# Patient Record
Sex: Male | Born: 1935 | Race: White | Hispanic: No | Marital: Married | Smoking: Former smoker
Health system: Southern US, Community
[De-identification: ages and names within clinical notes are randomized; demographics above are authoritative.]

## PROBLEM LIST (undated history)

## (undated) DIAGNOSIS — K635 Polyp of colon: Secondary | ICD-10-CM

## (undated) DIAGNOSIS — R053 Chronic cough: Secondary | ICD-10-CM

## (undated) DIAGNOSIS — K279 Peptic ulcer, site unspecified, unspecified as acute or chronic, without hemorrhage or perforation: Secondary | ICD-10-CM

## (undated) DIAGNOSIS — K219 Gastro-esophageal reflux disease without esophagitis: Secondary | ICD-10-CM

## (undated) DIAGNOSIS — K2270 Barrett's esophagus without dysplasia: Secondary | ICD-10-CM

## (undated) DIAGNOSIS — K5790 Diverticulosis of intestine, part unspecified, without perforation or abscess without bleeding: Secondary | ICD-10-CM

## (undated) DIAGNOSIS — N183 Chronic kidney disease, stage 3 unspecified: Secondary | ICD-10-CM

## (undated) DIAGNOSIS — J449 Chronic obstructive pulmonary disease, unspecified: Secondary | ICD-10-CM

## (undated) DIAGNOSIS — E875 Hyperkalemia: Secondary | ICD-10-CM

## (undated) DIAGNOSIS — I2510 Atherosclerotic heart disease of native coronary artery without angina pectoris: Secondary | ICD-10-CM

## (undated) DIAGNOSIS — Z8679 Personal history of other diseases of the circulatory system: Secondary | ICD-10-CM

## (undated) DIAGNOSIS — J189 Pneumonia, unspecified organism: Secondary | ICD-10-CM

## (undated) DIAGNOSIS — E785 Hyperlipidemia, unspecified: Secondary | ICD-10-CM

## (undated) DIAGNOSIS — I10 Essential (primary) hypertension: Secondary | ICD-10-CM

## (undated) DIAGNOSIS — R05 Cough: Secondary | ICD-10-CM

## (undated) HISTORY — DX: Hyperkalemia: E87.5

## (undated) HISTORY — PX: CARDIAC SURGERY: SHX584

## (undated) HISTORY — DX: Peptic ulcer, site unspecified, unspecified as acute or chronic, without hemorrhage or perforation: K27.9

## (undated) HISTORY — DX: Chronic cough: R05.3

## (undated) HISTORY — DX: Polyp of colon: K63.5

## (undated) HISTORY — DX: Gastro-esophageal reflux disease without esophagitis: K21.9

## (undated) HISTORY — DX: Hyperlipidemia, unspecified: E78.5

## (undated) HISTORY — DX: Chronic obstructive pulmonary disease, unspecified: J44.9

## (undated) HISTORY — DX: Diverticulosis of intestine, part unspecified, without perforation or abscess without bleeding: K57.90

## (undated) HISTORY — DX: Atherosclerotic heart disease of native coronary artery without angina pectoris: I25.10

## (undated) HISTORY — DX: Personal history of other diseases of the circulatory system: Z86.79

## (undated) HISTORY — DX: Chronic kidney disease, stage 3 (moderate): N18.3

## (undated) HISTORY — DX: Chronic kidney disease, stage 3 unspecified: N18.30

## (undated) HISTORY — DX: Barrett's esophagus without dysplasia: K22.70

## (undated) HISTORY — DX: Pneumonia, unspecified organism: J18.9

## (undated) HISTORY — DX: Cough: R05

## (undated) HISTORY — PX: LAPAROSCOPIC GASTROTOMY W/ REPAIR OF ULCER: SUR772

## (undated) HISTORY — PX: COLON SURGERY: SHX602

---

## 2001-11-03 ENCOUNTER — Ambulatory Visit (HOSPITAL_COMMUNITY): Admission: RE | Admit: 2001-11-03 | Discharge: 2001-11-03 | Payer: Self-pay | Admitting: Internal Medicine

## 2004-03-30 ENCOUNTER — Ambulatory Visit (HOSPITAL_COMMUNITY): Admission: RE | Admit: 2004-03-30 | Discharge: 2004-03-30 | Payer: Self-pay | Admitting: Internal Medicine

## 2004-08-04 ENCOUNTER — Ambulatory Visit (HOSPITAL_COMMUNITY): Admission: RE | Admit: 2004-08-04 | Discharge: 2004-08-04 | Payer: Self-pay | Admitting: Family Medicine

## 2004-08-11 ENCOUNTER — Ambulatory Visit (HOSPITAL_COMMUNITY): Admission: RE | Admit: 2004-08-11 | Discharge: 2004-08-11 | Payer: Self-pay | Admitting: Family Medicine

## 2004-08-31 ENCOUNTER — Ambulatory Visit (HOSPITAL_COMMUNITY): Admission: RE | Admit: 2004-08-31 | Discharge: 2004-08-31 | Payer: Self-pay | Admitting: Pulmonary Disease

## 2005-09-30 ENCOUNTER — Ambulatory Visit: Payer: Self-pay | Admitting: Internal Medicine

## 2005-10-22 ENCOUNTER — Ambulatory Visit: Payer: Self-pay | Admitting: Internal Medicine

## 2005-10-22 ENCOUNTER — Encounter: Payer: Self-pay | Admitting: Internal Medicine

## 2005-10-22 ENCOUNTER — Ambulatory Visit (HOSPITAL_COMMUNITY): Admission: RE | Admit: 2005-10-22 | Discharge: 2005-10-22 | Payer: Self-pay | Admitting: Internal Medicine

## 2007-09-04 ENCOUNTER — Ambulatory Visit (HOSPITAL_COMMUNITY): Admission: RE | Admit: 2007-09-04 | Discharge: 2007-09-04 | Payer: Self-pay | Admitting: Internal Medicine

## 2007-09-04 ENCOUNTER — Encounter: Payer: Self-pay | Admitting: Internal Medicine

## 2007-09-04 ENCOUNTER — Ambulatory Visit: Payer: Self-pay | Admitting: Internal Medicine

## 2007-12-22 ENCOUNTER — Encounter: Payer: Self-pay | Admitting: Internal Medicine

## 2007-12-22 ENCOUNTER — Ambulatory Visit (HOSPITAL_COMMUNITY): Admission: RE | Admit: 2007-12-22 | Discharge: 2007-12-22 | Payer: Self-pay | Admitting: Internal Medicine

## 2007-12-22 ENCOUNTER — Ambulatory Visit: Payer: Self-pay | Admitting: Internal Medicine

## 2008-02-07 ENCOUNTER — Ambulatory Visit (HOSPITAL_COMMUNITY): Admission: RE | Admit: 2008-02-07 | Discharge: 2008-02-07 | Payer: Self-pay | Admitting: Family Medicine

## 2008-02-13 ENCOUNTER — Encounter: Payer: Self-pay | Admitting: Cardiology

## 2008-02-13 ENCOUNTER — Ambulatory Visit (HOSPITAL_COMMUNITY): Admission: RE | Admit: 2008-02-13 | Discharge: 2008-02-13 | Payer: Self-pay | Admitting: Family Medicine

## 2008-02-13 ENCOUNTER — Encounter: Payer: Self-pay | Admitting: Pulmonary Disease

## 2008-03-18 HISTORY — PX: CORONARY ARTERY BYPASS GRAFT: SHX141

## 2008-04-10 ENCOUNTER — Encounter: Payer: Self-pay | Admitting: Cardiology

## 2008-04-10 ENCOUNTER — Inpatient Hospital Stay (HOSPITAL_COMMUNITY): Admission: AD | Admit: 2008-04-10 | Discharge: 2008-04-21 | Payer: Self-pay | Admitting: Cardiology

## 2008-04-10 ENCOUNTER — Ambulatory Visit: Payer: Self-pay | Admitting: Thoracic Surgery (Cardiothoracic Vascular Surgery)

## 2008-04-10 ENCOUNTER — Ambulatory Visit: Payer: Self-pay | Admitting: Emergency Medicine

## 2008-04-11 ENCOUNTER — Encounter: Payer: Self-pay | Admitting: Thoracic Surgery (Cardiothoracic Vascular Surgery)

## 2008-04-11 ENCOUNTER — Encounter: Payer: Self-pay | Admitting: Cardiology

## 2008-04-12 ENCOUNTER — Encounter: Payer: Self-pay | Admitting: Thoracic Surgery (Cardiothoracic Vascular Surgery)

## 2008-05-06 ENCOUNTER — Ambulatory Visit: Payer: Self-pay | Admitting: Thoracic Surgery (Cardiothoracic Vascular Surgery)

## 2008-05-06 ENCOUNTER — Encounter
Admission: RE | Admit: 2008-05-06 | Discharge: 2008-05-06 | Payer: Self-pay | Admitting: Thoracic Surgery (Cardiothoracic Vascular Surgery)

## 2008-05-21 ENCOUNTER — Emergency Department (HOSPITAL_COMMUNITY): Admission: EM | Admit: 2008-05-21 | Discharge: 2008-05-21 | Payer: Self-pay | Admitting: Emergency Medicine

## 2008-05-27 ENCOUNTER — Encounter
Admission: RE | Admit: 2008-05-27 | Discharge: 2008-05-27 | Payer: Self-pay | Admitting: Thoracic Surgery (Cardiothoracic Vascular Surgery)

## 2008-05-27 ENCOUNTER — Ambulatory Visit: Payer: Self-pay | Admitting: Thoracic Surgery (Cardiothoracic Vascular Surgery)

## 2008-06-17 ENCOUNTER — Encounter (HOSPITAL_COMMUNITY): Admission: RE | Admit: 2008-06-17 | Discharge: 2008-07-17 | Payer: Self-pay | Admitting: *Deleted

## 2008-07-18 ENCOUNTER — Encounter (HOSPITAL_COMMUNITY): Admission: RE | Admit: 2008-07-18 | Discharge: 2008-08-17 | Payer: Self-pay | Admitting: *Deleted

## 2008-08-19 ENCOUNTER — Encounter (HOSPITAL_COMMUNITY): Admission: RE | Admit: 2008-08-19 | Discharge: 2008-09-18 | Payer: Self-pay | Admitting: *Deleted

## 2008-10-28 ENCOUNTER — Ambulatory Visit (HOSPITAL_COMMUNITY): Admission: RE | Admit: 2008-10-28 | Discharge: 2008-10-28 | Payer: Self-pay | Admitting: Nephrology

## 2008-10-28 ENCOUNTER — Ambulatory Visit (HOSPITAL_COMMUNITY): Admission: RE | Admit: 2008-10-28 | Discharge: 2008-10-28 | Payer: Self-pay | Admitting: Family Medicine

## 2008-10-28 ENCOUNTER — Encounter: Payer: Self-pay | Admitting: Pulmonary Disease

## 2008-11-25 ENCOUNTER — Ambulatory Visit (HOSPITAL_COMMUNITY): Admission: RE | Admit: 2008-11-25 | Discharge: 2008-11-25 | Payer: Self-pay | Admitting: Nephrology

## 2008-12-23 ENCOUNTER — Ambulatory Visit: Payer: Self-pay | Admitting: Internal Medicine

## 2009-01-06 ENCOUNTER — Ambulatory Visit: Payer: Self-pay | Admitting: Internal Medicine

## 2009-01-06 ENCOUNTER — Ambulatory Visit (HOSPITAL_COMMUNITY): Admission: RE | Admit: 2009-01-06 | Discharge: 2009-01-06 | Payer: Self-pay | Admitting: Internal Medicine

## 2009-01-06 ENCOUNTER — Encounter: Payer: Self-pay | Admitting: Internal Medicine

## 2009-01-07 ENCOUNTER — Encounter: Payer: Self-pay | Admitting: Internal Medicine

## 2009-01-24 ENCOUNTER — Encounter: Payer: Self-pay | Admitting: Internal Medicine

## 2009-02-14 ENCOUNTER — Ambulatory Visit: Payer: Self-pay | Admitting: Pulmonary Disease

## 2009-02-14 DIAGNOSIS — R053 Chronic cough: Secondary | ICD-10-CM | POA: Insufficient documentation

## 2009-02-14 DIAGNOSIS — E785 Hyperlipidemia, unspecified: Secondary | ICD-10-CM

## 2009-02-14 DIAGNOSIS — R05 Cough: Secondary | ICD-10-CM | POA: Insufficient documentation

## 2009-02-14 DIAGNOSIS — J449 Chronic obstructive pulmonary disease, unspecified: Secondary | ICD-10-CM

## 2009-02-14 DIAGNOSIS — I10 Essential (primary) hypertension: Secondary | ICD-10-CM | POA: Insufficient documentation

## 2009-02-18 ENCOUNTER — Telehealth (INDEPENDENT_AMBULATORY_CARE_PROVIDER_SITE_OTHER): Payer: Self-pay | Admitting: *Deleted

## 2009-03-10 ENCOUNTER — Ambulatory Visit: Payer: Self-pay | Admitting: Pulmonary Disease

## 2009-03-11 ENCOUNTER — Ambulatory Visit: Payer: Self-pay | Admitting: Pulmonary Disease

## 2009-06-02 IMAGING — CR DG CHEST 1V PORT
1 series · 1 of 1 positions shown · non-contrast
Comparison: Yesterday.

CLINICAL DATA: Status post CABG.

PORTABLE CHEST - 1 VIEW

[AP]
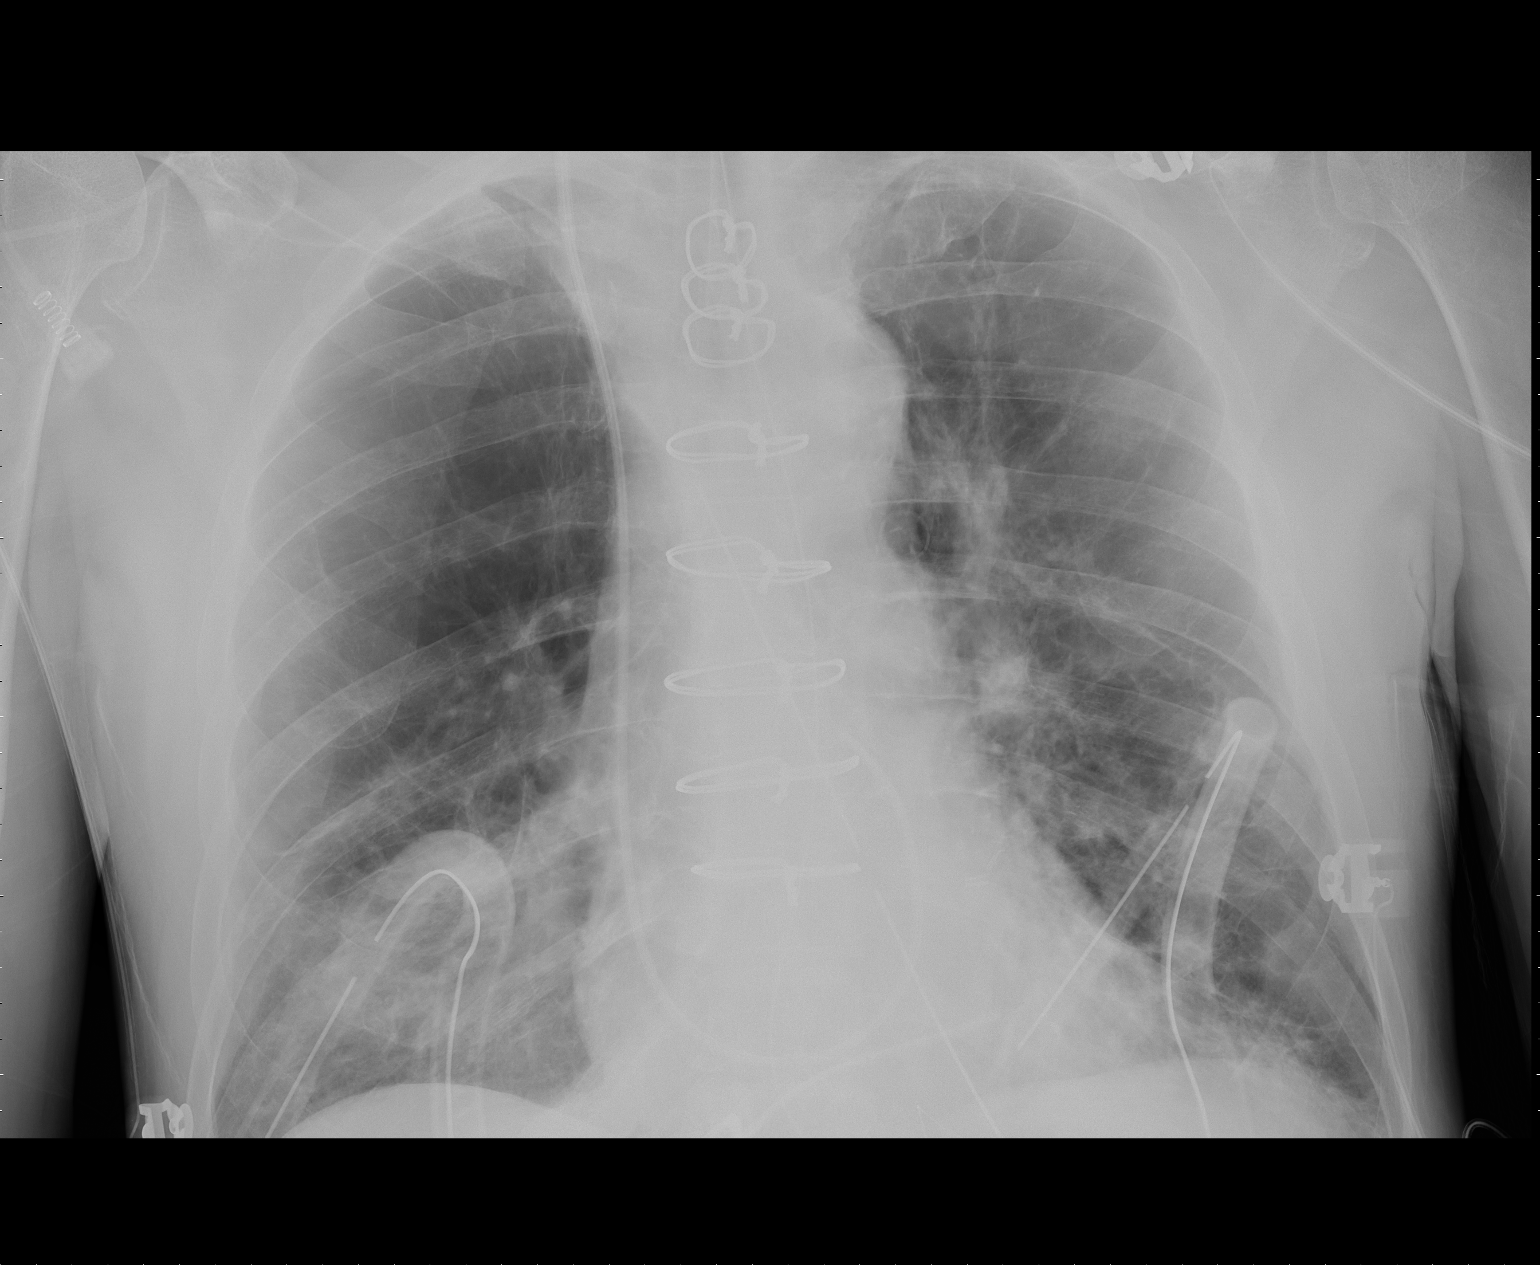

[1 of 1 positions shown; findings below may reference images not displayed]

FINDINGS: Interval post CABG changes with mediastinal and bilateral
chest tubes.  No pneumothorax.  Endotracheal tube in satisfactory
position.  Right jugular Swan-Ganz catheter tip in the region of
the pulmonary outflow tract.  Mild bibasilar atelectasis.  Stable
changes of COPD.  Diffuse osteopenia.  Nasogastric tube extending
into the stomach.
IMPRESSION: 1.  Status post CABG with no complicating features.
2.  Mild bibasilar atelectasis.
3.  Stable changes of COPD.

## 2009-06-05 IMAGING — CR DG CHEST 2V
2 series · 2 of 2 positions shown · non-contrast
Comparison: Portable chest of 04/14/2008

CLINICAL DATA: Short of breath, coronary artery disease

CHEST - 2 VIEW

[w chest pa]
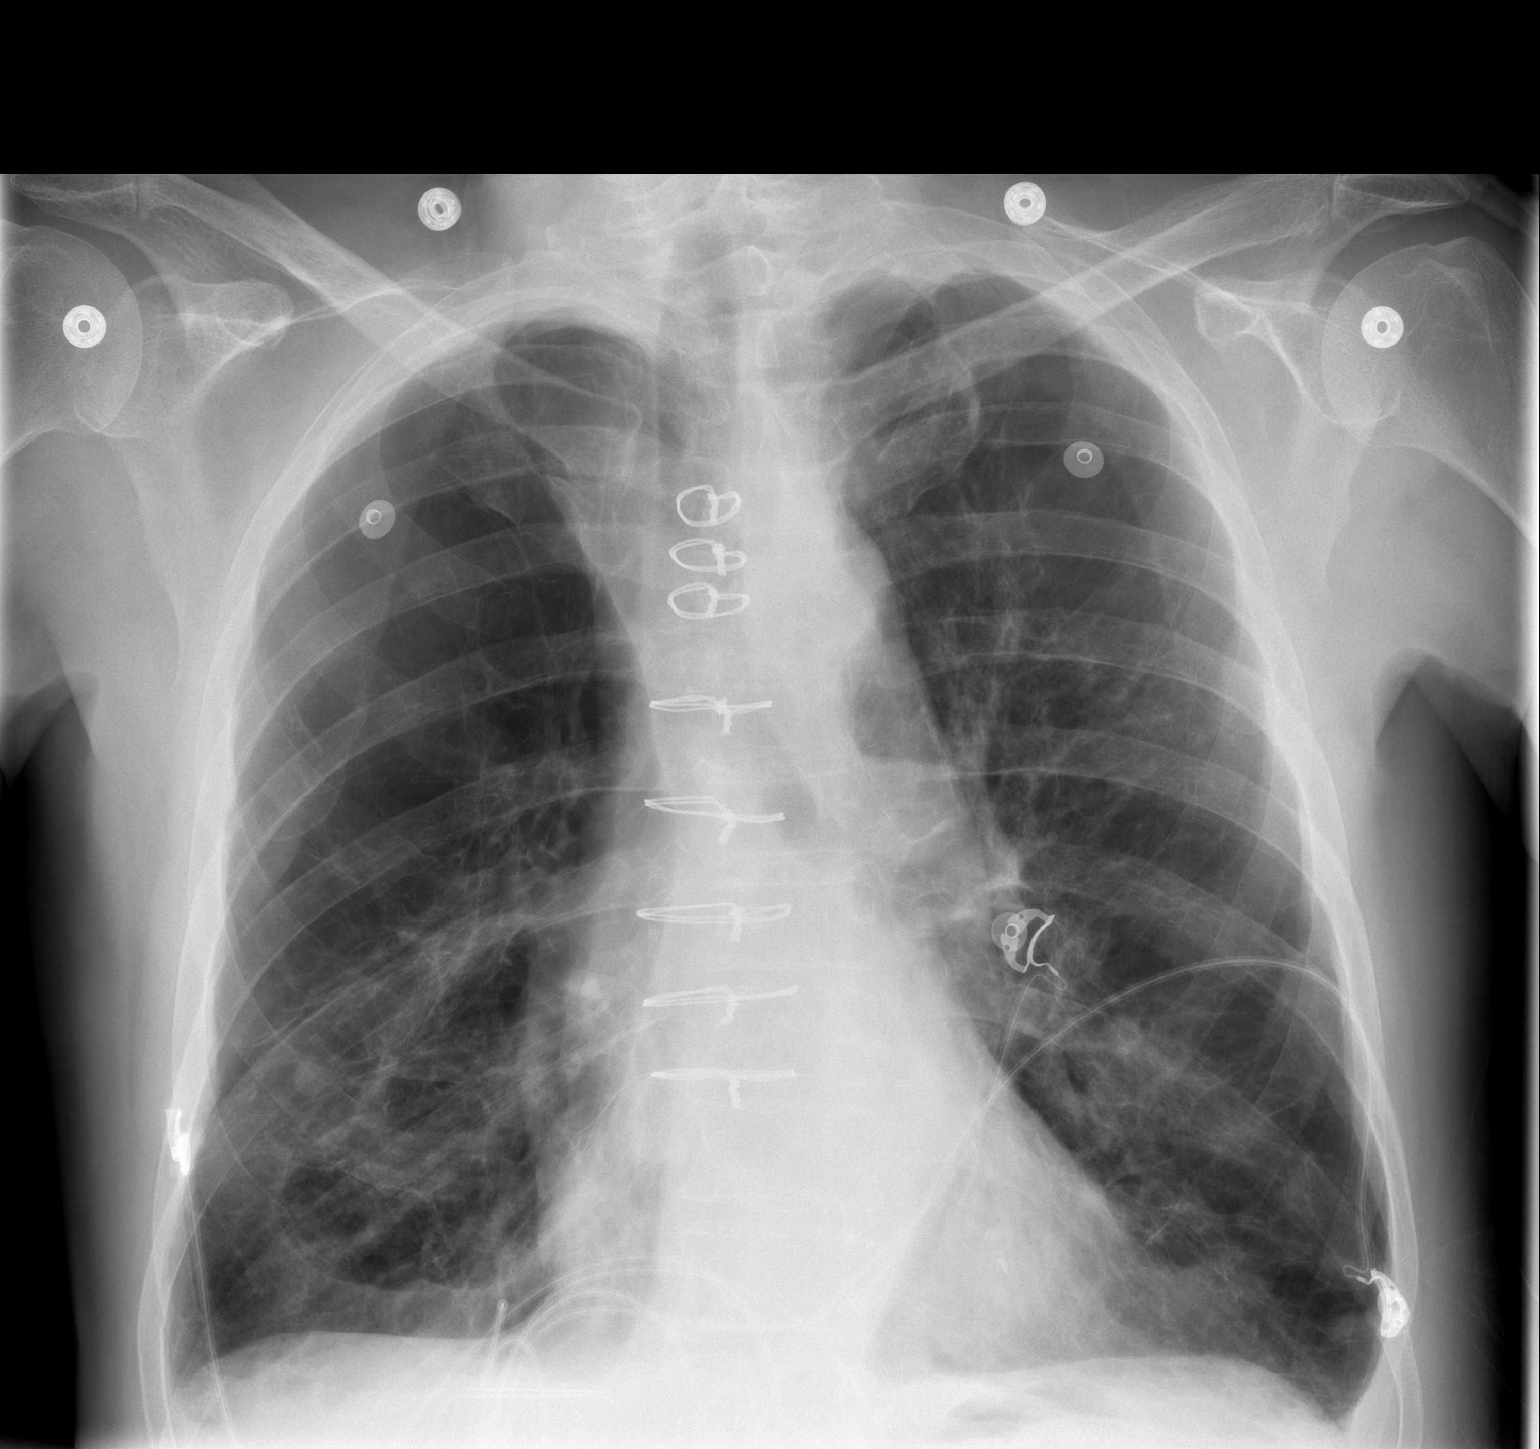

[w chest lat]
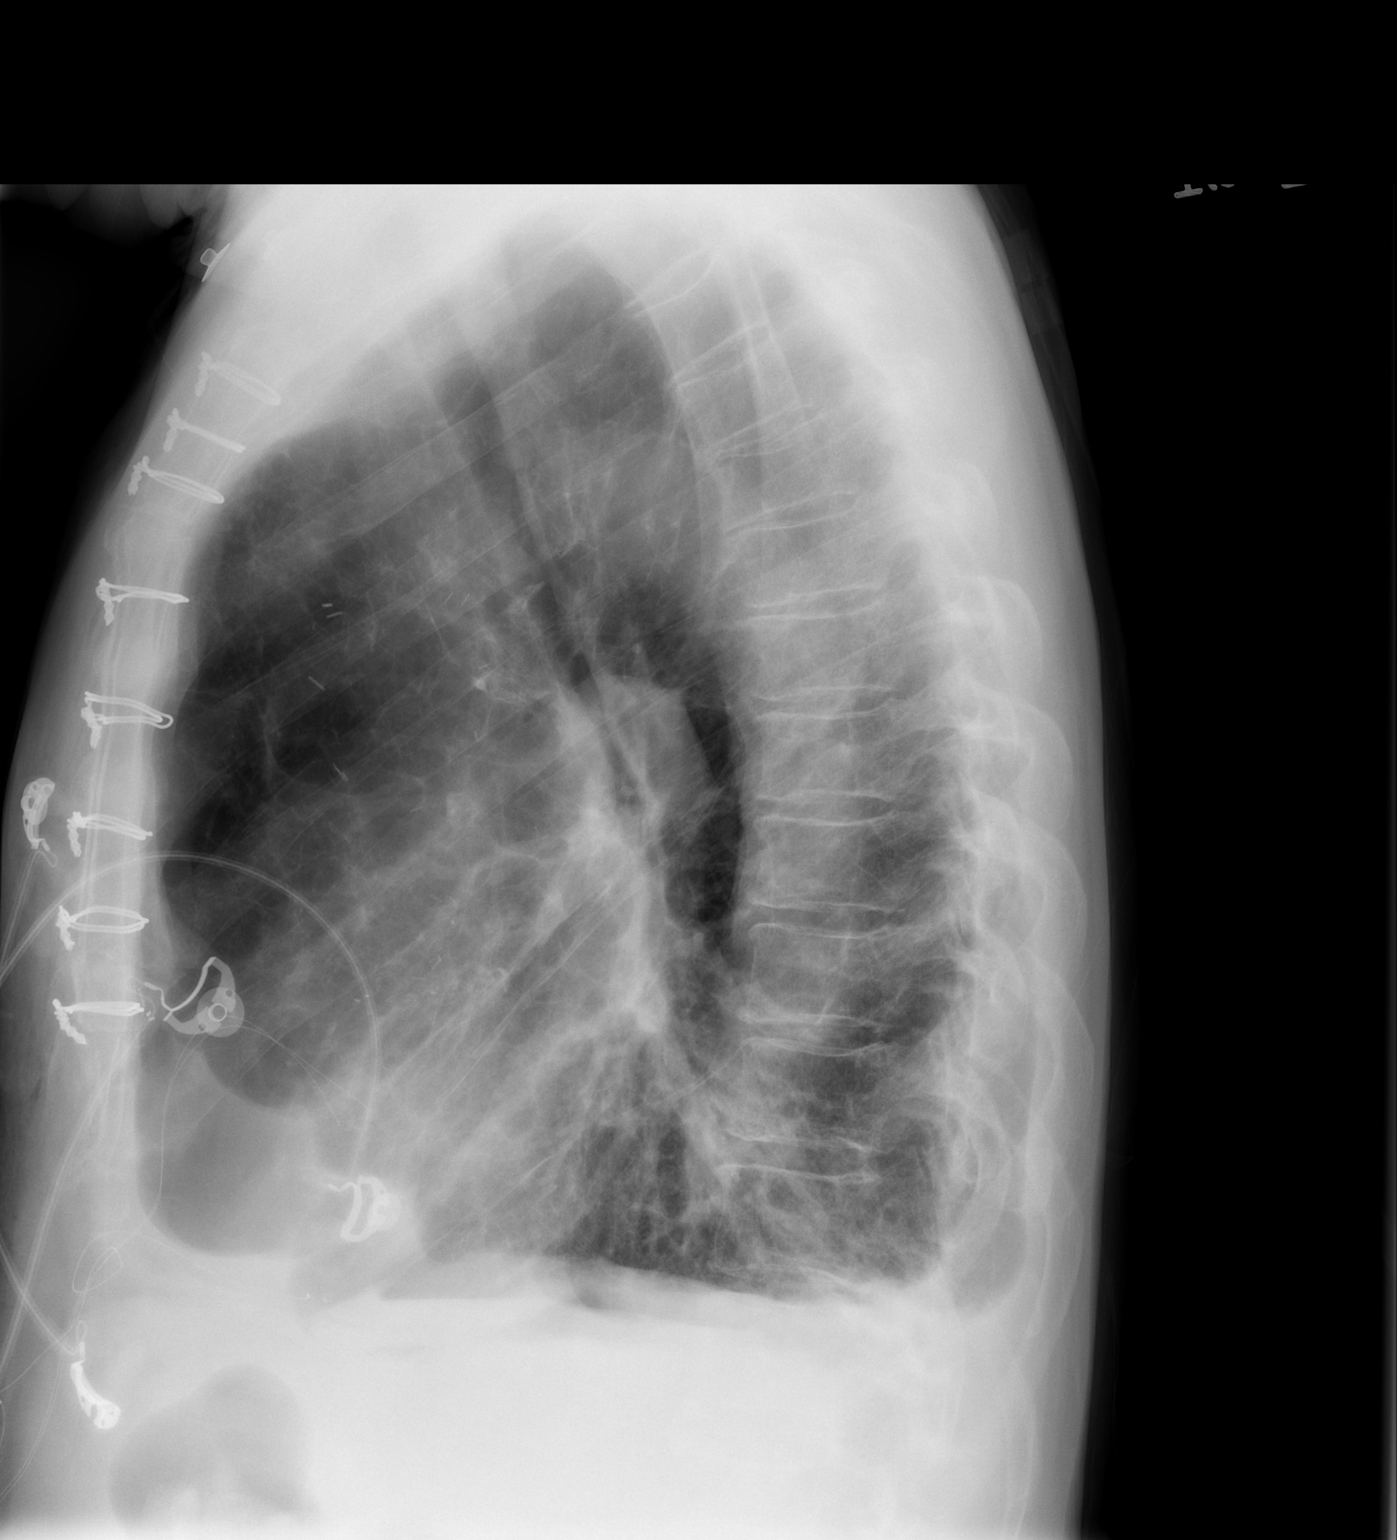

[2 of 2 positions shown; findings below may reference images not displayed]

FINDINGS: The lungs remain hyperaerated consistent with COPD.
However there are small pleural effusions present and an element of
mild congestive heart failure is a consideration.  The heart is
within upper limits of normal.  Median sternotomy sutures are noted
from prior CABG.
IMPRESSION: 1.  COPD.
2.  Small pleural effusions.  Question mild CHF.

## 2009-06-26 IMAGING — CR DG CHEST 2V
2 series · 2 of 2 positions shown · non-contrast
Comparison: 04/15/2008

CLINICAL DATA: Post CABG/short of breath

CHEST - 2 VIEW

[view not recorded (1 of 2)]
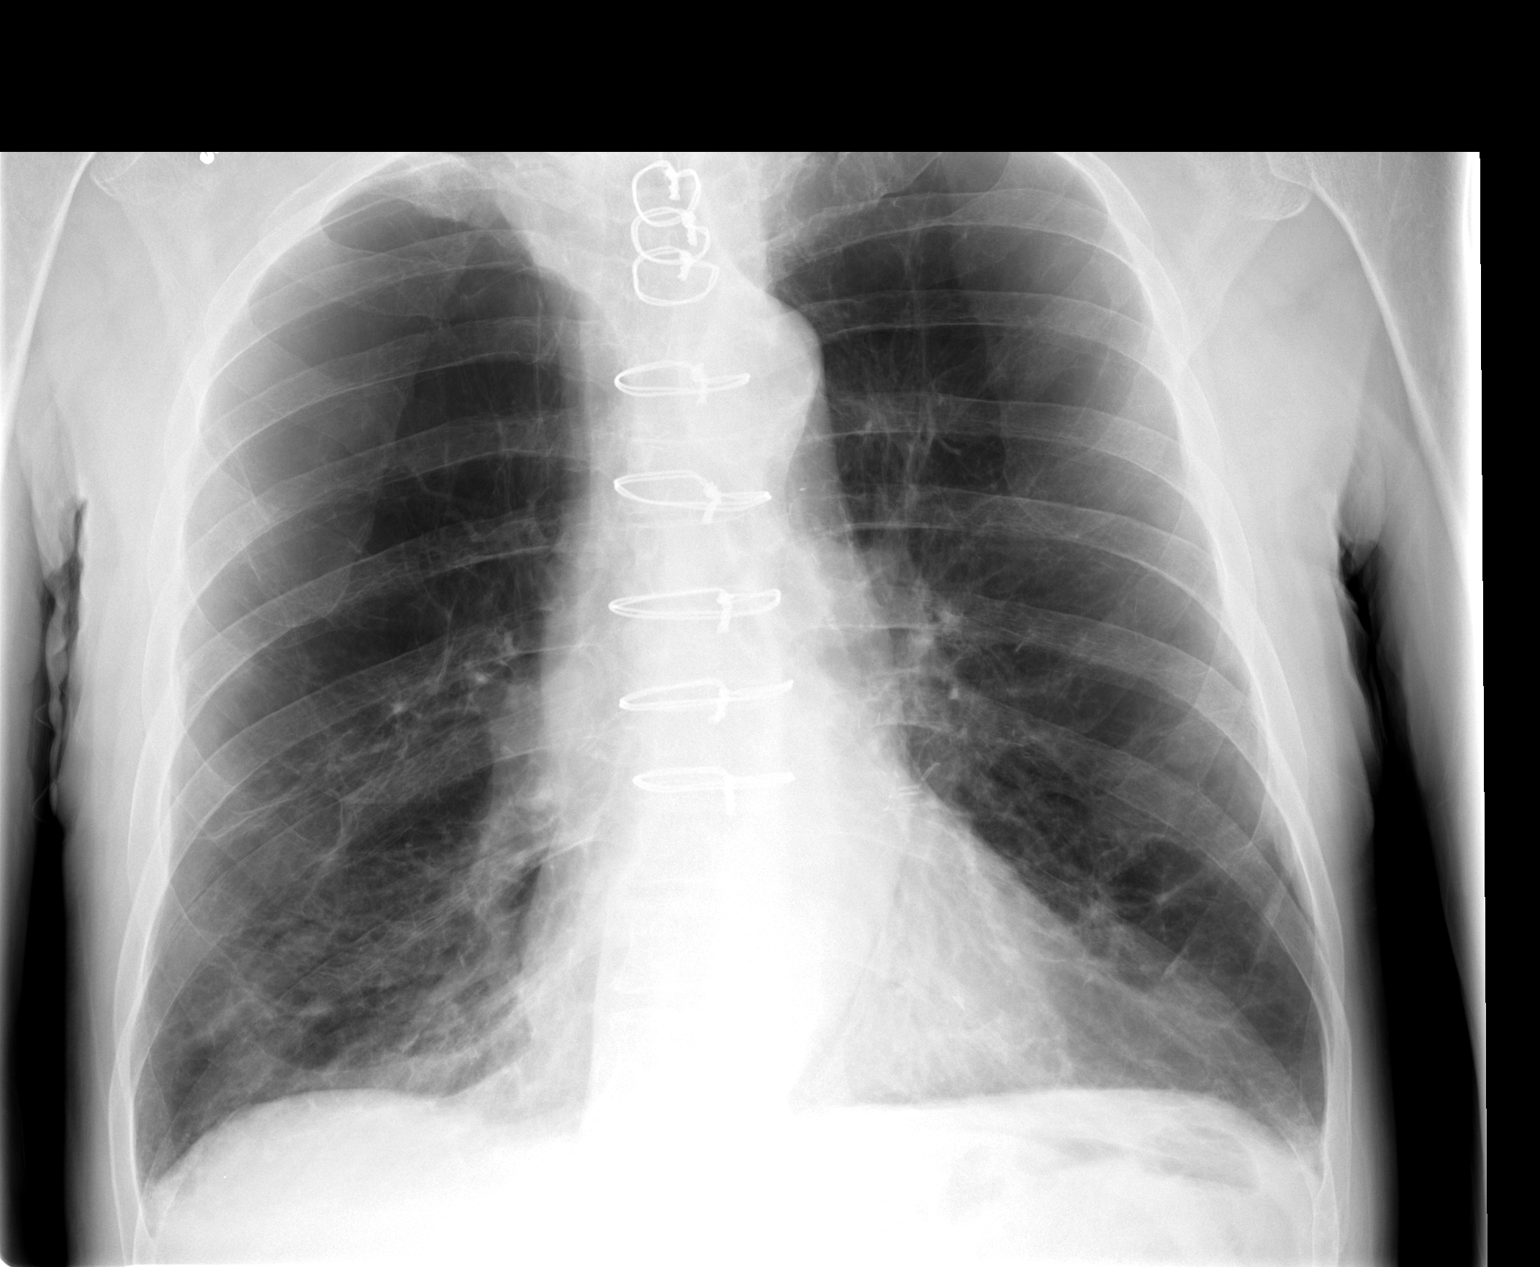

[view not recorded (2 of 2)]
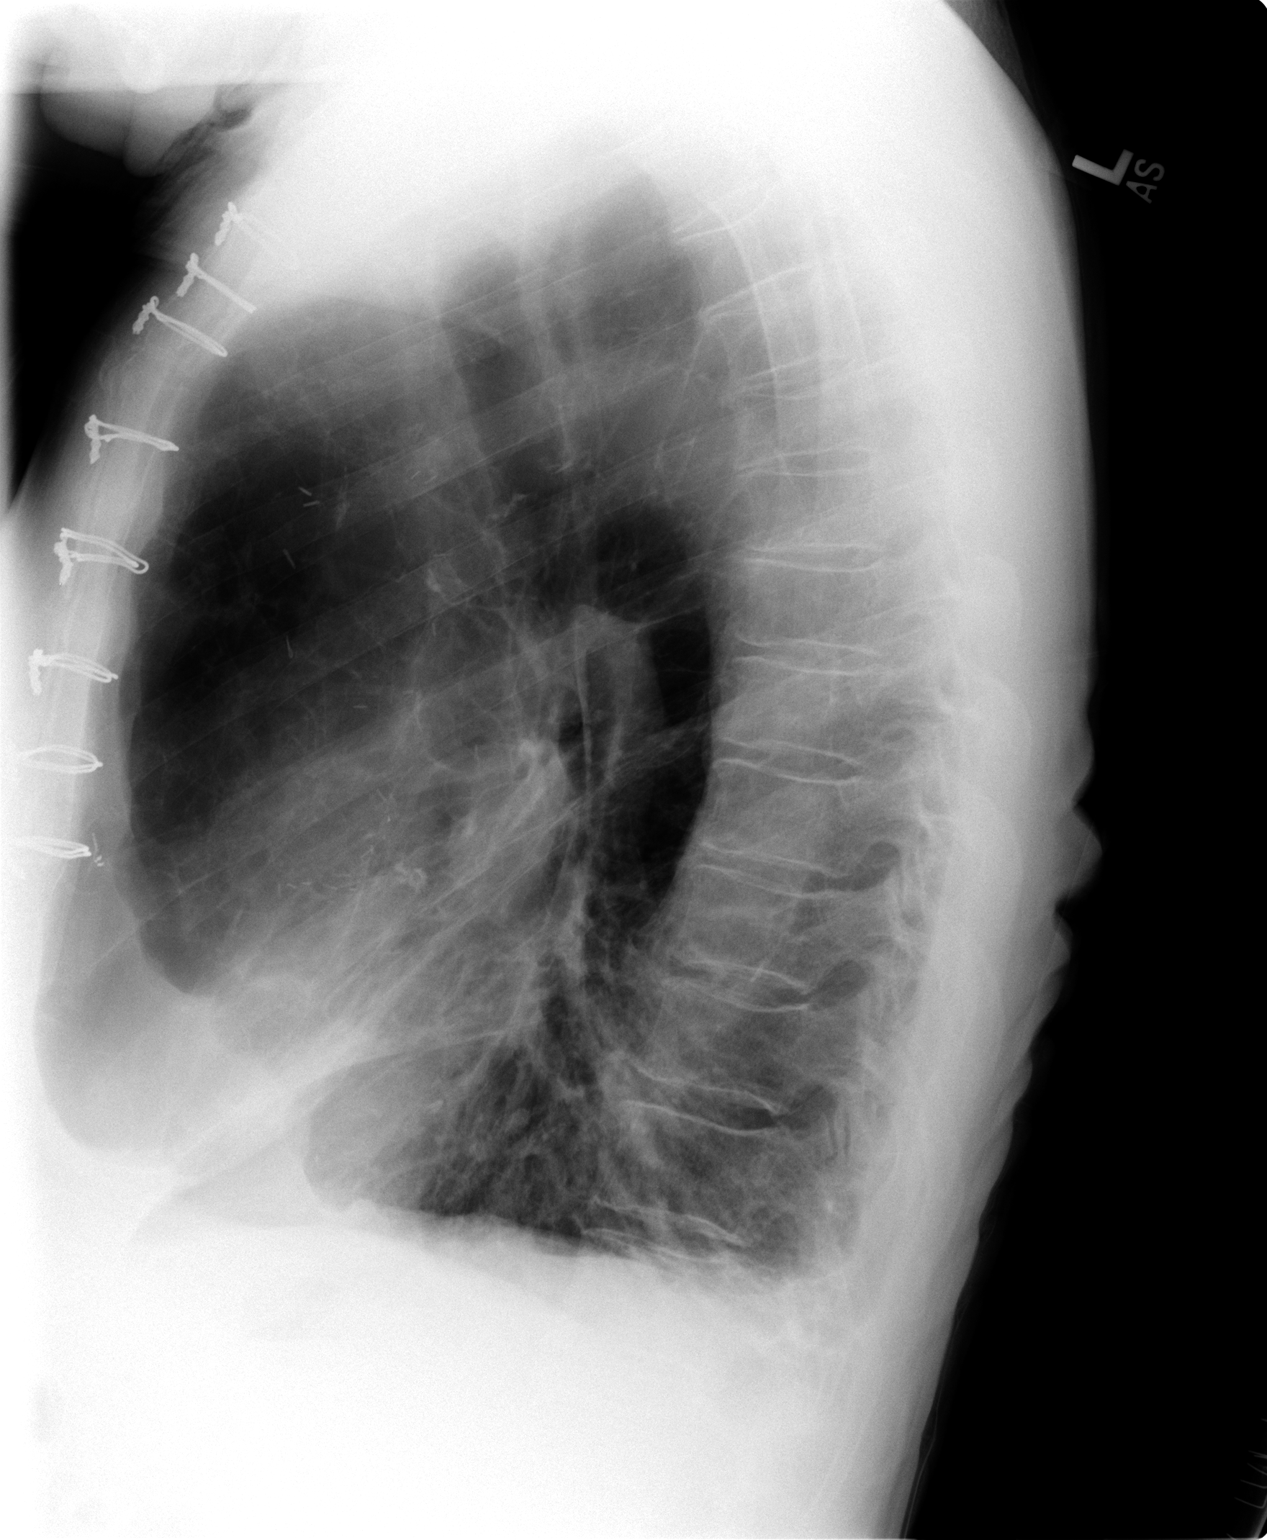

[2 of 2 positions shown; findings below may reference images not displayed]

FINDINGS: COPD as before.  No definite pneumonia but there is a
question of a subtle density in the right cardiophrenic angle on
the PA view.  On the lateral view there appears to be some slight
increased density overlying what might be the right middle lobe.
Cannot rule out subtle pneumonia.

No heart failure.  Prior median sternotomy.
IMPRESSION: 1.  COPD.
2.  Cannot exclude a subtle right middle or right lower lobe
pneumonia.
3.  No congestive heart failure - post CABG.

## 2009-07-11 ENCOUNTER — Ambulatory Visit: Payer: Self-pay | Admitting: Pulmonary Disease

## 2009-09-22 ENCOUNTER — Ambulatory Visit (HOSPITAL_COMMUNITY): Admission: RE | Admit: 2009-09-22 | Discharge: 2009-09-22 | Payer: Self-pay | Admitting: Family Medicine

## 2009-10-01 ENCOUNTER — Emergency Department (HOSPITAL_COMMUNITY): Admission: EM | Admit: 2009-10-01 | Discharge: 2009-10-01 | Payer: Self-pay | Admitting: Emergency Medicine

## 2009-10-04 ENCOUNTER — Emergency Department (HOSPITAL_COMMUNITY): Admission: EM | Admit: 2009-10-04 | Discharge: 2009-10-05 | Payer: Self-pay | Admitting: Emergency Medicine

## 2009-10-20 ENCOUNTER — Encounter: Payer: Self-pay | Admitting: Gastroenterology

## 2009-10-22 ENCOUNTER — Ambulatory Visit: Payer: Self-pay | Admitting: Pulmonary Disease

## 2009-10-22 DIAGNOSIS — J209 Acute bronchitis, unspecified: Secondary | ICD-10-CM

## 2009-11-21 ENCOUNTER — Ambulatory Visit: Payer: Self-pay | Admitting: Internal Medicine

## 2009-12-01 ENCOUNTER — Telehealth (INDEPENDENT_AMBULATORY_CARE_PROVIDER_SITE_OTHER): Payer: Self-pay | Admitting: *Deleted

## 2010-01-05 ENCOUNTER — Telehealth (INDEPENDENT_AMBULATORY_CARE_PROVIDER_SITE_OTHER): Payer: Self-pay | Admitting: *Deleted

## 2010-01-12 ENCOUNTER — Encounter: Payer: Self-pay | Admitting: Internal Medicine

## 2010-01-20 ENCOUNTER — Ambulatory Visit: Payer: Self-pay | Admitting: Pulmonary Disease

## 2010-01-21 ENCOUNTER — Ambulatory Visit (HOSPITAL_COMMUNITY): Admission: RE | Admit: 2010-01-21 | Discharge: 2010-01-21 | Payer: Self-pay | Admitting: Internal Medicine

## 2010-01-21 ENCOUNTER — Ambulatory Visit: Payer: Self-pay | Admitting: Internal Medicine

## 2010-01-23 ENCOUNTER — Encounter: Payer: Self-pay | Admitting: Internal Medicine

## 2010-02-18 ENCOUNTER — Telehealth: Payer: Self-pay | Admitting: Pulmonary Disease

## 2010-02-19 ENCOUNTER — Telehealth: Payer: Self-pay | Admitting: Pulmonary Disease

## 2010-02-20 ENCOUNTER — Encounter: Payer: Self-pay | Admitting: Pulmonary Disease

## 2010-02-20 ENCOUNTER — Telehealth (INDEPENDENT_AMBULATORY_CARE_PROVIDER_SITE_OTHER): Payer: Self-pay | Admitting: *Deleted

## 2010-02-21 ENCOUNTER — Encounter: Payer: Self-pay | Admitting: Pulmonary Disease

## 2010-02-24 ENCOUNTER — Telehealth (INDEPENDENT_AMBULATORY_CARE_PROVIDER_SITE_OTHER): Payer: Self-pay | Admitting: *Deleted

## 2010-03-11 ENCOUNTER — Ambulatory Visit: Payer: Self-pay | Admitting: Pulmonary Disease

## 2010-05-25 ENCOUNTER — Ambulatory Visit: Payer: Self-pay | Admitting: Pulmonary Disease

## 2010-06-29 ENCOUNTER — Emergency Department (HOSPITAL_COMMUNITY)
Admission: EM | Admit: 2010-06-29 | Discharge: 2010-06-30 | Payer: Self-pay | Source: Home / Self Care | Admitting: Emergency Medicine

## 2010-07-01 ENCOUNTER — Encounter: Payer: Self-pay | Admitting: Cardiology

## 2010-07-27 ENCOUNTER — Ambulatory Visit: Payer: Self-pay | Admitting: Pulmonary Disease

## 2010-08-11 ENCOUNTER — Telehealth (INDEPENDENT_AMBULATORY_CARE_PROVIDER_SITE_OTHER): Payer: Self-pay | Admitting: *Deleted

## 2010-08-11 ENCOUNTER — Encounter: Payer: Self-pay | Admitting: Pulmonary Disease

## 2010-08-18 ENCOUNTER — Encounter: Payer: Self-pay | Admitting: Pulmonary Disease

## 2010-09-07 ENCOUNTER — Encounter: Payer: Self-pay | Admitting: Pulmonary Disease

## 2010-10-24 ENCOUNTER — Encounter: Payer: Self-pay | Admitting: Pulmonary Disease

## 2010-10-27 ENCOUNTER — Telehealth: Payer: Self-pay | Admitting: Pulmonary Disease

## 2010-11-04 ENCOUNTER — Telehealth: Payer: Self-pay | Admitting: Pulmonary Disease

## 2010-11-09 ENCOUNTER — Encounter: Payer: Self-pay | Admitting: Surgery

## 2010-11-16 ENCOUNTER — Ambulatory Visit
Admission: RE | Admit: 2010-11-16 | Discharge: 2010-11-16 | Payer: Self-pay | Source: Home / Self Care | Attending: Pulmonary Disease | Admitting: Pulmonary Disease

## 2010-11-17 ENCOUNTER — Telehealth: Payer: Self-pay | Admitting: Adult Health

## 2010-11-17 NOTE — Progress Notes (Signed)
Summary: requesting O2 at home  Phone Note Call from Patient Call back at Home Phone [phone] Call back at [phone]   Caller: Spouse Call For: [name] Summary of Call: pt's spouse states that pt has been having SOB. says while he's better today, she wants to know if dr Vassie Loll would approve an order for an O2 tank to have "on hand" when pt needs this. says pt had this before (after surgery) at home and it was a great help. i offered an appt w/ RA for this am but pt couldn't come in today per spouse. call judy [place] at home # Initial call taken by: Tivis Ringer, CNA,  Feb 18, 2010 9:09 AM  Follow-up for Phone Call        Pt wife states pt has been having increased SOB with rest and exertion "for a while now."  She denies any incresed congestion or cough just SOB. She is requesting that an order be placed for oxygen for pt to use as needed. I advised pt may need to come in to be qualified for oxygen. She states understanding but that pt cannot come in today. Pelase advise. Carron Curie CMA  Feb 18, 2010 10:05 AM   Additional Follow-up for Phone Call Additional follow up Details #1::        will ask DME to perform O2 evaluation & order O2 if qualifies. Additional Follow-up by: Comer Locket. Vassie Loll MD,  Feb 18, 2010 11:05 AM    Additional Follow-up for Phone Call Additional follow up Details #2::    Called and spoke with pt's wife. She stated that when pt was on oxygen before he was with [facility] and that is where she would like order sent. Order sent to [facility] for ono. Wife is aware that [facility] will contact her to arrange for this study to be done. Rhonda Cobb  Feb 18, 2010 11:14 AM

## 2010-11-17 NOTE — Assessment & Plan Note (Signed)
Summary: rov ///kp   Visit Type:  Follow-up Copy to:  self Primary Provider/Referring Provider:  cresenzo  CC:  Pt here for follow up.  Pt request new Rx for ProAir.  History of Present Illness: 72/M, ex- smokerfor FU of  severe  COPD. He developed a  chronic cough.since his heart surgery by [name] in 6/09. Cough drops & cough syrups only give temporary relief. Albuterol nebs & MDI are not very helpful. Barrett's esophagus 3 cm noted to be stable on recent EGD by [name]. He is seeing a nephrologist for high potassium, no ACE (-) on med list.  H/o sinus surgery, tonsilectomy CXR 8/09 bibasal atelectasis with pleural calcification rt diaphragm, severe COPD. CT chest 4/09 severe emphysema, scattered sub-cm nodules stable since 2005  5/10 >> cough almost gone! dyspnea persists PFTs >> severe obstruction, FEV1 39%, no BD response  July 11, 2009--Returns for 4 month follow up - states breathing has "has  good days and bad days"-states is using the symbicort every morning.   He is able to mow grass-rides lawnmower and still  weedeats- does rest for few minutes .   October 22, 2009 2:30 PM  flu like symptoms around [place] time. CXR was wnl, treated with avelox. Still c/o yellow phlegm, no wheezing. Denies chest pain,  orthopnea, hemoptysis, fever, n/v/d, edema, headache.    Current Medications (verified): 1)  Mucinex Maximum Strength 1200 Mg Xr12h-Tab (Guaifenesin) .... As Needed 2)  Furosemide 20 Mg Tabs (Furosemide) .... Once Daily 3)  Metoprolol Tartrate 25 Mg Tabs (Metoprolol Tartrate) .... Two Times A Day 4)  Omeprazole 20 Mg Cpdr (Omeprazole) .... One By Mouth 30 Mins Before Breakfast Daily 5)  Simvastatin 80 Mg Tabs (Simvastatin) .... At Bedtime 6)  Bayer Aspirin 325 Mg Tabs (Aspirin) .Marland Kitchen.. 1 Once Daily 7)  Proair Hfa 108 (90 Base) Mcg/act Aers (Albuterol Sulfate) .... Inhale 2 Puffs Every Four Hours As Needed 8)  Symbicort 80-4.5 Mcg/act Aero (Budesonide-Formoterol  Fumarate) .... Inhale 2 Puffs Two Times A Day 9)  Zetia .... Take 1 Tablet By Mouth Once A Day 10)  Crestor 40 Mg Tabs (Rosuvastatin Calcium) .... Take 1 Tab By Mouth At Bedtime 11)  Alprazolam 0.5 Mg Tabs (Alprazolam) .... 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  The patient denies anorexia, fever, weight loss, weight gain, vision loss, decreased hearing, hoarseness, chest pain, syncope, peripheral edema, headaches, hemoptysis, abdominal pain, melena, hematochezia, severe indigestion/heartburn, hematuria, muscle weakness, suspicious skin lesions, difficulty walking, depression, unusual weight change, and abnormal bleeding.    Vital Signs:  Patient profile:   74 year old male Height:      71 inches Weight:      153 pounds O2 Sat:      93 % on Room air Temp:     97.7 degrees F oral Pulse rate:   74 / minute BP sitting:   140 / 80  (left arm) Cuff size:   regular  Vitals Entered By: Zackery Barefoot CMA (October 22, 2009 2:21 PM)  O2 Flow:  Room air CC: Pt here for follow up.  Pt request new Rx for ProAir  Comments Medications reviewed with patient Zackery Barefoot CMA  October 22, 2009 2:22 PM    Physical Exam  Additional Exam:  Gen. Pleasant, well-nourished, in no distress, normal affect ENT - no lesions, no post nasal drip Neck: No JVD, no thyromegaly, no carotid bruits Lungs: no use of accessory muscles, no dullness to percussion, decreased without rales or rhonchi  Cardiovascular: Rhythm regular, heart sounds  normal, no murmurs or gallops, no peripheral edema Musculoskeletal: No  deformities, no cyanosis or clubbing      Impression & Recommendations:  Problem # 1:  BRONCHITIS, ACUTE (ICD-466.0)  doxy for colored phlegm, NO bspasm at present His updated medication list for this problem includes:    Mucinex Maximum Strength 1200 Mg Xr12h-tab (Guaifenesin) .Marland Kitchen... As needed    Proair Hfa 108 (90 Base) Mcg/act Aers (Albuterol sulfate) ..... Inhale 2 puffs every four hours as needed    Symbicort 80-4.5 Mcg/act Aero (Budesonide-formoterol fumarate) ..... Inhale 2 puffs two times a day    Doxycycline Hyclate 100 Mg Caps (Doxycycline hyclate) ..... Once daily    Albuterol Sulfate (2.5 Mg/3ml) 0.083% Nebu (Albuterol sulfate) .Marland Kitchen..Marland Kitchen Two times a day as needed  Orders: Est. Patient Level III ([address])  Problem # 2:  C O P D (ICD-496) symbicort two times a day Add spiriva next visit  Medications Added to Medication List This Visit: 1)  Zetia  .... Take 1 tablet by mouth once a day 2)  Crestor 40 Mg Tabs (Rosuvastatin calcium) .... Take 1 tab by mouth at bedtime 3)  Alprazolam 0.5 Mg Tabs (Alprazolam) .... Take 1 tab by mouth at bedtime 4)  Doxycycline Hyclate 100 Mg Caps (Doxycycline hyclate) .... Once daily 5)  Albuterol Sulfate (2.5 Mg/23ml) 0.083% Nebu (Albuterol sulfate) .... Two times a day as needed  Patient Instructions: 1)  Copy sent to:Dr Cresenzo 2)  Please schedule a follow-up appointment in 1 month with TP 3)  Take antibiotic x 10 days Prescriptions: PROAIR HFA 108 (90 BASE) MCG/ACT AERS (ALBUTEROL SULFATE) Inhale 2 puffs every four hours as needed  #1 x 5   Entered and Authorized by:   Comer Locket Vassie Loll MD   Signed by:   Comer Locket Vassie Loll MD on 10/22/2009   Method used:   [facility]  [place] [address] [facility]       [address]       [place], [place]  [address]       Ph: [phone] or [phone]       Fax: [phone]   RxID:   [id number] ALBUTEROL SULFATE (2.5 MG/3ML) 0.083% NEBU (ALBUTEROL SULFATE) two times a day as needed  #60 x  1   Entered and Authorized by:   Comer Locket. Vassie Loll MD   Signed by:   Comer Locket Vassie Loll MD on 10/22/2009   Method used:   [facility]        [facility]  [place] [address] [facility]       [address]       [place], [place]  [address]       Ph: [phone] or [phone]       Fax: [phone]   RxID:   [id number] DOXYCYCLINE HYCLATE 100 MG CAPS (DOXYCYCLINE HYCLATE) once daily  #10 x 0   Entered and Authorized by:   Comer Locket Vassie Loll MD   Signed by:   Comer Locket Vassie Loll MD on 10/22/2009   Method used:  [facility]        [facility]  [place] [address]       [address]       [place], [place]  [address]       Ph: [phone] or [phone]       Fax: [phone]   RxID:   [id number]    Immunization History:  Influenza Immunization History:    Influenza:  historical (06/23/2009)  Pneumovax Immunization History:    Pneumovax:  historical (03/18/2008)

## 2010-11-17 NOTE — Progress Notes (Signed)
Summary: refill-cough med  Phone Note Call from Patient   Caller: Patient Call For: alva Reason for Call: Refill Medication Summary of Call: Patient needing a refill--cough medicine? did not know name of med.  [facility] [phone] Initial call taken by: Lehman Prom,  August 11, 2010 4:03 PM  Follow-up for Phone Call        Spoke with pt.  He states that he was here seeing Dr Vassie Loll on 07/27/10 and apparently was under the impression that his cheratussin syrup was going to be refilled but this was not done.  Pls advise if okay to refill, thanks Follow-up by: Vernie Murders,  August 11, 2010 4:12 PM  Additional Follow-up for Phone Call Additional follow up Details #1::        OK x 1 Additional Follow-up by: Comer Locket. Vassie Loll MD,  August 11, 2010 5:17 PM    Additional Follow-up for Phone Call Additional follow up Details #2::    Rx refill was called to pharm.  Spoke with pt's spouse Darel Hong and notified. Follow-up by: Vernie Murders,  August 11, 2010 5:20 PM  Prescriptions: CHERATUSSIN AC 100-10 MG/5ML SYRP (GUAIFENESIN-CODEINE) Take 1/2 to 1 teaspoon at bedtime  #181mL x 1   Entered by:   Vernie Murders   Authorized by:   Comer Locket. Vassie Loll MD   Signed by:   Vernie Murders on 08/11/2010   Method used:   Telephoned to ...       [facility]  [place] [address] (retail)       [address]       [place], [place]  [address]       Ph: [phone] or [phone]       Fax: [phone]   RxID:   [id number]

## 2010-11-17 NOTE — Progress Notes (Signed)
Summary: Does pt need to come in first?  Phone Note Call from Patient   Reason for Call: Talk to Nurse Summary of Call: Pt's wife called. She said that they havent heard anything from [place] about setting up her husband's yearly EGD and wanted to know does she need to make [name] an appt to see RMR or can they just set up the EGD since they do it every year.  I told her I would ask to see how RMR wanted this done and we would call her back. [phone] Initial call taken by: Diana Eves,  January 05, 2010 12:34 PM     Appended Document: Does pt need to come in first? needs EGD next month; updated triage only should suffice.  Appended Document: Does pt need to come in first? Spoke with wife. She gave info. Pt is scheduled for 01/21/10 @ 8:15 AM, LMOM for Kim.

## 2010-11-17 NOTE — Assessment & Plan Note (Signed)
Summary: NP follow up - COPD   Copy to:  self Primary Provider/Referring Provider:  cresenzo  CC:  4 month follow up - states breathing is doing well.  no new complaints.  History of Present Illness: 73/M, ex- smokerfor FU of  severe  COPD & cough. 5/10  PFTs >> severe obstruction, FEV1 39%, no BD response  He developed a  chronic cough.since his heart surgery by [name] in 6/09. Cough drops & cough syrups only give temporary relief. Albuterol nebs & MDI are not very helpful. Barrett's esophagus 3 cm noted to be stable on recent EGD by [name]. He is seeing a nephrologist for high potassium, no ACE (-) on med list.  H/o sinus surgery, tonsilectomy CXR 8/09 bibasal atelectasis with pleural calcification rt diaphragm, severe COPD. CT chest 4/09 severe emphysema, scattered sub-cm nodules stable since 2005  January 20, 2010 1:45 PM  On symbicort & spiriva now. Coughing bout in th eoffice, green phlegm >> avelox  Mar 11, 2010 4:13 PM  EGD uneventful. Desaturates on exertion & during sleep - started on oxygen. Using wife's albuterol. Spiriva caused sores in his mouth - now off. BP low on diuretics & metoprolol   May 25, 2010--Presents for 4 month follow up - states breathing is doing well. He  continues to wear O2 at bedtime and uses Symbicort two times a day.  No change in activity level. Appetite is good, Denies chest pain,  orthopnea, hemoptysis, fever, n/v/d, edema.    Medications Prior to Update: 1)  Mucinex Maximum Strength 1200 Mg Xr12h-Tab (Guaifenesin) .... As Needed 2)  Furosemide 20 Mg Tabs (Furosemide) .... Once Daily 3)  Metoprolol Tartrate 25 Mg Tabs (Metoprolol Tartrate) .... 1/2 Two Times A Day 4)  Omeprazole 20 Mg Cpdr (Omeprazole) .... One By Mouth 30 Mins Before Breakfast Daily 5)  Bayer Aspirin 325 Mg Tabs (Aspirin) .Marland Kitchen.. 1 Once Daily 6)  Proair Hfa 108 (90 Base) Mcg/act Aers (Albuterol Sulfate) .... Inhale 2 Puffs Every Four Hours As Needed 7)  Symbicort 80-4.5  Mcg/act Aero (Budesonide-Formoterol Fumarate) .... Inhale 2 Puffs Two Times A Day 8)  Crestor 40 Mg Tabs (Rosuvastatin Calcium) .... Take 1 Tab By Mouth At Bedtime 9)  Amitriptyline Hcl 100 Mg Tabs (Amitriptyline Hcl) .... Take 1 Tablet By Mouth Once A Day 10)  Cheratussin Ac 100-10 Mg/69ml Syrp (Guaifenesin-Codeine) .... Take 1/2 To 1 Teaspoon At Bedtime  Current Medications (verified): 1)  Mucinex Maximum Strength 1200 Mg Xr12h-Tab (Guaifenesin) .... As Needed 2)  Omeprazole 20 Mg Cpdr (Omeprazole) .... One By Mouth 30 Mins Before Breakfast Daily 3)  Bayer Aspirin 325 Mg Tabs (Aspirin) .Marland Kitchen.. 1 Once Daily 4)  Proair Hfa 108 (90 Base) Mcg/act Aers (Albuterol Sulfate) .... Inhale 2 Puffs Every Four Hours As Needed 5)  Symbicort 80-4.5 Mcg/act Aero (Budesonide-Formoterol Fumarate) .... Inhale 2 Puffs Two Times A Day 6)  Crestor 40 Mg Tabs (Rosuvastatin Calcium) .... Take 1 Tab By Mouth At Bedtime 7)  Amitriptyline Hcl 100 Mg Tabs (Amitriptyline Hcl) .... Take 1 Tablet By Mouth Once A Day 8)  Cheratussin Ac 100-10 Mg/62ml Syrp (Guaifenesin-Codeine) .... Take 1/2 To 1 Teaspoon At Bedtime 9)  Oxygen .... Wear 2l/min At Bedtime 10)  Hydrochlorothiazide 25 Mg Tabs (Hydrochlorothiazide) .... 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 (quit in 1994 pipe/cigars)  Risk Factors: Alcohol Use: 0 (02/14/2009)  Risk Factors: Smoking Status: quit (03/11/2010) Packs/Day: 1 pk (02/14/2009)  Review of  Systems      See HPI  Vital Signs:  Patient profile:   75 year old male Height:      71 inches Weight:      145.13 pounds BMI:     20.31 O2 Sat:      96 % on Room air Temp:     97.1 degrees F oral Pulse rate:   82 / minute BP sitting:   140 / 90  (left arm) Cuff size:   regular  Vitals Entered By: Boone Master CNA/MA (May 25, 2010 2:35 PM)  O2 Flow:  Room air CC: 4 month follow up - states breathing is doing well.  no new complaints Is Patient Diabetic? No Comments Medications reviewed with patient Daytime contact number verified with patient. Boone Master CNA/MA  May 25, 2010 2:35 PM    Physical Exam  Additional Exam:  Gen. Pleasant, well-nourished, in no distress, normal affect ENT - no lesions, no post nasal drip Neck: No JVD, no thyromegaly, no carotid bruits Lungs: no use of accessory muscles, no dullness to percussion, decreased without rales or rhonchi  Cardiovascular: Rhythm regular, heart sounds  normal, no murmurs or gallops, no peripheral edema Musculoskeletal: No deformities, no cyanosis or clubbing      Impression & Recommendations:  Problem # 1:  C O P D (ICD-496) compensated on present regimen.  follow up 2-3 months   Medications Added to Medication List This Visit: 1)  Oxygen  .... Wear 2l/min at bedtime 2)  Hydrochlorothiazide 25 Mg Tabs (Hydrochlorothiazide) .... Take 1 tablet by mouth once a day  Complete Medication List: 1)  Mucinex Maximum Strength 1200 Mg Xr12h-tab (Guaifenesin) .... As needed 2)  Omeprazole 20 Mg Cpdr (Omeprazole) .... One by mouth 30 mins before breakfast daily 3)  Bayer Aspirin 325 Mg Tabs (Aspirin) .Marland Kitchen.. 1 once daily 4)  Proair Hfa 108 (90 Base) Mcg/act Aers (Albuterol sulfate) .... Inhale 2 puffs every four hours as needed 5)  Symbicort 80-4.5 Mcg/act Aero (Budesonide-formoterol fumarate) .... Inhale 2 puffs two times a day 6)  Crestor 40 Mg Tabs (Rosuvastatin calcium) .... Take 1 tab by mouth at bedtime 7)   Amitriptyline Hcl 100 Mg Tabs (Amitriptyline hcl) .... Take 1 tablet by mouth once a day 8)  Cheratussin Ac 100-10 Mg/85ml Syrp (Guaifenesin-codeine) .... Take 1/2 to 1 teaspoon at bedtime 9)  Oxygen  .... Wear 2l/min at bedtime 10)  Hydrochlorothiazide 25 Mg Tabs (Hydrochlorothiazide) .... Take 1 tablet by mouth once a day  Other Orders: Est. Patient Level II ([address])  Patient Instructions: 1)  Continue on same meds. 2)  follow up Dr. Vassie Loll in 2-3 months  3)  Please contact office for sooner follow up as needed

## 2010-11-17 NOTE — Progress Notes (Signed)
Summary: 02  Phone Note Call from Patient   Caller: [email or web address]  Call For: alva Summary of Call: when at pts home as soon as pt got up out of his chair his ra sats dropped to 85%/ 92% when sitting and haven't even done ono yet [phone] Initial call taken by: Oneita Jolly,  Feb 20, 2010 12:31 PM  Follow-up for Phone Call        this is dr Reginia Naas pt.  please route to him. Follow-up by: Barbaraann Share MD,  Feb 20, 2010 4:38 PM  Additional Follow-up for Phone Call Additional follow up Details #1::        pl arrange OV with TP or me for O2 evaluation Additional Follow-up by: Comer Locket. Vassie Loll MD,  Feb 23, 2010 1:18 PM    Additional Follow-up for Phone Call Additional follow up Details #2::    spoke to wife [facility] didi do ono and they are waiting to hear from the results Follow-up by: Oneita Jolly,  Feb 24, 2010 2:52 PM

## 2010-11-17 NOTE — Progress Notes (Signed)
Summary: spiriva rx  Phone Note Call from Patient Call back at Home Phone [phone]   Caller: Patient Call For: [name] Summary of Call: pt's spouse came in office. wants [facility] [place] [phone]. says this was to be faxed in per conversation pt had w/ tp at last ov on 11/21/09  Initial call taken by: Tivis Ringer, CNA,  December 01, 2009 2:04 PM  Follow-up for Phone Call        Rx for spiriva sent to pharm.  LMOM for pt to be made aware. Follow-up by: Vernie Murders,  December 01, 2009 2:51 PM    New/Updated Medications: SPIRIVA HANDIHALER 18 MCG CAPS (TIOTROPIUM BROMIDE MONOHYDRATE) inhale contents of 1 capsule daily Prescriptions: SPIRIVA HANDIHALER 18 MCG CAPS (TIOTROPIUM BROMIDE MONOHYDRATE) inhale contents of 1 capsule daily  #30 x 3   Entered by:   Vernie Murders   Authorized by:   Rubye Oaks NP   Signed by:   Vernie Murders on 12/01/2009   Method used:   [facility]        [facility]  [place] [address]       [address]       [place], [place]  [address]       Ph: [phone] or [phone]       Fax: [phone]   RxID:   [id number]

## 2010-11-17 NOTE — Progress Notes (Signed)
Summary: Cheratussin refill request  Phone Note Refill Request Message from:  Fax from Pharmacy  Cheratussin [facility] Syrup Take 1/2 to 1 teaspoonful at bedtime  [facility] [place] [address] phone [phone] fax [phone] last filled 01-20-2010 Please advise  Initial call taken by: Zackery Barefoot CMA,  Feb 19, 2010 10:29 AM  Follow-up for Phone Call        ok x 1 refill Follow-up by: Comer Locket. Vassie Loll MD,  Feb 19, 2010 12:25 PM  Additional Follow-up for Phone Call Additional follow up Details #1::        Rx called to pharmacy Additional Follow-up by: Zackery Barefoot CMA,  Feb 19, 2010 2:47 PM    New/Updated Medications: CHERATUSSIN AC 100-10 MG/5ML SYRP (GUAIFENESIN-CODEINE) Take 1/2 to 1 teaspoon at bedtime Prescriptions: CHERATUSSIN AC 100-10 MG/5ML SYRP (GUAIFENESIN-CODEINE) Take 1/2 to 1 teaspoon at bedtime  #160mL x 1   Entered by:   Zackery Barefoot CMA   Authorized by:   Comer Locket. Vassie Loll MD   Signed by:   Zackery Barefoot CMA on 02/19/2010   Method used:   Telephoned to ...       [facility]  [place] [address] ([facility]ail)       [address]       [place], [place]  [address]       Ph: [phone] or [phone]       Fax: [phone]   RxID:   [id number]

## 2010-11-17 NOTE — Assessment & Plan Note (Signed)
Summary: 1 month/ mbw   Copy to:  self Primary Provider/Referring Provider:  cresenzo  CC:  1 month follow up - states feels better.  History of Present Illness: 75/M, ex- smokerfor FU of  severe  COPD. He developed a  chronic cough.since his heart surgery by [name] in 6/09. Cough drops & cough syrups only give temporary relief. Albuterol nebs & MDI are not very helpful. Barrett's esophagus 3 cm noted to be stable on recent EGD by [name]. He is seeing a nephrologist for high potassium, no ACE (-) on med list.  H/o sinus surgery, tonsilectomy CXR 8/09 bibasal atelectasis with pleural calcification rt diaphragm, severe COPD. CT chest 4/09 severe emphysema, scattered sub-cm nodules stable since 2005  5/10 >> cough almost gone! dyspnea persists PFTs >> severe obstruction, FEV1 39%, no BD response  July 11, 2009--Returns for 4 month follow up - states breathing has "has  good days and bad days"-states is using the symbicort every morning.   He is able to mow grass-rides lawnmower and still  weedeats- does rest for few minutes .   October 22, 2009 2:30 PM  flu like symptoms around [place] time. CXR was wnl, treated with avelox. Still c/o yellow phlegm, no wheezing.   November 21, 2009--Presents for a 1 month follow up - states feels better  . Last visit  w/ flare tx w/ abx. Currently on Symbicort two times a day. His FEV1 is 39%. Denies chest pain,  orthopnea, hemoptysis, fever, n/v/d, edema, headache. still wears out easily.   Medications Prior to Update: 1)  Mucinex Maximum Strength 1200 Mg Xr12h-Tab (Guaifenesin) .... As Needed 2)  Furosemide 20 Mg Tabs (Furosemide) .... Once Daily 3)  Metoprolol Tartrate 25 Mg Tabs (Metoprolol Tartrate) .... Two Times A Day 4)  Omeprazole 20 Mg Cpdr (Omeprazole) .... One By Mouth 30 Mins Before Breakfast Daily 5)  Simvastatin 80 Mg Tabs (Simvastatin) .... At Bedtime 6)  Bayer Aspirin 325 Mg Tabs (Aspirin) .Marland Kitchen.. 1 Once Daily 7)  Proair Hfa  108 (90 Base) Mcg/act Aers (Albuterol Sulfate) .... Inhale 2 Puffs Every Four Hours As Needed 8)  Symbicort 80-4.5 Mcg/act Aero (Budesonide-Formoterol Fumarate) .... Inhale 2 Puffs Two Times A Day 9)  Zetia .... Take 1 Tablet By Mouth Once A Day 10)  Crestor 40 Mg Tabs (Rosuvastatin Calcium) .... Take 1 Tab By Mouth At Bedtime 11)  Alprazolam 0.5 Mg Tabs (Alprazolam) .... Take 1 Tab By Mouth At Bedtime  Current Medications (verified): 1)  Mucinex Maximum Strength 1200 Mg Xr12h-Tab (Guaifenesin) .... As Needed 2)  Furosemide 20 Mg Tabs (Furosemide) .... Once Daily 3)  Metoprolol Tartrate 25 Mg Tabs (Metoprolol Tartrate) .... Two Times A Day 4)  Omeprazole 20 Mg Cpdr (Omeprazole) .... One By Mouth 30 Mins Before Breakfast Daily 5)  Bayer Aspirin 325 Mg Tabs (Aspirin) .Marland Kitchen.. 1 Once Daily 6)  Proair Hfa 108 (90 Base) Mcg/act Aers (Albuterol Sulfate) .... Inhale 2 Puffs Every Four Hours As Needed 7)  Symbicort 80-4.5 Mcg/act Aero (Budesonide-Formoterol Fumarate) .... Inhale 2 Puffs Two Times A Day 8)  Zetia 10 Mg Tabs (Ezetimibe) .... Take 1 Tablet By Mouth Once A Day 9)  Crestor 40 Mg Tabs (Rosuvastatin Calcium) .... Take 1 Tab By Mouth At Bedtime 10)  Amitriptyline Hcl 100 Mg Tabs (Amitriptyline Hcl) .... 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 No Comments Medications reviewed with patient Daytime contact number verified with patient. Boone Master CNA  November 21, 2009 3:02 PM    Physical Exam  Additional Exam:  Gen. Pleasant, well-nourished, in no distress, normal affect ENT - no lesions, no post nasal drip Neck: No JVD, no thyromegaly, no carotid bruits Lungs: no use of accessory muscles, no dullness to percussion, decreased without rales or rhonchi  Cardiovascular: Rhythm regular, heart sounds  normal, no murmurs or gallops, no peripheral edema Musculoskeletal: No deformities, no cyanosis or clubbing      Impression & Recommendations:  Problem # 1:  C O P D (ICD-496) Continue on Symbicort 2 puffs two times a day  Add Spiriva  1 puff once daily  follow up 2 months Dr. Vassie Loll  Please contact office for sooner follow up if symptoms do not improve or worsen   Medications Added to Medication List This Visit: 1)  Zetia 10 Mg Tabs (Ezetimibe) .... Take 1 tablet by mouth once a day 2)  Amitriptyline Hcl 100 Mg Tabs (Amitriptyline hcl) .... Take 1 tablet by mouth once a day  Complete Medication List: 1)  Mucinex Maximum Strength 1200 Mg Xr12h-tab (Guaifenesin) .... As needed 2)  Furosemide 20 Mg Tabs (Furosemide) .... Once daily 3)  Metoprolol Tartrate 25 Mg Tabs (Metoprolol tartrate) .... Two times a day 4)  Omeprazole 20 Mg Cpdr (Omeprazole) .... One by mouth 30 mins before breakfast daily 5)  Bayer Aspirin 325 Mg Tabs (Aspirin) .Marland Kitchen.. 1 once daily 6)  Proair Hfa 108 (90 Base) Mcg/act Aers (Albuterol sulfate) ....  Inhale 2 puffs every four hours as needed 7)  Symbicort 80-4.5 Mcg/act Aero (Budesonide-formoterol fumarate) .... Inhale 2 puffs two times a day 8)  Zetia 10 Mg Tabs (Ezetimibe) .... Take 1 tablet by mouth once a day 9)  Crestor 40 Mg Tabs (Rosuvastatin calcium) .... Take 1 tab by mouth at bedtime 10)  Amitriptyline Hcl 100 Mg Tabs (Amitriptyline hcl) .... Take 1 tablet by mouth once a day  Other Orders: Est. Patient Level IV (21308)  Patient Instructions: 1)  Continue on Symbicort 2 puffs two times a day  2)  Add Spiriva  1 puff once daily  3)  follow up 2 months Dr. Vassie Loll  4)  Please contact office for sooner follow up if symptoms do not improve or worsen

## 2010-11-17 NOTE — Letter (Signed)
Summary: Statement of Medical Necessity/ [facility]  Statement of Medical Necessity/ [facility]   Imported By: Lennie Odor 08/17/2010 09:03:33  _____________________________________________________________________  External Attachment:    Type:   Image     Comment:   External Document

## 2010-11-17 NOTE — Assessment & Plan Note (Signed)
Summary: evaluate o2 levels/mg   Visit Type:  Follow-up Copy to:  self Primary Provider/Referring Provider:  cresenzo  CC:  Pt c/o increased SOB with activity and sometimes at rest. Pt c/o low BP readings.  History of Present Illness: 75/M, ex- smokerfor FU of  severe  COPD & cough. 5/10  PFTs >> severe obstruction, FEV1 39%, no BD response  He developed a  chronic cough.since his heart surgery by [name] in 6/09. Cough drops & cough syrups only give temporary relief. Albuterol nebs & MDI are not very helpful. Barrett's esophagus 3 cm noted to be stable on recent EGD by [name]. He is seeing a nephrologist for high potassium, no ACE (-) on med list.  H/o sinus surgery, tonsilectomy CXR 8/09 bibasal atelectasis with pleural calcification rt diaphragm, severe COPD. CT chest 4/09 severe emphysema, scattered sub-cm nodules stable since 2005  75  5, 2011 1:45 PM  On symbicort & spiriva now. Coughing bout in th eoffice, green phlegm >> avelox  Mar 11, 2010 4:13 PM  EGD uneventful. Desaturates on exertion & during sleep - started on oxygen. Using wife's albuterol. Spiriva caused sores in his mouth - now off. BP low on diuretics & metoprolol  Denies chest pain,  orthopnea, hemoptysis, fever, n/v/d, edema, headache. still wears out easily.   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 (quit in 1994 pipe/cigars)  Review of Systems     The patient complains of dyspnea on exertion.  The patient denies anorexia, fever, weight loss, weight gain, vision loss, decreased hearing, hoarseness, chest pain, syncope, peripheral edema, prolonged cough, headaches, hemoptysis, abdominal pain, melena, hematochezia, severe indigestion/heartburn, hematuria, muscle weakness, suspicious skin lesions, difficulty walking, depression, unusual weight change, and abnormal bleeding.    Vital Signs:  Patient profile:   75 year old male Height:      71 inches Weight:      148 pounds O2 Sat:      91 % on Room air Temp:     98.5 degrees F oral Pulse rate:   83 / minute BP sitting:   98 / 60  (left arm) Cuff size:   regular  Vitals Entered By: Zackery Barefoot CMA (Mar 11, 2010 3:53 PM)  O2 Flow:  Room air CC: Pt c/o increased SOB with activity and sometimes at rest. Pt c/o low BP readings Comments Medications reviewed with patient Verified contact number and pharmacy with patient Zackery Barefoot CMA  Mar 11, 2010 3:58 PM    Physical Exam  Additional Exam:  Gen. Pleasant, well-nourished, in no distress, normal affect ENT - no lesions, no post nasal drip Neck: No JVD, no thyromegaly, no carotid bruits Lungs: no use of accessory muscles, no dullness to percussion, decreased without rales or rhonchi  Cardiovascular: Rhythm regular, heart sounds  normal, no murmurs or gallops, no peripheral edema Musculoskeletal: No deformities, no cyanosis or clubbing      Impression & Recommendations:  Problem # 1:  C O P D (ICD-496) ct symbicort OK to use albuterol nebs as  needed  - add atrovent in future ct O2 during sleep & on exertion  Problem # 2:  HYPERTENSION (ICD-401.9)  hold parameters on metoprolol - defer to his cardiologist & nephrologist His updated medication list for this problem includes:    Furosemide 20 Mg Tabs (Furosemide) ..... Once daily    Metoprolol Tartrate 25 Mg Tabs (Metoprolol tartrate) .Marland Kitchen... 1/2 two times a  day  Orders: Est. Patient Level IV (35361)  Medications Added to Medication List This Visit: 1)  Metoprolol Tartrate 25 Mg Tabs (Metoprolol tartrate) .... 1/2 two times a day  Patient Instructions: 1)  Copy sent to: Dr Nobie Putnam, [facility] 2)  Please schedule a follow-up appointment in 4 months. 3)  Use nebuliser medications as needed  4)  OK to stay off spiriva 5)  stay on the oxygen during sleep 6)  DO not take metoprolol if your bP lower than 90     Appended Document: evaluate o2 levels/mg OV in 2 mnths with TP  Appended Document: evaluate o2 levels/mg Pt is scheduled August 8, with TP.  Appended Document: evaluate o2 levels/mg 85% on RA - see phone note on 02/20/10 - should qualify

## 2010-11-17 NOTE — Procedures (Signed)
Summary: Oximetry & Spirometry/[facility]xygen Qualifying Services  Oximetry & Spirometry/Oxygen Qualifying Services   Imported By: Sherian Rein 08/31/2010 08:40:48  _____________________________________________________________________  External Attachment:    Type:   Image     Comment:   External Document

## 2010-11-17 NOTE — Assessment & Plan Note (Signed)
Summary: 2 months/apc   Copy to:  self Primary Provider/Referring Provider:  cresenzo  CC:  2 month followup.  Pt states that his breathing is the same- no better or worse.  He denies any new complaints today.Marland Kitchen  History of Present Illness: 73/M, ex- smokerfor FU of  severe  COPD & cough. 5/10  PFTs >> severe obstruction, FEV1 39%, no BD response  He developed a  chronic cough.since his heart surgery by [name] in 6/09. Cough drops & cough syrups only give temporary relief. Albuterol nebs & MDI are not very helpful. Barrett's esophagus 3 cm noted to be stable on recent EGD by [name]. He is seeing a nephrologist for high potassium, no ACE (-) on med list.  H/o sinus surgery, tonsilectomy CXR 8/09 bibasal atelectasis with pleural calcification rt diaphragm, severe COPD. CT chest 4/09 severe emphysema, scattered sub-cm nodules stable since 2005  January 20, 2010 1:45 PM  On symbicort & spiriva now. Coughing bout in th eoffice, green phlegm. EGD planned tomorrow  Denies chest pain,  orthopnea, hemoptysis, fever, n/v/d, edema, headache. still wears out easily.   Current Medications (verified): 1)  Mucinex Maximum Strength 1200 Mg Xr12h-Tab (Guaifenesin) .... As Needed 2)  Furosemide 20 Mg Tabs (Furosemide) .... Once Daily 3)  Metoprolol Tartrate 25 Mg Tabs (Metoprolol Tartrate) .... Two Times A Day 4)  Omeprazole 20 Mg Cpdr (Omeprazole) .... One By Mouth 30 Mins Before Breakfast Daily 5)  Bayer Aspirin 325 Mg Tabs (Aspirin) .Marland Kitchen.. 1 Once Daily 6)  Proair Hfa 108 (90 Base) Mcg/act Aers (Albuterol Sulfate) .... Inhale 2 Puffs Every Four Hours As Needed 7)  Symbicort 80-4.5 Mcg/act Aero (Budesonide-Formoterol Fumarate) .... Inhale 2 Puffs Two Times A Day 8)  Crestor 40 Mg Tabs (Rosuvastatin Calcium) .... Take 1 Tab By Mouth At Bedtime 9)  Amitriptyline Hcl 100 Mg Tabs (Amitriptyline Hcl) .... Take 1 Tablet By Mouth Once A Day 10)  Spiriva Handihaler 18 Mcg Caps (Tiotropium Bromide Monohydrate)  .... 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  The patient denies anorexia, fever, weight loss, weight gain, vision loss, decreased hearing, hoarseness, chest pain, syncope, peripheral edema, headaches, hemoptysis, abdominal pain, melena, hematochezia, severe indigestion/heartburn, hematuria, muscle weakness, suspicious skin lesions, transient blindness, difficulty walking, depression, unusual weight change, and abnormal bleeding.    Vital Signs:  Patient profile:   75 year old male Weight:      151.25 pounds O2 Sat:      95 % on Room air Temp:     97.3 degrees F oral Pulse rate:   80 / minute BP sitting:   118 / 60  (left arm)  Vitals Entered By: Vernie Murders (January 20, 2010 1:40 PM)  O2 Flow:  Room air  Physical Exam  Additional Exam:  Gen. Pleasant, well-nourished, in no distress, normal affect ENT - no lesions, no post nasal drip Neck: No JVD, no thyromegaly, no carotid bruits Lungs: no use of accessory muscles, no dullness to percussion, decreased without rales or rhonchi  Cardiovascular: Rhythm regular, heart sounds  normal, no murmurs or  gallops, no peripheral edema Musculoskeletal: No deformities, no cyanosis or clubbing      Impression & Recommendations:  Problem # 1:  BRONCHITIS, ACUTE (ICD-466.0) Will treat aggressively since EGD planned tomorrow. His updated medication list for this problem includes:    Mucinex Maximum Strength 1200 Mg Xr12h-tab (Guaifenesin) .Marland Kitchen... As needed    Proair Hfa 108  (90 Base) Mcg/act Aers (Albuterol sulfate) ..... Inhale 2 puffs every four hours as needed    Symbicort 80-4.5 Mcg/act Aero (Budesonide-formoterol fumarate) ..... Inhale 2 puffs two times a day    Spiriva Handihaler 18 Mcg Caps (Tiotropium bromide monohydrate) ..... Inhale contents of 1 capsule daily    Zyrtec-d Allergy & Congestion 5-120 Mg Xr12h-tab (Cetirizine-pseudoephedrine) ..... Once daily    Avelox 400 Mg Tabs (Moxifloxacin hcl) ..... Once daily    Cheratussin Ac 100-10 Mg/56ml Syrp (Guaifenesin-codeine) .Marland Kitchen... 2.5 - 5ml by mouth at bedtime  Orders: Est. Patient Level III (01093) Prescription Created Electronically [phone])  Problem # 2:  C O P D (ICD-496) ct symbicort & spiriva  Medications Added to Medication List This Visit: 1)  Zyrtec-d Allergy & Congestion 5-120 Mg Xr12h-tab (Cetirizine-pseudoephedrine) .... Once daily 2)  Avelox 400 Mg Tabs (Moxifloxacin hcl) .... Once daily 3)  Cheratussin Ac 100-10 Mg/16ml Syrp (Guaifenesin-codeine) .... 2.5 - 5ml by mouth at bedtime  Patient Instructions: 1)  Copy sent to: Dr Nobie Putnam 2)  Please schedule a follow-up appointment in 4 months with TP 3)  Antibiotic x 7 days 4)  Zyrtec -D daily  for next 2 months 5)  Stay on symbicort & spiriva 6)  Cheratussin cough syrup once daily  Prescriptions: CHERATUSSIN AC 100-10 MG/5ML SYRP (GUAIFENESIN-CODEINE) 2.5 - 5ml by mouth at bedtime  #100 x 0   Entered and Authorized by:   Comer Locket Vassie Loll MD   Signed by:   Comer Locket Vassie Loll MD on 01/20/2010   Method used:   Print then Give to Patient   RxID:   [id number] AVELOX 400 MG TABS (MOXIFLOXACIN HCL) once daily  #7 x 0   Entered and Authorized by:   Comer Locket. Vassie Loll MD   Signed by:   Comer Locket Vassie Loll MD on 01/20/2010   Method used:   Electronically to        [facility]  [place] [address] (retail)       [address]       [place], [place]  [address]       Ph: [phone] or [phone]       Fax: [phone]   RxID:   [id number] ZYRTEC-D ALLERGY  & CONGESTION 5-120 MG XR12H-TAB (CETIRIZINE-PSEUDOEPHEDRINE) once daily  #30 x 1   Entered and Authorized by:   Comer Locket. Vassie Loll MD   Signed by:   Comer Locket Vassie Loll MD on 01/20/2010   Method used:   Electronically to        [facility]  [place] [address] (retail)       [address]       [place], [place]  [address]       Ph: [phone] or [phone]       Fax: [phone]   RxID:   [id number]

## 2010-11-17 NOTE — Procedures (Signed)
Summary: Oximetry/[facility]  Oximetry/[facility]   Imported By: Sherian Rein 03/02/2010 08:00:06  _____________________________________________________________________  External Attachment:    Type:   Image     Comment:   External Document

## 2010-11-17 NOTE — Letter (Signed)
Summary: Internal Other [name]  Internal Other [name]   Imported By: Cloria Spring LPN [id number] 28:41:32  _____________________________________________________________________  External Attachment:    Type:   Image     Comment:   External Document

## 2010-11-17 NOTE — Assessment & Plan Note (Signed)
Summary: needs all 3 sats for 02 qualifiing   Visit Type:  Follow-up Copy to:  self Primary Provider/Referring Provider:  cresenzo  CC:  Pt here for follow up and recert for night time O2 use. Pt request refill on Albuterol neb.  History of Present Illness: 73/M, ex- smokerfor FU of  severe  COPD & cough. 5/10  PFTs >> severe obstruction, FEV1 39%, no BD response  He developed a  chronic cough.since his heart surgery by [name] in 6/09. Cough drops & cough syrups only give temporary relief. Albuterol nebs & MDI are not very helpful. Barrett's esophagus 3 cm noted to be stable on recent EGD by [name]. He is seeing a nephrologist for high potassium, no ACE (-) on med list.  H/o sinus surgery, tonsilectomy CXR 8/09 bibasal atelectasis with pleural calcification rt diaphragm, severe COPD. CT chest 4/09 severe emphysema, scattered sub-cm nodules stable since 2005  January 20, 2010 1:45 PM  On symbicort & spiriva now. Coughing bout in th eoffice, green phlegm >> avelox  Mar 11, 2010 4:13 PM  EGD uneventful. Desaturates on exertion & during sleep - started on oxygen. Using wife's albuterol. Spiriva caused sores in his mouth - now off. BP low on diuretics & metoprolol   May 25, 2010--Presents for 4 month follow up - states breathing is doing well. He  continues to wear O2 at bedtime and uses Symbicort two times a day.  No change in activity level. Appetite is good,   July 27, 2010 2:46 PM  Using O2 at night only, DMe requesting O2 recertification Denies chest pain,  orthopnea, hemoptysis, fever, n/v/d, edema.   .Ambulatory Pulse Oximetry  Resting; HR__83___    02 Sat93_____  Lap1 (185 feet)   HR__89___   02 Sat__93___ Lap2 (185 feet)   HR__95___   02 Sat___87__    Lap3 (185 feet)   HR_____   02 Sat_____  ___Test Completed without Difficulty _x__Test Stopped due ZO:[id number], pt recovered after rest to HR 70 Sats 93 .Kandice Hams [facility]  July 27, 2010 3:17  PM    Preventive Screening-Counseling & Management  Alcohol-Tobacco     Alcohol drinks/day: 0     Smoking Status: quit     Packs/Day: 1.0     Year Started: 42 yrs     Year Quit: 1994  Current Medications (verified): 1)  Mucinex Maximum Strength 1200 Mg Xr12h-Tab (Guaifenesin) .... As Needed 2)  Omeprazole 20 Mg Cpdr (Omeprazole) .... One By Mouth 30 Mins Before Breakfast Daily 3)  Bayer Aspirin 325 Mg Tabs (Aspirin) .Marland Kitchen.. 1 Once Daily 4)  Proair Hfa 108 (90 Base) Mcg/act Aers (Albuterol Sulfate) .... Inhale 2 Puffs Every Four Hours As Needed 5)  Symbicort 80-4.5 Mcg/act Aero (Budesonide-Formoterol Fumarate) .... Inhale 2 Puffs Two Times A Day 6)  Crestor 40 Mg Tabs (Rosuvastatin Calcium) .... Take 1 Tab By Mouth At Bedtime 7)  Amitriptyline Hcl 100 Mg Tabs (Amitriptyline Hcl) .... Take 1 Tablet By Mouth Once A Day 8)  Cheratussin Ac 100-10 Mg/29ml Syrp (Guaifenesin-Codeine) .... Take 1/2 To 1 Teaspoon At Bedtime 9)  Oxygen .... Wear 2l/min At Bedtime 10)  Hydrochlorothiazide 25 Mg Tabs (Hydrochlorothiazide) .... Take 1 Tablet By Mouth Once A Day 11)  Sps 15 Gm/97ml Susp (Sodium Polystyrene Sulfonate) .Marland Kitchen.. Twice Weekly 12)  Albuterol Sulfate (2.5 Mg/52ml) 0.083% Nebu (Albuterol Sulfate) .... 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 (quit in 1994 pipe/cigars)  Social History: Packs/Day:  1.0  Review of Systems       The patient complains of dyspnea on exertion.  The patient denies anorexia, fever, weight loss, weight gain, vision loss, decreased hearing, hoarseness, chest pain, syncope, peripheral edema, prolonged cough, headaches, hemoptysis, abdominal pain, melena,  hematochezia, severe indigestion/heartburn, hematuria, muscle weakness, suspicious skin lesions, difficulty walking, depression, abnormal bleeding, enlarged lymph nodes, and angioedema.    Vital Signs:  Patient profile:   75 year old male Height:      71 inches Weight:      146.6 pounds BMI:     20.52 O2 Sat:      94 % on Room air Temp:     97.6 degrees F oral Pulse rate:   85 / minute BP sitting:   130 / 70  (left arm) Cuff size:   regular  Vitals Entered By: Zackery Barefoot CMA (July 27, 2010 2:33 PM)  O2 Flow:  Room air CC: Pt here for follow up and recert for night time O2 use. Pt request refill on Albuterol neb Comments Medications reviewed with patient Verified contact number and pharmacy with patient Zackery Barefoot CMA  July 27, 2010 2:34 PM    Physical Exam  Additional Exam:  wt 146 Gen. Pleasant, well-nourished, in no distress, normal affect ENT - no lesions, no post nasal drip Neck: No JVD, no thyromegaly, no carotid bruits Lungs: no use of accessory muscles, no dullness to percussion, decreased without rales or rhonchi  Cardiovascular: Rhythm regular, heart sounds  normal, no murmurs or gallops, no peripheral edema Musculoskeletal: No deformities, no cyanosis or clubbing      Impression & Recommendations:  Problem # 1:  C O P D (ICD-496)  compensated on present regimen.  Desaturation to 87% on exertion qualifies him for portable O2 Flu shot  Medications Added to Medication List This Visit: 1)  Sps 15 Gm/46ml Susp (Sodium polystyrene sulfonate) .Marland Kitchen.. twice weekly 2)  Albuterol Sulfate (2.5 Mg/75ml) 0.083% Nebu (Albuterol sulfate) .... As needed  Other Orders: Est. Patient Level III (09811)  Patient Instructions: 1)  Copy sent to: 2)  Please schedule a follow-up appointment in 6 months with TP. 3)  Ambulatory satn   Immunization History:  Influenza Immunization History:    Influenza:  historical (07/06/2010)

## 2010-11-17 NOTE — Progress Notes (Signed)
Summary: ONO results- ATC Line busy  Phone Note Call from Patient Call back at Home Phone [phone]   Caller: Patient Call For: [name] Summary of Call: pt's spouse judy requests results of O2 test.  Initial call taken by: Tivis Ringer, CNA,  Feb 24, 2010 11:49 AM  Follow-up for Phone Call        pt's spouse is calling for noct oximetry results which were ordered on 02-18-2010.  will forward message to RA to address.  Aundra Millet Reynolds LPN  Feb 24, 2010 12:00 PM   let her know - low O2 level - will start O2 to use during sleep Follow-up by: Comer Locket. Vassie Loll MD,  Feb 24, 2010 4:07 PM  Additional Follow-up for Phone Call Additional follow up Details #1::        ATC x 2- Line busy, will call back Vernie Murders  Feb 24, 2010 4:12 PM     Additional Follow-up for Phone Call Additional follow up Details #2::    called and spoke with pt's wife. wife aware of ONO results and that RA ordered pt to start on oxygen at bedtime.  Wife also wanted to schedule pt an appt with RA to evaluate his o2 levels during the day.  wife states pt has occ sob during the day and wonders if he has low o2 sats.  scheduled pt to be seen 03/11/2010 at 3:45pm.  Arman Filter LPN  Feb 24, 2010 4:27 PM

## 2010-11-17 NOTE — Medication Information (Signed)
Summary: Tax adviser   Imported By: Diana Eves 10/20/2009 08:59:38  _____________________________________________________________________  External Attachment:    Type:   Image     Comment:   External Document  Appended Document: RX Folder - omeprazole    Prescriptions: OMEPRAZOLE 20 MG CPDR (OMEPRAZOLE) one by mouth 30 mins before breakfast daily  #30 x 11   Entered and Authorized by:   Leanna Battles. Dixon Boos   Signed by:   Leanna Battles Ronen Bromwell PA-C on 10/21/2009   Method used:   [facility] [address]       [address]       [place], [place]  [address]       Ph: [phone] or [phone]       Fax: [phone]   RxID:   [id number]

## 2010-11-17 NOTE — Letter (Signed)
Summary: CMN/[facility]  CMN/[facility]   Imported By: Lester [place] 09/14/2010 09:24:15  _____________________________________________________________________  External Attachment:    Type:   Image     Comment:   External Document

## 2010-11-17 NOTE — Letter (Signed)
Summary: Patient Notice, Endo Biopsy Results  [facility]  [address]   [place], [place] [address]   Phone: [phone]  Fax: [phone]       January 23, 2010   [name] [address] [place], [place]  [address] 1936-05-25    Dear [name],  I am pleased to inform you that the biopsies taken during your recent endoscopic examination did not show any evidence of cancer upon pathologic examination.  Additional information/recommendations:  Continue with the treatment plan as outlined on the day of your exam.  You should have a repeat endoscopic examination in 2 years.   Please call us if you are having persistent problems or have questions about your condition that have not been fully answered at this time.  Sincerely,    R. Roetta Sessions MD, FACP [facility] Ph: [phone]   Fax: [phone]   Appended Document: Patient Notice, Endo Biopsy Results Letter mailed to pt.

## 2010-11-17 NOTE — Letter (Addendum)
Summary: CMN request for Oximetry Test/[facility]  SMN/[facility]   Imported By: Lester [place] 08/13/2010 08:30:56  _____________________________________________________________________  External Attachment:    Type:   Image     Comment:   External Document

## 2010-11-17 NOTE — Procedures (Signed)
Summary: Respiratory eval/[facility]  Respiratory eval/[facility]   Imported By: Lester [address] 03/04/2010 10:19:33  _____________________________________________________________________  External Attachment:    Type:   Image     Comment:   External Document

## 2010-11-18 ENCOUNTER — Inpatient Hospital Stay (HOSPITAL_COMMUNITY)
Admission: EM | Admit: 2010-11-18 | Discharge: 2010-11-22 | DRG: 190 | Disposition: A | Discharge status: Home / Self Care | Payer: Medicare Other | Attending: Internal Medicine | Admitting: Internal Medicine

## 2010-11-18 ENCOUNTER — Emergency Department (HOSPITAL_COMMUNITY): Payer: Medicare Other

## 2010-11-18 ENCOUNTER — Telehealth (INDEPENDENT_AMBULATORY_CARE_PROVIDER_SITE_OTHER): Payer: Self-pay | Admitting: *Deleted

## 2010-11-18 DIAGNOSIS — F411 Generalized anxiety disorder: Secondary | ICD-10-CM | POA: Diagnosis not present

## 2010-11-18 DIAGNOSIS — E785 Hyperlipidemia, unspecified: Secondary | ICD-10-CM | POA: Diagnosis present

## 2010-11-18 DIAGNOSIS — J9620 Acute and chronic respiratory failure, unspecified whether with hypoxia or hypercapnia: Secondary | ICD-10-CM | POA: Diagnosis present

## 2010-11-18 DIAGNOSIS — J441 Chronic obstructive pulmonary disease with (acute) exacerbation: Principal | ICD-10-CM | POA: Diagnosis present

## 2010-11-18 DIAGNOSIS — I2510 Atherosclerotic heart disease of native coronary artery without angina pectoris: Secondary | ICD-10-CM | POA: Diagnosis present

## 2010-11-18 DIAGNOSIS — I10 Essential (primary) hypertension: Secondary | ICD-10-CM | POA: Diagnosis present

## 2010-11-18 DIAGNOSIS — R55 Syncope and collapse: Secondary | ICD-10-CM | POA: Diagnosis present

## 2010-11-18 DIAGNOSIS — J189 Pneumonia, unspecified organism: Secondary | ICD-10-CM | POA: Diagnosis present

## 2010-11-18 DIAGNOSIS — K219 Gastro-esophageal reflux disease without esophagitis: Secondary | ICD-10-CM | POA: Diagnosis present

## 2010-11-18 DIAGNOSIS — Z7982 Long term (current) use of aspirin: Secondary | ICD-10-CM

## 2010-11-18 DIAGNOSIS — Z951 Presence of aortocoronary bypass graft: Secondary | ICD-10-CM

## 2010-11-18 HISTORY — DX: Essential (primary) hypertension: I10

## 2010-11-18 LAB — CK TOTAL AND CKMB (NOT AT ARMC): Relative Index: 5 — ABNORMAL HIGH (ref 0.0–2.5)

## 2010-11-18 LAB — DIFFERENTIAL
Basophils Absolute: 0 10*3/uL (ref 0.0–0.1)
Basophils Relative: 0 % (ref 0–1)
Eosinophils Absolute: 0 10*3/uL (ref 0.0–0.7)
Eosinophils Relative: 0 % (ref 0–5)
Lymphocytes Relative: 8 % — ABNORMAL LOW (ref 12–46)
Lymphs Abs: 0.7 10*3/uL (ref 0.7–4.0)
Monocytes Absolute: 0.3 10*3/uL (ref 0.1–1.0)
Monocytes Relative: 4 % (ref 3–12)
Neutro Abs: 7.4 10*3/uL (ref 1.7–7.7)
Neutrophils Relative %: 88 % — ABNORMAL HIGH (ref 43–77)

## 2010-11-18 LAB — CBC
HCT: 42.4 % (ref 39.0–52.0)
Hemoglobin: 15.2 g/dL (ref 13.0–17.0)
MCH: 32.5 pg (ref 26.0–34.0)
MCHC: 35.8 g/dL (ref 30.0–36.0)
MCV: 90.6 fL (ref 78.0–100.0)
Platelets: 261 10*3/uL (ref 150–400)
RBC: 4.68 MIL/uL (ref 4.22–5.81)
RDW: 12.2 % (ref 11.5–15.5)
WBC: 8.4 10*3/uL (ref 4.0–10.5)

## 2010-11-18 LAB — BASIC METABOLIC PANEL
CO2: 26 mEq/L (ref 19–32)
Calcium: 8.7 mg/dL (ref 8.4–10.5)
Glucose, Bld: 132 mg/dL — ABNORMAL HIGH (ref 70–99)
Sodium: 133 mEq/L — ABNORMAL LOW (ref 135–145)

## 2010-11-18 LAB — POCT CARDIAC MARKERS
CKMB, poc: 3.9 ng/mL (ref 1.0–8.0)
Myoglobin, poc: 156 ng/mL (ref 12–200)
Troponin i, poc: <0.05 ng/mL (ref 0.00–0.09)

## 2010-11-18 LAB — TROPONIN I: Troponin I: 0.01 ng/mL (ref 0.00–0.06)

## 2010-11-19 ENCOUNTER — Encounter (HOSPITAL_COMMUNITY): Payer: Self-pay | Admitting: Radiology

## 2010-11-19 ENCOUNTER — Telehealth: Payer: Self-pay | Admitting: Pulmonary Disease

## 2010-11-19 ENCOUNTER — Ambulatory Visit: Payer: Self-pay | Admitting: Adult Health

## 2010-11-19 ENCOUNTER — Inpatient Hospital Stay (HOSPITAL_COMMUNITY): Payer: Medicare Other

## 2010-11-19 DIAGNOSIS — I059 Rheumatic mitral valve disease, unspecified: Secondary | ICD-10-CM

## 2010-11-19 LAB — CARDIAC PANEL(CRET KIN+CKTOT+MB+TROPI)
CK, MB: 10.1 ng/mL (ref 0.3–4.0)
Relative Index: 4.9 — ABNORMAL HIGH (ref 0.0–2.5)
Total CK: 206 U/L (ref 7–232)
Total CK: 220 U/L (ref 7–232)
Troponin I: 0.01 ng/mL (ref 0.00–0.06)

## 2010-11-19 LAB — COMPREHENSIVE METABOLIC PANEL
Albumin: 3.3 g/dL — ABNORMAL LOW (ref 3.5–5.2)
Alkaline Phosphatase: 57 U/L (ref 39–117)
Calcium: 8.3 mg/dL — ABNORMAL LOW (ref 8.4–10.5)
GFR calc Af Amer: >60 mL/min (ref >60)
GFR calc non Af Amer: >60 mL/min (ref >60)
Sodium: 135 mEq/L (ref 135–145)
Total Bilirubin: 0.2 mg/dL — ABNORMAL LOW (ref 0.3–1.2)
Total Protein: 6.8 g/dL (ref 6.0–8.3)

## 2010-11-19 LAB — CBC
MCHC: 33.6 g/dL (ref 30.0–36.0)
RDW: 12.3 % (ref 11.5–15.5)

## 2010-11-19 LAB — GLUCOSE, CAPILLARY
Glucose-Capillary: 114 mg/dL — ABNORMAL HIGH (ref 70–99)
Glucose-Capillary: 124 mg/dL — ABNORMAL HIGH (ref 70–99)
Glucose-Capillary: 126 mg/dL — ABNORMAL HIGH (ref 70–99)

## 2010-11-19 LAB — BRAIN NATRIURETIC PEPTIDE: Pro B Natriuretic peptide (BNP): 69 pg/mL (ref 0.0–100.0)

## 2010-11-19 LAB — MRSA PCR SCREENING: MRSA by PCR: NEGATIVE

## 2010-11-19 LAB — LIPID PANEL: Triglycerides: 50 mg/dL (ref <150)

## 2010-11-19 LAB — TSH: TSH: 0.672 u[IU]/mL (ref 0.350–4.500)

## 2010-11-19 MED ORDER — IOHEXOL 300 MG/ML  SOLN: 100.0000 mL | Freq: Once | INTRAMUSCULAR | Status: AC | PRN

## 2010-11-19 NOTE — Progress Notes (Signed)
Summary: refill of cough meds per pharamcy - LMTCB x 1  Phone Note From Pharmacy   Summary of Call: pt is requesting refills of cough meds---cheratussin ac syrup   1/2 to 1 tsp by mouth at bedtime.   please advise if ok to send in refills. Randell Loop CMA  October 27, 2010 4:10 PM   Follow-up for Phone Call        patients wife called back to see if the prescription for cough meds have been called back. They can be reached [phone].Vedia Coffer  October 27, 2010 4:51 PM  OK x1 Follow-up by: Comer Locket. Vassie Loll MD,  October 31, 2010 2:06 AM  Additional Follow-up for Phone Call Additional follow up Details #1::        Rx called into pharmacy.  LMOMTCB to inform pt of this. Gweneth Dimitri RN  November 02, 2010 9:37 AM     Additional Follow-up for Phone Call Additional follow up Details #2::    called and spoke with pts wife and she is aware of cough meds sent to the pharamcy Randell Loop CMA  November 02, 2010 3:26 PM   Prescriptions: CHERATUSSIN AC 100-10 MG/5ML SYRP (GUAIFENESIN-CODEINE) Take 1/2 to 1 teaspoon at bedtime  #155mL x 0   Entered by:   Gweneth Dimitri RN   Authorized by:   Comer Locket. Vassie Loll MD   Signed by:   Gweneth Dimitri RN on 11/02/2010   Method used:   Telephoned to ...       [facility]* (retail)       [address]       [place], [place]  [address]       Ph: [phone]       Fax: [phone]   RxID:   [id number]

## 2010-11-19 NOTE — Progress Notes (Signed)
Summary: pharm calling re: cough syrup  Phone Note From Pharmacy   [facility]: [facility] [place] Call For: alva  Summary of Call: pharmacy calling to request verbal order for cough syrup (this was called in to [facility] [place] but caller says pt doesn't use that pharmacy any more. call Olegario Messier at [place] [phone] (press #2) Initial call taken by: Tivis Ringer, CNA,  November 04, 2010 10:55 AM  Follow-up for Phone Call        Per pt he does want [facility] [place]. i called [facility] and spoke with Abby who advised pt did not pick up medication and she will reverse it. Sp[facility] with [facility] [place] and gave the verbal ok to fill this time only. Zackery Barefoot CMA  November 04, 2010 12:24 PM

## 2010-11-20 ENCOUNTER — Encounter: Payer: Self-pay | Admitting: Pulmonary Disease

## 2010-11-20 ENCOUNTER — Inpatient Hospital Stay (HOSPITAL_COMMUNITY): Payer: Medicare Other

## 2010-11-20 DIAGNOSIS — R55 Syncope and collapse: Secondary | ICD-10-CM

## 2010-11-20 DIAGNOSIS — R05 Cough: Secondary | ICD-10-CM

## 2010-11-20 DIAGNOSIS — J441 Chronic obstructive pulmonary disease with (acute) exacerbation: Secondary | ICD-10-CM

## 2010-11-20 LAB — BASIC METABOLIC PANEL
BUN: 13 mg/dL (ref 6–23)
CO2: 24 mEq/L (ref 19–32)
Calcium: 8.3 mg/dL — ABNORMAL LOW (ref 8.4–10.5)
Chloride: 102 mEq/L (ref 96–112)
Creatinine, Ser: 0.89 mg/dL (ref 0.4–1.5)

## 2010-11-20 LAB — GLUCOSE, CAPILLARY: Glucose-Capillary: 108 mg/dL — ABNORMAL HIGH (ref 70–99)

## 2010-11-21 LAB — GLUCOSE, CAPILLARY
Glucose-Capillary: 122 mg/dL — ABNORMAL HIGH (ref 70–99)
Glucose-Capillary: 132 mg/dL — ABNORMAL HIGH (ref 70–99)

## 2010-11-22 LAB — MAGNESIUM: Magnesium: 2.5 mg/dL (ref 1.5–2.5)

## 2010-11-22 LAB — BASIC METABOLIC PANEL
Calcium: 8.2 mg/dL — ABNORMAL LOW (ref 8.4–10.5)
GFR calc Af Amer: >60 mL/min (ref >60)
GFR calc non Af Amer: >60 mL/min (ref >60)
Potassium: 4.1 mEq/L (ref 3.5–5.1)
Sodium: 138 mEq/L (ref 135–145)

## 2010-11-22 LAB — GLUCOSE, CAPILLARY
Glucose-Capillary: 155 mg/dL — ABNORMAL HIGH (ref 70–99)
Glucose-Capillary: 101 mg/dL — ABNORMAL HIGH (ref 70–99)

## 2010-11-22 LAB — PHOSPHORUS: Phosphorus: 4 mg/dL (ref 2.3–4.6)

## 2010-11-25 NOTE — Progress Notes (Signed)
Summary: FYI  Phone Note Call from Patient Call back at [facility] Phone [phone]   Caller: Spouse//[name] Call For: [name] Summary of Call: FYI: Pt admitted to [facility] rm 4730. Initial call taken by: Darletta Moll,  November 19, 2010 1:06 PM  Follow-up for Phone Call        Will send to Dr. Vassie Loll as Lorain Childes.Michel Bickers CMA  November 19, 2010 2:51 PM

## 2010-11-25 NOTE — Assessment & Plan Note (Addendum)
Summary: hospital consult   Vital Signs:  Patient profile:   75 year old male O2 Sat:      97 % on 2 L/min Temp:     98 degrees F oral Pulse rate:   76 / minute Pulse rhythm:   regular Resp:     18 per minute BP supine:   118 / 74  O2 Flow:  2 L/min  Copy to:  self Primary Provider/Referring Provider:  Dr. Sherwood Gambler   History of Present Illness: 74/M, ex- smokerfor FU of  severe  COPD on nocturnal O2  & cough. 5/10  PFTs >> severe obstruction, FEV1 39%, no BD response  He developed a  chronic cough.since his heart surgery by [name] in 6/09. Cough drops & cough syrups only give temporary relief. Albuterol nebs & MDI are not very helpful. Barrett's esophagus 3 cm noted to be stable on recent EGD by [name].  H/o sinus surgery, tonsilectomy CXR 8/09 bibasal atelectasis with pleural calcification rt diaphragm, severe COPD. CT chest 4/09 severe emphysema, scattered sub-cm nodules stable since 2005  Mar 11, 2010  EGD uneventful. Desaturates on exertion & during sleep - started on oxygen. Using wife's albuterol. Spiriva caused sores in his mouth - now off. BP low on diuretics & metoprolol  November 20, 2010 8:27 AM  Seen 1/30 for an acute office visit. Pt c/o DOE, productive cough with gray mucus, wheezing, T-102, chest tightness x 3 days. Upon arrival pt O2 83%RA and recovered to 93% RA after rest P-101. Says that he is coughing up thick mucus mixed with yellow. Some intermittent wheezing. 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 (quit in 1994 pipe/cigars)  Review of Systems       The patient complains of prolonged cough.  The patient denies anorexia, fever, weight loss, weight gain, vision loss, decreased hearing, hoarseness, chest pain, syncope, dyspnea on exertion, peripheral edema, headaches, hemoptysis, abdominal pain, melena, hematochezia, severe indigestion/heartburn, hematuria, muscle weakness, suspicious skin lesions, difficulty walking, depression, unusual weight change, abnormal bleeding, enlarged lymph nodes, and angioedema.    Physical Exam  Additional Exam:  GEN: A/Ox3; pleasant , NAD, no accessory use, speaking in full sentences  HEENT:  [place]/AT, , EACs-clear, TMs-wnl, NOSE-clear, THROAT-clear NECK:  Supple w/ fair ROM; no JVD; normal carotid impulses w/o bruits; no thyromegaly or nodules palpated; no lymphadenopathy. RESP  Coarse BS w Burman Foster exp wheezing , no stridor  CARD:  RRR, no m/r/g , no edema  GI:   Soft & nt; nml bowel sounds; no organomegaly or masses detected. [place]: Warm bil,  no calf tenderness edema, clubbing, pulses intact Neuro: intact w/ no focal deficits noted.    Impression & Recommendations:  Problem # 1:  COUGH (ICD-786.2) severe , triggered by bronchitis/ copd flare - causing syncope Aim for cough  suppression with narcotics in addition to Rx of COPD flare  Problem # 2:  BRONCHITIS, ACUTE (ICD-466.0) CT not convincing for pneumonia - dc cipro/ zosyn  resume by mouth avelox x 7 ds His updated medication list for this problem includes:    Mucinex Maximum Strength 1200 Mg Xr12h-tab (Guaifenesin) .Marland Kitchen... As needed    Proair Hfa 108 (90 Base) Mcg/act Aers (Albuterol sulfate) ..... Inhale 2 puffs  every four hours as needed    Symbicort 80-4.5 Mcg/act Aero (Budesonide-formoterol fumarate) ..... Inhale 2 puffs two times a day    Cheratussin Ac 100-10 Mg/25ml Syrp (Guaifenesin-codeine) .Marland Kitchen... Take 1/2 to 1 teaspoon at bedtime    Albuterol Sulfate (2.5 Mg/80ml) 0.083% Nebu (Albuterol sulfate) .Marland Kitchen... As needed    Avelox 400 Mg Tabs (Moxifloxacin hcl) .Marland Kitchen... 1 by mouth once daily  Problem # 3:  C O P D (ICD-496) resume symbicort on dc Pulmicort, albuterol OK as inpt Pred 40 mg  - slow taper over 2-3 weeks Arrange out pt FU in 1 wk

## 2010-11-25 NOTE — Assessment & Plan Note (Signed)
Summary: per wife's call/head cold,congestion/mhh   Visit Type:  NP acute visit Copy to:  self Primary Provider/Referring Provider:  Dr. Sherwood Gambler  CC:  Pt c/o DOE, productive cough with gray mucus, wheezing, T-102, and chest tightness x 3 days. Upon arrival pt O2 83%RA and recovered to 93% RA after rest P-101.  History of Present Illness: 74/M, ex- smokerfor FU of  severe  COPD & cough. 5/10  PFTs >> severe obstruction, FEV1 39%, no BD response  He developed a  chronic cough.since his heart surgery by [name] in 6/09. Cough drops & cough syrups only give temporary relief. Albuterol nebs & MDI are not very helpful. Barrett's esophagus 3 cm noted to be stable on recent EGD by [name]. He is seeing a nephrologist for high potassium, no ACE (-) on med list.  H/o sinus surgery, tonsilectomy CXR 8/09 bibasal atelectasis with pleural calcification rt diaphragm, severe COPD. CT chest 4/09 severe emphysema, scattered sub-cm nodules stable since 2005  January 20, 2010  On symbicort & spiriva now. Coughing bout in th eoffice, green phlegm >> avelox  Mar 11, 2010  EGD uneventful. Desaturates on exertion & during sleep - started on oxygen. Using wife's albuterol. Spiriva caused sores in his mouth - now off. BP low on diuretics & metoprolol   May 25, 2010--Presents for 4 month follow up - states breathing is doing well. He  continues to wear O2 at bedtime and uses Symbicort two times a day.  No change in activity level. Appetite is good,   July 27, 2010 Using O2 at night only, DMe requesting O2 recertification Denies chest pain,  orthopnea, hemoptysis, fever, n/v/d, edema.   November 16, 2010 --Pt presents for an acute office visit. Pt c/o DOE, productive cough with gray mucus, wheezing, T-102, chest tightness x 3 days. Upon arrival pt O2 83%RA and recovered to 93% RA after rest P-101. Pt has O2 at home that he wears at bedtime. Says that he is coughing up thick mucus mixed with yellow. Some  intermittent wheezing. Has not been using neb machine. Appetite is good, eating well. No n/v/d. Cough is rattling. At rest his breathing is ok, if he moves around walking he wears out easily. Denies chest pain, orthopnea, hemoptysis,  n/v/d, edema, headache,recent travel .   Preventive Screening-Counseling & Management  Alcohol-Tobacco     Alcohol drinks/day: 0     Smoking Status: quit     Packs/Day: 1.0     Year Started: 42 yrs     Year Quit: 1994  Medications Prior to Update: 1)  Mucinex Maximum Strength 1200 Mg Xr12h-Tab (Guaifenesin) .... As Needed 2)  Omeprazole 20 Mg Cpdr (Omeprazole) .... One By Mouth 30 Mins Before Breakfast Daily 3)  Bayer Aspirin 325 Mg Tabs (Aspirin) .Marland Kitchen.. 1 Once Daily 4)  Proair Hfa 108 (90 Base) Mcg/act Aers (Albuterol Sulfate) .... Inhale 2 Puffs Every Four Hours As Needed 5)  Symbicort 80-4.5 Mcg/act Aero (Budesonide-Formoterol Fumarate) .... Inhale 2 Puffs Two Times A Day 6)  Crestor 40 Mg Tabs (Rosuvastatin Calcium) .... Take 1 Tab By Mouth At Bedtime 7)  Amitriptyline Hcl 100 Mg Tabs (Amitriptyline Hcl) .... Take 1 Tablet By Mouth Once A Day 8)  Cheratussin Ac 100-10 Mg/64ml Syrp (Guaifenesin-Codeine) .... Take 1/2 To 1 Teaspoon At Bedtime 9)  Oxygen .... Wear 2l/min At Bedtime 10)  Hydrochlorothiazide 25 Mg Tabs (Hydrochlorothiazide) .... Take 1 Tablet By Mouth Once A Day 11)  Sps 15 Gm/20ml Susp (Sodium  Polystyrene Sulfonate) .Marland Kitchen.. Twice Weekly 12)  Albuterol Sulfate (2.5 Mg/32ml) 0.083% Nebu (Albuterol Sulfate) .... As Needed  Current Medications (verified): 1)  Mucinex Maximum Strength 1200 Mg Xr12h-Tab (Guaifenesin) .... As Needed 2)  Omeprazole 20 Mg Cpdr (Omeprazole) .... One By Mouth 30 Mins Before Breakfast Daily 3)  Bayer Aspirin 325 Mg Tabs (Aspirin) .Marland Kitchen.. 1 Once Daily 4)  Proair Hfa 108 (90 Base) Mcg/act Aers (Albuterol Sulfate) .... Inhale 2 Puffs Every Four Hours As Needed 5)  Symbicort 80-4.5 Mcg/act Aero (Budesonide-Formoterol  Fumarate) .... Inhale 2 Puffs Two Times A Day 6)  Crestor 40 Mg Tabs (Rosuvastatin Calcium) .... Take 1 Tab By Mouth At Bedtime 7)  Amitriptyline Hcl 100 Mg Tabs (Amitriptyline Hcl) .... Take 1 Tablet By Mouth Once A Day 8)  Cheratussin Ac 100-10 Mg/51ml Syrp (Guaifenesin-Codeine) .... Take 1/2 To 1 Teaspoon At Bedtime 9)  Oxygen .... Wear 2l/min At Bedtime 10)  Hydrochlorothiazide 25 Mg Tabs (Hydrochlorothiazide) .... Take 1 Tablet By Mouth Once A Day 11)  Sps 15 Gm/71ml Susp (Sodium Polystyrene Sulfonate) .Marland Kitchen.. Twice Weekly 12)  Albuterol Sulfate (2.5 Mg/80ml) 0.083% Nebu (Albuterol Sulfate) .... 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 (quit in 1994 pipe/cigars)  Risk Factors: Smoking Status: quit (11/16/2010) Packs/Day: 1.0 (11/16/2010)  Review of Systems      See HPI  Vital Signs:  Patient profile:   75 year old male Height:      71 inches O2 Sat:      90 % on Room air Temp:     98.5 degrees F oral Pulse rate:   98 / minute BP sitting:   100 / 68  (left arm) Cuff size:   regular  Vitals Entered By: Zackery Barefoot CMA (November 16, 2010 2:37 PM)  O2 Flow:  Room air CC: Pt c/o DOE, productive cough with gray mucus, wheezing, T-102, chest tightness x 3 days. Upon arrival pt O2 83%RA and recovered to 93% RA after rest P-101 Is Patient Diabetic? No Comments Medications reviewed with patient Verified contact number and pharmacy with patient Zackery Barefoot CMA  November 16, 2010 2:37 PM     Physical Exam  Additional Exam:  GEN: A/Ox3; pleasant , NAD, no accessory use, speaking in full sentences  HEENT:  [place]/AT, , EACs-clear, TMs-wnl, NOSE-clear, THROAT-clear NECK:  Supple w/ fair ROM; no JVD; normal carotid impulses w/o bruits; no thyromegaly or nodules palpated; no lymphadenopathy. RESP  Coarse BS w Burman Foster exp wheezing , no stridor  CARD:  RRR, no m/r/g , no edema  GI:   Soft & nt; nml bowel sounds; no organomegaly or masses detected. [place]: Warm bil,  no calf tenderness edema, clubbing, pulses intact Neuro: intact w/ no focal deficits noted.    Impression & Recommendations:  Problem # 1:  C O P D (ICD-496) Exacerbation - pt has low reserve  will check xray , tx for suspected CAP w/ close follow up  Depo120mg  IM in office  Rocephin 1 gram IM in office  Plan:   Begin Avelox 400mg  once daily for 7days Prednisone taper over next week.  Mucinex DM  two times a day as needed for cough INcrease fluids and rest  follow up 3-4 days in office  Tylenol as needed  Please contact office for sooner follow up if symptoms do not improve or worsen  Wear Oxygen 24 /7 at 2 l/m   Medications Added to Medication List This Visit: 1)  Avelox 400 Mg Tabs (Moxifloxacin hcl) .Marland Kitchen.. 1 by mouth once daily 2)  Prednisone 10 Mg Tabs (Prednisone) .... 4 tabs for 2 days, then 3 tabs for 2 days, 2 tabs for 2 days, then 1 tab for 2 days, then stop  Other Orders: T-2 View CXR (71020TC) Rocephin  250mg  (G9562) Admin of Therapeutic Inj  intramuscular or subcutaneous (13086) Depo- Medrol 40mg  (J1030) Admin of Therapeutic Inj  intramuscular or subcutaneous (57846) Depo- Medrol 80mg  (J1040) Albuterol Sulfate Sol 1mg  unit dose (N6295) Nebulizer Tx (28413) Est. Patient Level IV (24401)  Patient Instructions: 1)   Begin Avelox 400mg  once daily for 7days 2)  Prednisone taper over next week.  3)  Mucinex DM two times a day as needed for cough 4)  INcrease fluids and rest  5)  follow up 3-4 days  in office  6)  Tylenol as needed  7)  Please contact office for sooner follow up if symptoms do not improve or worsen  8)  Wear Oxygen 24 /7 at 2 l/m  Prescriptions: ALBUTEROL SULFATE (2.5 MG/3ML) 0.083% NEBU (ALBUTEROL SULFATE) as needed  #90 x 3   Entered and Authorized by:   Rubye Oaks NP   Signed by:   Rubye Oaks NP on 11/16/2010   Method used:   Electronically to        [facility]* (retail)       [address]       [place], [place]  [address]       Ph: [phone]       Fax: [phone]   RxID:   [id number] PREDNISONE 10 MG TABS (PREDNISONE) 4 tabs for 2 days, then 3 tabs for 2 days, 2 tabs for 2 days, then 1 tab for 2 days, then stop  #20 x 0   Entered and Authorized by:   Rubye Oaks NP   Signed by:   Rubye Oaks NP on 11/16/2010   Method used:   Electronically to        [facility]* (retail)       [address]       [place], [place]  [address]       Ph: [phone]       Fax: [phone]   RxID:   [id number] AVELOX 400 MG TABS (MOXIFLOXACIN HCL) 1 by mouth once daily  #7 x 0   Entered and Authorized by:   Rubye Oaks NP   Signed by:   Rubye Oaks NP on 11/16/2010   Method used:   Electronically to        [facility] (retail)       [address]       [place], [place]  [address]       Ph: [phone]       Fax: [phone]   RxID:   [id number]      Medication Administration  Injection # 1:    Medication: Rocephin  250mg     Diagnosis: BRONCHITIS, ACUTE (ICD-466.0)  Route: IM    Site: LUOQ gluteus    Exp Date: 02/2012    Lot #: [id number]    Mfr: NOVAPLUS    Patient tolerated injection without complications    Given by: Zackery Barefoot CMA (November 16, 2010 3:40 PM)  Injection # 2:    Medication: Depo- Medrol 40mg     Diagnosis: BRONCHITIS, ACUTE (ICD-466.0)    Route: IM    Site: RUOQ gluteus    Exp Date: 04/2013     Lot #: [id number]    Mfr: Pharmacia    Patient tolerated injection without complications    Given by: Zackery Barefoot CMA (November 16, 2010 3:46 PM)  Injection # 3:    Medication: Depo- Medrol 80mg     Diagnosis: BRONCHITIS, ACUTE (ICD-466.0)    Route: IM    Site: RUOQ gluteus    Exp Date: 04/2013    Lot #: [id number]    Mfr: Pharmacia    Patient tolerated injection without complications    Given by: Zackery Barefoot CMA (November 16, 2010 3:47 PM)  Medication # 1:    Medication: Albuterol Sulfate Sol 1mg  unit dose    Diagnosis: BRONCHITIS, ACUTE (ICD-466.0)    Dose: 1 VIAL    Route: inhaled    Exp Date: 11/2011    Lot #: [id number]    Mfr: nephron    Patient tolerated medication without complications    Given by: Zackery Barefoot CMA (November 16, 2010 3:51 PM)  Orders Added: 1)  T-2 View CXR [71020TC] 2)  Rocephin  250mg  [J0696] 3)  Admin of Therapeutic Inj  intramuscular or subcutaneous [96372] 4)  Depo- Medrol 40mg  [J1030] 5)  Admin of Therapeutic Inj  intramuscular or subcutaneous [96372] 6)  Depo- Medrol 80mg  [J1040] 7)  Albuterol Sulfate Sol 1mg  unit dose [J7613] 8)  Nebulizer Tx [94640] 9)  Est. Patient Level IV [81191]

## 2010-11-25 NOTE — Progress Notes (Signed)
Summary: avelox  Phone Note From Pharmacy   [phone] Call For: Jonesha Tsuchiya  Summary of Call: [facility] calling saying that avelox is not covered by his insurance plan.  Asking if another anitbiotic could be called in? Initial call taken by: Lehman Prom,  November 17, 2010 9:43 AM  Follow-up for Phone Call        changed to levaquin, spoke to pharm. Follow-up by: Giannah Zavadil NP,  November 17, 2010 10:11 AM

## 2010-11-25 NOTE — Progress Notes (Signed)
Summary: difficulty breathing and possible passing out-[id number]  Phone Note Call from Patient   Caller: Spouse Call For: Vassie Loll Summary of Call: patients wife phoned stated that [name] was losing his breath and going still like he is passing out. Patient wanted to know if she should call 911 or just bring him to [facility]. I advised Ms. Nilsen to call 911 she stated that they lived in [place] and she did not want to go to [place], and she wanted to know if they would bring him to [facility] I advised that they would probably take him to the closest facility but she could ask them to bring him to [facility]. I heard [name] in the back ground telling her that she could just bring him not to call 911 I advised Ms Getman not to try to bring him to hang up and call 911 Initial call taken by: Vedia Coffer,  November 18, 2010 1:52 PM  Follow-up for Phone Call        This message was not marked urgent so just now calling the pt. There was no answer so LMTCBx1.Carron Curie CMA  November 18, 2010 3:25 PM  pt admitted will sign this msg, RA already aware.  Vernie Murders  November 19, 2010 5:31 PM

## 2010-11-29 NOTE — Discharge Summary (Signed)
[name], [name]            ACCOUNT NO.:  [id number]  MEDICAL RECORD NO.:  [id number]           PATIENT TYPE:  I  LOCATION:  6705                         FACILITY:  [facility]  PHYSICIAN:  Charlestine Massed, MDDATE OF BIRTH:  09/02/1936  DATE OF ADMISSION:  11/18/2010 DATE OF DISCHARGE:                        DISCHARGE SUMMARY - REFERRING   PRIMARY CARE PHYSICIAN:  Dr. Elfredia Nevins.  PULMONOLOGY:  Dr. Cyril Mourning.  CARDIOLOGY:  Patient has plan to see the above Cardiology at [facility].  CHIEF COMPLAINT:  Shortness of breath.  DISCHARGE DIAGNOSES: 1. Severe chronic obstructive pulmonary disease - GOLD stage IV. 2. Acute respiratory failure on top of chronic respiratory failure --     due to right middle lobe pneumonia. 3. Right middle lobe pneumonia. 4. Hypertension. 5. Hypoxemia secondary to go acute-on-chronic respiratory failure --     on home oxygen. 6. History of coronary artery disease, status post triple bypass in     2009. 7. Possible bilateral basal bronchiectasis visible on CAT scan. 8. Dyslipidemia. 9. Questionable possibility of renal tubular acidosis as per patient's     history that was mentioned to him by his nephrologist -- no     evidence of any high potassium levels any time since 2009 on line     records. 10.Hypertension. 11.Gastroesophageal reflux disease and history of Barrett's esophagus.  DISCHARGE MEDICATIONS: 1. Albuterol nebulizer 2.5 mg every 4 hourly p.r.n. for shortness of     breath. 2. Atrovent nebulizer every 2.5 mg every six hourly p.r.n. for     shortness of breath. 3. Xanax 0.25 mg p.o. t.i.d. p.r.n. for anxiety. 4. Zebeta 5 mg p.o. daily in a.m. 5. Mucinex 600 mg p.o. b.i.d. for 21 days. 6. Tussionex syrup 2.5 to 5 mL p.o. b.i.d. for dry cough. 7. Avelox 400 mg p.o. daily for 7 more days, last date is November 29, 2010. 8. Prednisone 5 mg tablets, taper by 5 mg every 2 days, to maintain on     5 mg per day at the and  continuously. 9. Zoloft 50 mg q.a.m. 10.Amitriptyline 100 mg p.o. q.h.s. 11.Enteric-coated aspirin 325 mg p.o. daily. 12.Questran 40 mg p.o. q.h.s. 13.Colace 100 mg p.o. b.i.d. as needed. 14.Guaifenesin dextromethorphan over-the-counter syrup 1 tablet p.o.     b.i.d. p.r.n. for cough. 15.Hydrochlorothiazide 25 mg p.o. q.a.m. 16.Milk of Magnesia 30 mL p.o. q.h.s. p.r.n. for constipation. 17.Omeprazole 20 mg p.o. q.a.m. 18.ProAir inhaler 2 puffs every 4 hourly p.r.n. nice to be used for     outdoor use. 19.Senna 3 tablets p.o. q.h.s. 20.Symbicort 2.80/4.5 two puffs inhaled twice daily. 21.Extra strength Tylenol 1-2 tablets every 6 hours p.r.n. for pain.  The following medications will be discontinued: 1. Kayexalate. 2. Guaifenesin. 3. Codeine. 4. Cheratussin.  HOSPITAL COURSE:  Respiratory -- the patient was admitted with significant respiratory distress due to bouts of coughing after which he passed out.  The wife states that when he was coughing he suddenly became bluish allover and he passed out and then his color back.  This happened once while in the hospital also.  He has been continued on nebulizer treatments.  A  CAT scan of the chest revealed significant emphysema with numerous emphysematous blebs in the upper zone and honeycombing in the lower zone suggestive of possible bronchiectasis. He is not producing much cough but had some sputum which is greenish in color.  He has been seen by the pulmonologist 2 days before admission and is on Avelox.  In view of the possibility of non-improvement, we started him on Zosyn and ciprofloxacin as dual pseudomonal coverage due to the question of bronchiectasis before CAT scan.  The patient was seen by the Pulmonology, Dr. Vassie Loll, also during this admission and he suggested that he can be safely continued on Avelox.  We will continue a 14-day course considering the magnitude of his lung disease.  He does not have any bacteremia,  currently has improved considerably.  His exercise status is much better.  He walked up and down the stairs with not much of shortness of breath, so he has been discharged home on home oxygen.  He was virtually alone at home before.  He will continue taking the nebulizers.  We advised him to keep home O2 saturation monitor considering the fact that his body became bluish many times, so to avoid those episodes they can monitor the O2 sat and when it goes down an increase in the oxygen delivery can be done to avoid cyanotic spells. Case manager will set that up. 1. Coronary artery disease, sinus tachycardia while in the hospital     stay -- I believe the tachycardia was mainly due to the hypoxemia     which improved but hypoxemia was improving.  Heart rate was still     up, so I started him on Zebeta 5 mg p.o. daily.  Currently, his     heart rate is very stable which is beneficial for his coronary     artery disease.  Continue aspirin and hydrochlorothiazide for the     hypertension. 2. Anxiety.  The patient was started on Zoloft and the p.r.n. Xanax     dosing following which anxiety conditions came well under control.     He is sleeping very well now at this time.  DISPOSITION:  Discharged back home.  FOLLOWUP: 1. Follow up with Dr. Cyril Mourning on December 02, 2010. 2. Follow up with Dr. Sherwood Gambler in 1 week. 3. Follow up with Cardiology -- the patient has expressed an interest     to follow up with [facility] and they will call him for     appointment.  SUBJECTIVE:  The patient is examined today and seen during ambulation, not tachypneic while ambulation.  PHYSICAL EXAMINATION:  VITAL SIGNS:  Temperature 98.4, heart rate 78, respiration 19, blood pressure 127/87, O2 sat 92%-95% on 2 L oxygen. General:  The patient awake, alert, well oriented, talking well, breathing slightly increased when he talks continuously but otherwise okay. HEAD AND NECK:  No JVD, no bruit. CHEST:   Bilateral coarse rales heard more on the right side.  No wheezing heard. CARDIAC:  S1 and S2 regular.  No murmurs. ABDOMEN:  Soft, nontender, no organomegaly. EXTREMITIES:  No pedal edema.  LABORATORY DATA: 1. BMP, sodium 130, potassium 4.0, chloride 100, bicarb 26, glucose     99, BUN 28, creatinine 0.85, and calcium 8.2. 2. Magnesium 2.5. 3. Phosphorus 4.0.  INSTRUCTIONS: 1. Fall precautions advised. 2. Oxygen use. 3. Follow up regular follow-ups. 4. Prednisone taper as advised.  Patient will be finally on a 5 mg     dose of prednisone  continuously. 5. Oxygen saturation monitor setup was done per home services at home to avoid non-detectionof further hypoxemic episodes, which in this case repeatedly made the patient cyanotic and made him passs out.  A total of 45 minutes spent on the discharge process.     Charlestine Massed, MD     UT/MEDQ  D:  11/22/2010  T:  11/22/2010  Job:  [id number]  cc:   Oretha Milch, MD Madelin Rear. Sherwood Gambler, MD  Electronically Signed by Charlestine Massed MD on 11/28/2010 11:45:09 AM

## 2010-11-30 ENCOUNTER — Encounter: Payer: Self-pay | Admitting: Pulmonary Disease

## 2010-11-30 ENCOUNTER — Encounter (INDEPENDENT_AMBULATORY_CARE_PROVIDER_SITE_OTHER): Payer: Self-pay | Admitting: *Deleted

## 2010-11-30 ENCOUNTER — Other Ambulatory Visit: Payer: Self-pay | Admitting: Pulmonary Disease

## 2010-11-30 ENCOUNTER — Other Ambulatory Visit: Payer: Medicare Other

## 2010-11-30 ENCOUNTER — Ambulatory Visit (INDEPENDENT_AMBULATORY_CARE_PROVIDER_SITE_OTHER): Payer: Medicare Other | Admitting: Pulmonary Disease

## 2010-11-30 DIAGNOSIS — F4323 Adjustment disorder with mixed anxiety and depressed mood: Secondary | ICD-10-CM | POA: Insufficient documentation

## 2010-11-30 DIAGNOSIS — J209 Acute bronchitis, unspecified: Secondary | ICD-10-CM

## 2010-11-30 DIAGNOSIS — I10 Essential (primary) hypertension: Secondary | ICD-10-CM

## 2010-11-30 DIAGNOSIS — J449 Chronic obstructive pulmonary disease, unspecified: Secondary | ICD-10-CM

## 2010-11-30 LAB — BASIC METABOLIC PANEL
BUN: 25 mg/dL — ABNORMAL HIGH (ref 6–23)
Calcium: 8.9 mg/dL (ref 8.4–10.5)
Chloride: 96 mEq/L (ref 96–112)
Creatinine, Ser: 1 mg/dL (ref 0.4–1.5)
GFR: 81.77 mL/min (ref >60.00)

## 2010-12-02 ENCOUNTER — Telehealth: Payer: Self-pay | Admitting: Pulmonary Disease

## 2010-12-09 NOTE — Progress Notes (Signed)
Summary: results  Phone Note Call from Patient Call back at Home Phone [phone]   Caller: Spouse Call For: [name] Summary of Call: wants lab results Initial call taken by: Tivis Ringer, CNA,  December 02, 2010 10:35 AM  Follow-up for Phone Call        pt wife advsied of lab results per append and to have pt restart kayexalate twice weekly and reck labs in 1 week. Carron Curie CMA  December 02, 2010 12:21 PM

## 2010-12-09 NOTE — Letter (Signed)
Summary: [facility] denial/[facility]  [facility] denial/[facility]   Imported By: Lester [place] 11/24/2010 07:48:18  _____________________________________________________________________  External Attachment:    Type:   Image     Comment:   External Document

## 2010-12-09 NOTE — Assessment & Plan Note (Signed)
Summary: rov per Dr. [name]//jd   Copy to:  self Primary Provider/Referring Provider:  Dr. Sherwood Gambler  CC:  follow-up visit.  History of Present Illness: 74/M, ex- smokerfor FU of  severe  COPD on nocturnal O2  & cough. 5/10  PFTs >> severe obstruction, FEV1 39%, no BD response  He developed a  chronic cough.since his heart surgery by [name] in 6/09. Cough drops & cough syrups only give temporary relief. Albuterol nebs & MDI are not very helpful. Barrett's esophagus 3 cm noted to be stable on recent EGD by [name].  H/o sinus surgery, tonsilectomy CXR 8/09 bibasal atelectasis with pleural calcification rt diaphragm, severe COPD. CT chest 4/09 severe emphysema, scattered sub-cm nodules stable since 2005  Mar 11, 2010  EGD uneventful. Desaturates on exertion & during sleep - started on oxygen. Using wife's albuterol.  BP low on diuretics & metoprolol  November 20, 2010 8:27 AM  Seen 1/30 for an acute office visit. Pt c/o DOE, productive cough with gray mucus, wheezing, T-102, chest tightness x 3 days. Upon arrival pt O2 83%RA and recovered to 93% RA after rest P-101. Says that he is coughing up thick mucus mixed with yellow. Some intermittent wheezing. Given avelox & pred BIBEMS due to cough syncope - admitted to tele but transferred to 2900 for another syncopal event CT chest neg PE, stable nodules, Rxed for HCAP with cipro & zosyn Modified Ba swallow nml  November 30, 2010 2:04 PM  Feeling much better , Full med rec performd, multiple changes to his meds had been made at the hospital He is requesting referral to renal, breathing good, Off O2 d aytime, uses this at night, they want a snmaller concentrator, SYmbicort did not help - he has stopped taking this. Placed on zoloft & xanax , amitryptiline stopped     Preventive Screening-Counseling & Management  Alcohol-Tobacco     Alcohol drinks/day: 0     Smoking Status: quit     Packs/Day: 1.0     Year Started: 42 yrs     Year Quit:  1994  Current Medications (verified): 1)  Mucinex 600 Mg Xr12h-Tab (Guaifenesin) .... Take 1 Tablet By Mouth Two Times A Day 2)  Omeprazole 20 Mg Cpdr (Omeprazole) .... One By Mouth 30 Mins Before Breakfast Daily 3)  Bayer Aspirin 325 Mg Tabs (Aspirin) .Marland Kitchen.. 1 Once Daily 4)  Proair Hfa 108 (90 Base) Mcg/act Aers (Albuterol Sulfate) .... Inhale 2 Puffs Every Four Hours As Needed 5)  Crestor 40 Mg Tabs (Rosuvastatin Calcium) .... Take 1 Tab By Mouth At Bedtime 6)  Cheratussin Ac 100-10 Mg/31ml Syrp (Guaifenesin-Codeine) .... Take 1/2 To 1 Teaspoon At Bedtime 7)  Oxygen .... Wear 2l/min At Bedtime 8)  Hydrochlorothiazide 25 Mg Tabs (Hydrochlorothiazide) .... Take 1 Tablet By Mouth Once A Day 9)  Albuterol Sulfate (2.5 Mg/7ml) 0.083% Nebu (Albuterol Sulfate) .... As Needed 10)  Avelox 400 Mg Tabs (Moxifloxacin Hcl) .Marland Kitchen.. 1 By Mouth Once Daily 11)  Prednisone 10 Mg Tabs (Prednisone) .... 4 Tabs For 2 Days, Then 3 Tabs For 2 Days, 2 Tabs For 2 Days, Then 1 Tab For 2 Days, Then Stop 12)  Zebeta 5 Mg Tabs (Bisoprolol Fumarate) .... Take 1 Tablet By Mouth Once A Day 13)  Ipratropium Bromide 0.03 % Soln (Ipratropium Bromide) .... Two Times A Day 14)  Albuterol Sulfate (2.5 Mg/82ml) 0.083% Nebu (Albuterol Sulfate) .... 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 (quit in 1994 pipe/cigars)  Review of Systems       The patient complains of dyspnea on exertion.  The patient denies anorexia, fever, weight loss, weight gain, vision loss, decreased hearing, hoarseness, chest pain, syncope, peripheral edema, prolonged cough, headaches, hemoptysis, abdominal pain, melena, hematochezia, severe  indigestion/heartburn, hematuria, muscle weakness, suspicious skin lesions, difficulty walking, depression, unusual weight change, abnormal bleeding, enlarged lymph nodes, and angioedema.    Vital Signs:  Patient profile:   75 year old male Height:      71 inches Weight:      142 pounds BMI:     19.88 O2 Sat:      94 % on Room air Temp:     97.9 degrees F oral Pulse rate:   63 / minute BP sitting:   114 / 62  (left arm) Cuff size:   regular  Vitals Entered By: Zackery Barefoot CMA (November 30, 2010 1:35 PM)  O2 Flow:  Room air CC: follow-up visit Comments Medications reviewed with patient Verified contact number and pharmacy with patient Zackery Barefoot CMA  November 30, 2010 1:37 PM    Physical Exam  Additional Exam:  GEN: A/Ox3; pleasant , NAD, no accessory use, speaking in full sentences  HEENT:  /AT, , EACs-clear, TMs-wnl, NOSE-clear, THROAT-clear NECK:  Supple w/ fair ROM; no JVD; normal carotid impulses w/o bruits; no thyromegaly or nodules palpated; no lymphadenopathy. RESP  Coarse BS w Burman Foster exp wheezing , no stridor  CARD:  RRR, no m/r/g , no edema  GI:   Soft & nt; nml bowel sounds; no organomegaly or masses detected. Musco: Warm bil,  no calf tenderness edema, clubbing, pulses intact Neuro: intact w/ no focal deficits noted.    Impression & Recommendations:  Problem # 1:  C O P D (ICD-496) Resume spiriva  - he did not feel any improvement with symbicort ct albuterol as needed dc Ipratropium Add pulmicort on future visit  Problem # 2:  BRONCHITIS, ACUTE (ICD-466.0) Taper prednisone to off Last day of avelox today The following medications were removed from the medication list:    Symbicort 80-4.5 Mcg/act Aero (Budesonide-formoterol fumarate) ..... Inhale 2 puffs two times a day His updated medication list for this problem includes:    Mucinex 600 Mg Xr12h-tab (Guaifenesin) .Marland Kitchen... Take 1 tablet by mouth two times a day    Proair Hfa 108 (90 Base)  Mcg/act Aers (Albuterol sulfate) ..... Inhale 2 puffs every four hours as needed    Cheratussin Ac 100-10 Mg/37ml Syrp (Guaifenesin-codeine) .Marland Kitchen... Take 1/2 to 1 teaspoon at bedtime    Albuterol Sulfate (2.5 Mg/74ml) 0.083% Nebu (Albuterol sulfate) .Marland Kitchen... As needed    Avelox 400 Mg Tabs (Moxifloxacin hcl) .Marland Kitchen... 1 by mouth once daily    Spiriva Handihaler 18 Mcg Caps (Tiotropium bromide monohydrate) ..... Once daily  Orders: Est. Patient Level V ([address])  Problem # 3:  HYPERTENSION (ICD-401.9) He requests referral to renal Unclear why he is on kayexalate - will rechk K today  His updated medication list for this problem includes:    Hydrochlorothiazide 25 Mg Tabs (Hydrochlorothiazide) .Marland Kitchen... Take 1 tablet by mouth once a day    Zebeta 5 Mg Tabs (Bisoprolol fumarate) .Marland Kitchen... Take 1 tablet by mouth once a day  Orders: TLB-BMP (Basic Metabolic Panel-BMET) (80048-METABOL) Est. Patient Level  V [phone]) Nephrology Referral (Nephro)  Problem # 4:  ADJ DISORDER WITH MIXED ANXIETY & DEPRESSED MOOD (ICD-309.28) change to prn, Can stop xanax once tabs done Elavil was stopped due to ? syncopal episode  - defer to Dr Sherwood Gambler  Medications Added to Medication List This Visit: 1)  Mucinex 600 Mg Xr12h-tab (Guaifenesin) .... Take 1 tablet by mouth two times a day 2)  Zebeta 5 Mg Tabs (Bisoprolol fumarate) .... Take 1 tablet by mouth once a day 3)  Ipratropium Bromide 0.03 % Soln (Ipratropium bromide) .... Two times a day 4)  Albuterol Sulfate (2.5 Mg/20ml) 0.083% Nebu (Albuterol sulfate) .... As needed 5)  Spiriva Handihaler 18 Mcg Caps (Tiotropium bromide monohydrate) .... Once daily  Other Orders: DME Referral (DME)  Patient Instructions: 1)  Copy sent to: Dr Sherwood Gambler, [facility] 2)  Please schedule a follow-up appointment in 1 month with TP 3)  Trial of spiriva - Rx sent 4)  Stop taking Ipratropium, ct albuterol as needed  5)  Take xanax only as needed  6)  Use oxygen during sleep 7)  Ask dr Sherwood Gambler  about amitryptiline , OK to get back on this in the meantime,  8)  Check oxygen levels at night during sleep Prescriptions: SPIRIVA HANDIHALER 18 MCG CAPS (TIOTROPIUM BROMIDE MONOHYDRATE) once daily  #30 x 2   Entered and Authorized by:   Comer Locket Vassie Loll MD   Signed by:   Comer Locket Vassie Loll MD on 11/30/2010   Method used:   [facility]        [facility]  [place] [address]       [address]       [place], [place]  [address]       Ph: [phone] or [phone]       Fax: [phone]   RxID:   [id number]      Appended Document: rov per Dr. [name]//jd Ambulatory Pulse Oximetry  Resting; HR__61___    02 Sat__94%RA___  Lap1 (185 feet)   HR__74___   02 Sat_94%RA____ Lap2 (185 feet)   HR__78___   02 Sat_95%RA____    Lap3 (185 feet)   HR__80___   02 Sat_91%RA____  _X__Test Completed without Difficulty ___Test Stopped due to:

## 2010-12-14 NOTE — H&P (Signed)
[name], [name]            ACCOUNT NO.:  [id number]  MEDICAL RECORD NO.:  [id number]           PATIENT TYPE:  E  LOCATION:  [facility]                         FACILITY:  [facility]  PHYSICIAN:  Eduard Clos, MDDATE OF BIRTH:  1936/02/19  DATE OF ADMISSION:  11/18/2010 DATE OF DISCHARGE:                             HISTORY & PHYSICAL   PRIMARY CARE PHYSICIAN:  Madelin Rear. Sherwood Gambler, MD  PULMONOLOGIST:  Comer Locket Vassie Loll, MD  CHIEF COMPLAINT:  Shortness of breath.  HISTORY OF PRESENTING ILLNESS:  A 75 year old male with known history of COPD on home O2, history of CAD status post CABG, hypertension, Barrett esophagus,  hyperlipidemia has been experiencing shortness of breath over the last 1 week.  He had gone to his pulmonary specialist 2 days ago when he was prescribed Avelox and prednisone despite taking which he is still short of breath.  The shortness of breath is continuing even at rest, has some cough and phlegm, subjective feeling of fever and chills. Denies any chest pain, nausea, vomiting, abdominal pain, diffuse discharge, diarrhea.  Denies any dizziness.  Today the patient had repeated spells of cough after which he had a syncopal attacks which lasted for a few seconds.  Each of them were preceded by coughing spells, did not have any incontinence of urine or tongue bite, or any seizure-like activity.  MEDICAL HISTORY: 1. COPD. 2. Hypertension. 3. Barrett esophagus. 4. Hyperlipidemia.  PAST SURGICAL HISTORY:  CABG and perforated ulcer.  MEDICATIONS PER ADMISSION: 1. The patient was just started on  Avelox and Prednisone 2 days ago. 2. Mucinex. 3. Omeprazole. 4. Aspirin 325 daily. 5. ProAir. 6. Symbicort 80/4.5 two puffs b.i.d. 7. Crestor 40 daily. 8. Amitriptyline 100 mg daily. 9. Cheratussin. 10.Oxygen. 11.Hydrochlorothiazide.  ALLERGIES:  No known drug allergies.  FAMILY HISTORY:  Nothing contributory.  SOCIAL HISTORY:  The patient quit smoking in  1999.  Denies any alcohol or drug abuse, married, lives with his wife.  REVIEW OF SYSTEMS:  As present in history of presenting illness, nothing else significant.  PHYSICAL EXAMINATION:  GENERAL:  The patient is examined at bedside, not in acute distress. VITAL SIGNS:  Blood pressure 133/70, pulse 98 per minute, temperature 97.7, respiration 18, O2 sat 94 %. HEENT:  Anicteric.  No pallor.  No discharge from ears, eyes, nose, or mouth. CHEST:  Bilateral air entry present.  Bilateral expiratory wheezes.  No crepitation. HEART:  S1, S2 heard. ABDOMEN:  Soft, nontender.  Bowel sounds heard. CNS:  Alert, awake, oriented to time, place, and person, moves upper and lower extremities 5/5. EXTREMITIES:  Peripheral pulses felt.  No edema.  LABORATORY DATA:  EKG shows normal sinus rhythm with possible premature atrial complexes with aberrant conduction, poor R-wave progression with nonspecific ST-T abnormalities and PVCs.  QRS 108 msec, QTC is 436 msec. Chest x-ray shows chronic emphysema and bibasilar scarring.  No activedisease.  CBC; WBCs 8.4, hemoglobin is 15.2, hematocrit is 42.4, platelets 261, neutrophils 88%.  Basic metabolic panel; sodium 133, potassium 3.3, chloride 94, carbon dioxide 26, glucose 132, BUN 16, creatinine 0.9, calcium 8.7, CK-MB 3.9, troponin less than 0.05, myoglobin 156.  ASSESSMENT:  1. Chronic obstructive pulmonary disease exacerbation. 2. Syncope, probably cough syncope. 3. History of coronary artery disease status post coronary artery     bypass graft. 4. History of hypertension. 5. History of Barrett esophagus. 6. History of hyperlipidemia.  PLAN: 1. At this time I will admit the patient to telemetry. 2. For his shortness of breath which most likely is from COPD     exacerbation, the patient will be on nebulizer, Pulmicort, IV Solu-     Medrol, changed slowly to p.o. prednisone and antibiotics. 3. History of CAD status post CABG.  The patient at this  time has no     chest pain.  Any case I am going to check BNP and cycle cardiac     markers.  We will continue his aspirin.  The patient is not on     metoprolol, probably because of his COPD. 4. Syncope, probably cough syncope.  At this time, we will observe in     Telemetry.  Rule out any arrhythmias.  We will closely watch him     for further syncopal attacks. At this time we are going to replace     his potassium.     We will check mag levels in a.m. 5. Hyperlipidemia.  We will continue his present medication. 6. Further recommendation as condition evolves.  The patient is a full     code.     Eduard Clos, MD     ANK/MEDQ  D:  11/18/2010  T:  11/18/2010  Job:  [id number]  cc:   Madelin Rear. Sherwood Gambler, MD Oretha Milch, MD  Electronically Signed by Midge Minium MD on 12/14/2010 04:43:21 PM

## 2010-12-18 ENCOUNTER — Encounter: Payer: Self-pay | Admitting: Pulmonary Disease

## 2010-12-21 ENCOUNTER — Telehealth: Payer: Self-pay | Admitting: Adult Health

## 2010-12-28 ENCOUNTER — Encounter: Payer: Self-pay | Admitting: Adult Health

## 2010-12-28 ENCOUNTER — Ambulatory Visit (INDEPENDENT_AMBULATORY_CARE_PROVIDER_SITE_OTHER): Payer: Medicare Other | Admitting: Adult Health

## 2010-12-28 DIAGNOSIS — J449 Chronic obstructive pulmonary disease, unspecified: Secondary | ICD-10-CM

## 2010-12-29 NOTE — Progress Notes (Signed)
Summary: Cheratussin Refill request  Phone Note Refill Request Message from:  Pharmacy on December 21, 2010 10:07 AM  Refills Requested: Medication #1:  CHERATUSSIN AC 100-10 MG/5ML SYRP Take 1/2 to 1 teaspoon at bedtime   Dosage confirmed as above?Dosage Confirmed   Brand Name Necessary? No   Supply Requested: 1 month   Last Refilled: 11/04/2010   Notes: [name] [place]  (p[facility]  [phone],  (f)  [phone]   Method Requested: Telephone to Pharmacy Next Appointment Scheduled: 12/28/2010 w/ Vicki Chaffin Initial call taken by: Michel Bickers CMA,  December 21, 2010 10:07 AM  Follow-up for Phone Call        TP, please advise if okay to send refill. Dr. Vassie Loll is out of the office this week but is scheduled to see you next week.Michel Bickers CMA  December 21, 2010 10:08 AM  Additional Follow-up for Phone Call Additional follow up Details #1::        that is fine no refils  Additional Follow-up by: Rubye Oaks NP,  December 21, 2010 10:39 AM    Additional Follow-up for Phone Call Additional follow up Details #2::    Prescription called to pharmacy. Zackery Barefoot CMA  December 21, 2010 3:11 PM   Prescriptions: CHERATUSSIN AC 100-10 MG/5ML SYRP (GUAIFENESIN-CODEINE) Take 1/2 to 1 teaspoon at bedtime  #187mL x 0   Entered by:   Zackery Barefoot CMA   Authorized by:   Rubye Oaks NP   Signed by:   Zackery Barefoot CMA on 12/21/2010   Method used:   Telephoned to ...       [facility]  [place] [address] (retail)       [address]       [place], [place]  [address]       Ph: [phone] or [phone]       Fax: [phone]   RxID:   [id number]

## 2010-12-31 ENCOUNTER — Ambulatory Visit (INDEPENDENT_AMBULATORY_CARE_PROVIDER_SITE_OTHER): Payer: Medicare Other | Admitting: Otolaryngology

## 2010-12-31 DIAGNOSIS — J310 Chronic rhinitis: Secondary | ICD-10-CM

## 2010-12-31 DIAGNOSIS — R05 Cough: Secondary | ICD-10-CM

## 2010-12-31 LAB — DIFFERENTIAL
Basophils Relative: 1 % (ref 0–1)
Eosinophils Absolute: 0.1 10*3/uL (ref 0.0–0.7)
Lymphs Abs: 1.9 10*3/uL (ref 0.7–4.0)
Neutrophils Relative %: 71 % (ref 43–77)

## 2010-12-31 LAB — CBC
MCH: 32.3 pg (ref 26.0–34.0)
MCHC: 33.7 g/dL (ref 30.0–36.0)
Platelets: 276 10*3/uL (ref 150–400)
RBC: 4.6 MIL/uL (ref 4.22–5.81)

## 2010-12-31 LAB — BASIC METABOLIC PANEL
CO2: 25 mEq/L (ref 19–32)
Calcium: 8.4 mg/dL (ref 8.4–10.5)
Creatinine, Ser: 0.72 mg/dL (ref 0.4–1.5)
GFR calc Af Amer: >60 mL/min (ref >60)
GFR calc non Af Amer: >60 mL/min (ref >60)

## 2011-01-05 NOTE — Procedures (Signed)
Summary: Oximetry/[facility]  Oximetry/[facility]   Imported By: Sherian Rein 01/01/2011 07:54:55  _____________________________________________________________________  External Attachment:    Type:   Image     Comment:   External Document

## 2011-01-05 NOTE — Assessment & Plan Note (Signed)
Summary: NP follow up visit   Copy to:  self Primary Provider/Referring Provider:  Dr. Sherwood Gambler  CC:  1 month follow up. pt c/o increased sob, cough w/ occas white phlem, wheezing, and sore stomach from so much coughing.  History of Present Illness: 75/M, ex- smokerfor FU of  severe  COPD on nocturnal O2  & cough. 5/10  PFTs >> severe obstruction, FEV1 39%, no BD response  He developed a  chronic cough.since his heart surgery by [name] in 6/09. Cough drops & cough syrups only give temporary relief. Albuterol nebs & MDI are not very helpful. Barrett's esophagus 3 cm noted to be stable on recent EGD by [name].  H/o sinus surgery, tonsilectomy CXR 8/09 bibasal atelectasis with pleural calcification rt diaphragm, severe COPD. CT chest 4/09 severe emphysema, scattered sub-cm nodules stable since 2005  Mar 11, 2010  EGD uneventful. Desaturates on exertion & during sleep - started on oxygen. Using wife's albuterol.  BP low on diuretics & metoprolol  November 20, 2010 8:27 AM  Seen 1/30 for an acute office visit. Pt c/o DOE, productive cough with gray mucus, wheezing, T-102, chest tightness x 3 days. Upon arrival pt O2 83%RA and recovered to 93% RA after rest P-101. Says that he is coughing up thick mucus mixed with yellow. Some intermittent wheezing. Given avelox & pred BIBEMS due to cough syncope - admitted to tele but transferred to 2900 for another syncopal event CT chest neg PE, stable nodules, Rxed for HCAP with cipro & zosyn Modified Ba swallow nml  November 30, 2010 2:04 PM  Feeling much better , Full med rec performd, multiple changes to his meds had been made at the hospital He is requesting referral to renal, breathing good, Off O2 d aytime, uses this at night, they want a snmaller concentrator, SYmbicort did not help - he has stopped taking this. Placed on zoloft & xanax , amitryptiline stopped>>spiriva added     December 28, 2010 --Presents for follow up. Last ov spiriva was added.  Continues to have some desaturations with walking down to 85-86%.  1 month follow up. pt c/o increased sob, cough w/ occas white phlem, wheezing, sore stomach from so much coughing. He has been referred to ENT by his PCP to evaluate this cough. Last CXR 11/18/10 with no active infiltrate. He admits to not taking his inhalers as recommended. Denies chest pain, orthopnea, hemoptysis, fever, n/v/d, edema, headache.   Current Medications (verified): 1)  Mucinex 600 Mg Xr12h-Tab (Guaifenesin) .... Take 1 Tablet By Mouth Two Times A Day 2)  Omeprazole 20 Mg Cpdr (Omeprazole) .... One By Mouth 30 Mins Before Breakfast Daily 3)  Bayer Aspirin 325 Mg Tabs (Aspirin) .Marland Kitchen.. 1 Once Daily 4)  Proair Hfa 108 (90 Base) Mcg/act Aers (Albuterol Sulfate) .... Inhale 2 Puffs Every Four Hours As Needed 5)  Crestor 40 Mg Tabs (Rosuvastatin Calcium) .... Take 1 Tab By Mouth At Bedtime 6)  Cheratussin Ac 100-10 Mg/69ml Syrp (Guaifenesin-Codeine) .... Take 1/2 To 1 Teaspoon At Bedtime 7)  Oxygen .... Wear 2l/min At Bedtime 8)  Hydrochlorothiazide 25 Mg Tabs (Hydrochlorothiazide) .... Take 1 Tablet By Mouth Once A Day 9)  Albuterol Sulfate (2.5 Mg/42ml) 0.083% Nebu (Albuterol Sulfate) .... As Needed 10)  Zebeta 5 Mg Tabs (Bisoprolol Fumarate) .... Take 1 Tablet By Mouth Once A Day 11)  Albuterol Sulfate (2.5 Mg/44ml) 0.083% Nebu (Albuterol Sulfate) .... As Needed 12)  Spiriva Handihaler 18 Mcg Caps (Tiotropium Bromide Monohydrate) .... Once  Daily 13)  Symbicort 80-4.5 Mcg/act Aero (Budesonide-Formoterol Fumarate) .... 2 Puffs Two Times A Day 14)  Albuterol Sulfate 2 Mg Tabs (Albuterol Sulfate) .... Once Daily 15)  Amitriptyline Hcl 100 Mg Tabs (Amitriptyline Hcl) .... Once Daily At Bedtime 16)  Sps 15 Gm/70ml Susp (Sodium Polystyrene Sulfonate) .Marland Kitchen.. 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 (quit in 1994 pipe/cigars)  Risk Factors: Smoking Status: quit (11/30/2010) Packs/Day: 1.0 (11/30/2010)  Review of Systems      See HPI  Vital Signs:  Patient profile:   75 year old male Height:      71 inches Weight:      148.13 pounds O2 Sat:      92 % on Room air Temp:     98.1 degrees F oral Pulse rate:   68 / minute BP sitting:   112 / 62  (right arm) Cuff size:   regular  Vitals Entered By: Carver Fila (December 28, 2010 2:10 PM)  O2 Flow:  Room air CC: 1 month follow up. pt c/o increased sob, cough w/ occas white phlem, wheezing, sore stomach from so much coughing Comments meds and allergies updated Phone number updated Mindy Silva  December 28, 2010 2:11 PM    Physical Exam  Additional Exam:  GEN: A/Ox3; pleasant , NAD, no accessory use HEENT:  [place]/AT, , EACs-clear, TMs-wnl, NOSE-clear, THROAT-clear NECK:  Supple w/ fair ROM; no JVD; normal carotid impulses w/o bruits; no thyromegaly or nodules palpated; no lymphadenopathy. RESP  Coarse BS w/ no wheezing  CARD:  RRR, no m/r/g , no edema  GI:   Soft & nt; nml bowel sounds; no organomegaly or masses detected. Musco: Warm bil,  no calf tenderness edema, clubbing, pulses intact Neuro: intact w/ no focal deficits noted.    Impression & Recommendations:  Problem # 1:  C O P D (ICD-496) Slowly resolving flare with recent PNA  He does have some desats with walking wil add O2 as needed walking.  Plan;  'Wear O2 2 l/m with walking and at bedtime  Mucinex DM two times a day as needed cough/congestion  Continue  Spriiva once daily  follow up Dr. Vassie Loll in 6-8 weeks  Please contact office for sooner follow up if symptoms do not improve or worsen   Medications Added to Medication List This Visit: 1)  Symbicort 80-4.5 Mcg/act Aero (Budesonide-formoterol fumarate) .... 2 puffs two times a day 2)  Albuterol Sulfate 2 Mg Tabs (Albuterol sulfate) .... Once daily 3)  Amitriptyline Hcl 100 Mg Tabs (Amitriptyline hcl) .... Once daily at bedtime 4)  Sps 15 Gm/69ml Susp (Sodium polystyrene sulfonate) .Marland Kitchen.. 120 ml 2 times a week  Other Orders: DME Referral (DME) Est. Patient Level III ([address])  Patient Instructions: 1)  Wear O2 2 l/m with walking and at bedtime  2)  Mucinex DM two times a day as needed cough/congestion  3)  Continue Spriiva once daily  4)  follow up Dr. Vassie Loll in 6-8 weeks  5)  Please contact office for sooner follow up if symptoms do not improve or worsen  Prescriptions: [facility]  HANDIHALER 18 MCG CAPS (TIOTROPIUM BROMIDE MONOHYDRATE) once daily  #30 x 5   Entered and Authorized by:   Rubye Oaks NP   Signed by:   Tammy Parrett NP on 12/28/2010   Method used:   [facility]        [facility]  [place] [address] [facility]       [address]       [place], [place]  [address]       Ph: [phone] or [phone]       Fax: [phone]   RxID:   [id number]   Appended Document: NP follow up visit ONO shows low O2 levela for > 1 hr during sleep, stay on O2  Appended Document: NP follow up visit I informed pt's wife of above.

## 2011-01-13 ENCOUNTER — Ambulatory Visit (INDEPENDENT_AMBULATORY_CARE_PROVIDER_SITE_OTHER): Payer: Medicare Other | Admitting: Cardiology

## 2011-01-13 ENCOUNTER — Encounter: Payer: Self-pay | Admitting: Cardiology

## 2011-01-13 VITALS — BP 104/69 | HR 84 | Ht 71.0 in | Wt 143.0 lb

## 2011-01-13 DIAGNOSIS — I2510 Atherosclerotic heart disease of native coronary artery without angina pectoris: Secondary | ICD-10-CM

## 2011-01-13 DIAGNOSIS — E785 Hyperlipidemia, unspecified: Secondary | ICD-10-CM

## 2011-01-13 DIAGNOSIS — I10 Essential (primary) hypertension: Secondary | ICD-10-CM

## 2011-01-13 MED ORDER — PRAVASTATIN SODIUM 80 MG PO TABS: 80.0000 mg | ORAL_TABLET | Freq: Every day | ORAL | Status: DC

## 2011-01-13 MED ORDER — EQL FISH OIL 1000 MG PO CAPS: 2.0000 | ORAL_CAPSULE | Freq: Every day | ORAL | Status: AC

## 2011-01-13 NOTE — Patient Instructions (Signed)
Your physician recommends that you schedule a follow-up appointment in:9 months Your physician has recommended you make the following change in your medication: decrease aspirin to 81mg  daily, change crestor to pravastatin to 80mg  daily with next refill, fish oil 1000mg  2 tablets daily, stop avelox and bisoprolol Your physician recommends that you return for lab work in:3 months

## 2011-01-14 ENCOUNTER — Encounter: Payer: Self-pay | Admitting: Internal Medicine

## 2011-01-14 ENCOUNTER — Encounter: Payer: Self-pay | Admitting: Cardiology

## 2011-01-14 DIAGNOSIS — I2510 Atherosclerotic heart disease of native coronary artery without angina pectoris: Secondary | ICD-10-CM | POA: Insufficient documentation

## 2011-01-14 NOTE — Assessment & Plan Note (Signed)
Total and LDL cholesterol have been extremely low with current therapy.  In order to reduce the cost of his treatment, pravastatin 80 mg q.d. will be substituted for rosuvastatin.  If that proves to inadequate, atorvastatin will be utilized.  Lipid profile will be repeated in 2 months.

## 2011-01-14 NOTE — Assessment & Plan Note (Signed)
[name] has done very well since essentially uncomplicated CABG surgery nearly 3 years ago.  Our focus will be optimal management of risk factors.

## 2011-01-14 NOTE — Progress Notes (Signed)
HPI:  [name] is seen for initial evaluation following cardiology care received at [facility] since 2009, as summarized above.  He has done well since his cardiac surgery, principally limited by class III exertional dyspnea, which is likely related to his chronic obstructive pulmonary disease.  He rarely experiences chest discomfort.  He has had no orthopnea, PND or syncope.  He reports a chronic cough productive of mucoid sputum, malaise and easy fatigability since recovering from pneumonia in January.  Extensive records were obtained from [facility] and [place] and reviewed.  Current Outpatient Prescriptions on File Prior to Visit  Medication Sig Dispense Refill  . albuterol (PROAIR HFA) 108 (90 BASE) MCG/ACT inhaler Inhale 2 puffs into the lungs every 4 (four) hours as needed.        Marland Kitchen albuterol (PROVENTIL) (2.5 MG/3ML) 0.083% nebulizer solution Take 2.5 mg by nebulization every 6 (six) hours as needed.        Marland Kitchen guaiFENesin (MUCINEX) 600 MG 12 hr tablet Take 1,200 mg by mouth 2 (two) times daily.        Marland Kitchen guaiFENesin-codeine (ROBITUSSIN AC) 100-10 MG/5ML syrup Take 2.5-5 mLs by mouth at bedtime.        . hydrochlorothiazide 25 MG tablet Take 25 mg by mouth daily.        Marland Kitchen omeprazole (PRILOSEC) 20 MG capsule Take 20 mg by mouth daily. 30 minutes before breakfast       . tiotropium (SPIRIVA) 18 MCG inhalation capsule Place 18 mcg into inhaler and inhale daily.        . Omega-3 Fatty Acids (EQL FISH OIL) 1000 MG CAPS Take 2 capsules (2,000 mg total) by mouth daily.  60 capsule  3    No Known Allergies  Past Medical History  Diagnosis Date  . Hypertension   . Hyperlipidemia   . Kidney failure   . Barrett's esophagus   . Emphysema   . Bronchitis, acute   . Cough   . COPD (chronic obstructive pulmonary disease)     Past Surgical History  Procedure Date  . Colon surgery   . Laparoscopic gastrotomy w/ repair of ulcer     1999  . Coronary artery bypass graft 03/2008      Family History  Problem Relation Age of Onset  . Heart disease Mother   . Heart disease Father     History   Social History  . Marital Status: Married    Spouse Name: N/A    Number of Children: 2  . Years of Education: N/A   Occupational History  . retired     Animal nutritionist   Social History Main Topics  . Smoking status: Former Smoker -- 1.0 packs/day    Types: Cigarettes, Pipe, Cigars    Quit date: 01/12/1998  . Smokeless tobacco: Never Used  . Alcohol Use: No  . Drug Use: No  . Sexually Active: Not on file   Other Topics Concern  . Not on file   Social History Narrative  . No narrative on file    ROS:  General: no anorexia, weight gain or weight loss Cardiac: no chest pain, dyspnea, orthopnea, PND,  or syncope Respiratory: chronic cough with sputum production; no hemoptysis GI: no nausea, abdominal pain, emesis, diarrhea or constipation Integument: no significant lesions Neurologic: No muscle weakness or paralysis; no speech disturbance; no headache    PHYSICAL EXAM: BP 104/69  Pulse 84  Ht 5\' 11"  (1.803 m)  Wt 143 lb (64.864 kg)  BMI  19.94 kg/m2  SpO2 92%. General-Well-developed; no acute distress; thin body habitus HEENT-[place]/AT; PERRL; EOM intact; conjunctiva and lids nl Neck-No JVD; no carotid bruits Endocrine-No thyromegaly Lungs-No tachypnea, clear without rales or wheezes; few rhonchi; decreased breath sounds with prolonged expiratory phase Cardiovascular- normal PMI; normal S1 and S2; fourth heart sound present; no murmur Abdomen-BS normal; soft and non-tender without masses or organomegaly; midline incision that appeals to have healed by secondary intent Musculoskeletal-No deformities, cyanosis or clubbing Neurologic-Nl cranial nerves; symmetric strength and tone Skin- Warm, no sig. Lesions Extremities-Nl distal pulses; 1+ edema   EKG:  Normal sinus rhythm; first degree AV block; IVCD; leftward axis; prior anteroseptal myocardial infarction; ST-T  wave abnormality consistent with anterolateral ischemia or LVH; no previous tracing for comparison.    ASSESSMENT AND PLAN:

## 2011-01-14 NOTE — Assessment & Plan Note (Signed)
Blood pressure control is excellent; current medications will be continued. 

## 2011-01-15 ENCOUNTER — Encounter: Payer: Self-pay | Admitting: *Deleted

## 2011-01-18 ENCOUNTER — Telehealth: Payer: Self-pay | Admitting: Cardiology

## 2011-01-18 NOTE — Telephone Encounter (Signed)
PT FEET HAVE BEEN SWOLLEN FOR ABOUT THREE DAYS NOW AND ARE GETTING WORSE.

## 2011-01-19 LAB — URINALYSIS, ROUTINE W REFLEX MICROSCOPIC
Ketones, ur: 15 mg/dL — AB
Nitrite: NEGATIVE
Specific Gravity, Urine: 1.01 (ref 1.005–1.030)
pH: 7.5 (ref 5.0–8.0)

## 2011-01-19 LAB — COMPREHENSIVE METABOLIC PANEL
ALT: 39 U/L (ref 0–53)
Albumin: 3.8 g/dL (ref 3.5–5.2)
Alkaline Phosphatase: 95 U/L (ref 39–117)
Glucose, Bld: 113 mg/dL — ABNORMAL HIGH (ref 70–99)
Potassium: 4.4 mEq/L (ref 3.5–5.1)
Sodium: 138 mEq/L (ref 135–145)
Total Protein: 7.3 g/dL (ref 6.0–8.3)

## 2011-01-19 LAB — CBC
Hemoglobin: 16.3 g/dL (ref 13.0–17.0)
RDW: 12.9 % (ref 11.5–15.5)
WBC: 7.8 10*3/uL (ref 4.0–10.5)

## 2011-01-19 LAB — DIFFERENTIAL
Basophils Relative: 0 % (ref 0–1)
Eosinophils Absolute: 0 10*3/uL (ref 0.0–0.7)
Monocytes Absolute: 0.6 10*3/uL (ref 0.1–1.0)
Monocytes Relative: 8 % (ref 3–12)
Neutrophils Relative %: 86 % — ABNORMAL HIGH (ref 43–77)

## 2011-01-21 ENCOUNTER — Telehealth: Payer: Self-pay | Admitting: *Deleted

## 2011-01-21 ENCOUNTER — Encounter: Payer: Self-pay | Admitting: Adult Health

## 2011-01-25 ENCOUNTER — Telehealth: Payer: Self-pay | Admitting: *Deleted

## 2011-01-25 ENCOUNTER — Ambulatory Visit: Payer: Self-pay | Admitting: Adult Health

## 2011-01-25 NOTE — Telephone Encounter (Signed)
Pt was seen by Dr. Minta Balsam (sp?) from [facility], labs and compression stockings were given

## 2011-01-27 ENCOUNTER — Telehealth: Payer: Self-pay | Admitting: Pulmonary Disease

## 2011-01-27 MED ORDER — GUAIFENESIN-CODEINE 100-10 MG/5ML PO SYRP: 2.5000 mL | ORAL_SOLUTION | Freq: Every day | ORAL | Status: DC

## 2011-01-27 NOTE — Telephone Encounter (Signed)
Pt states his cough is the same, no worse and is requesting a refill on cheratussin to take at bedtime. Pt last saw TP and will forward msg to her recs or refill auhtorization.

## 2011-01-27 NOTE — Telephone Encounter (Signed)
Okay for 1 refill If not improving will need ov.  Please contact office for sooner follow up if symptoms do not improve or worsen or seek emergency care

## 2011-01-27 NOTE — Telephone Encounter (Signed)
Pt aware of refill and sent to [facility] in [place]. Pt is scheduled for f/u with RA on 02/08/2011 but will call for sooner appt if needed.

## 2011-01-29 ENCOUNTER — Encounter: Payer: Self-pay | Admitting: Cardiology

## 2011-02-08 ENCOUNTER — Ambulatory Visit (INDEPENDENT_AMBULATORY_CARE_PROVIDER_SITE_OTHER)
Admission: RE | Admit: 2011-02-08 | Discharge: 2011-02-08 | Disposition: A | Discharge status: Home / Self Care | Payer: Medicare Other | Source: Ambulatory Visit | Attending: Pulmonary Disease | Admitting: Pulmonary Disease

## 2011-02-08 ENCOUNTER — Encounter: Payer: Self-pay | Admitting: Pulmonary Disease

## 2011-02-08 ENCOUNTER — Encounter: Payer: Self-pay | Admitting: Internal Medicine

## 2011-02-08 ENCOUNTER — Ambulatory Visit (INDEPENDENT_AMBULATORY_CARE_PROVIDER_SITE_OTHER): Payer: Medicare Other | Admitting: Pulmonary Disease

## 2011-02-08 VITALS — BP 110/60 | HR 85 | Temp 97.4°F | Ht 71.0 in | Wt 143.0 lb

## 2011-02-08 DIAGNOSIS — R05 Cough: Secondary | ICD-10-CM

## 2011-02-08 DIAGNOSIS — J4489 Other specified chronic obstructive pulmonary disease: Secondary | ICD-10-CM

## 2011-02-08 DIAGNOSIS — J449 Chronic obstructive pulmonary disease, unspecified: Secondary | ICD-10-CM

## 2011-02-08 MED ORDER — TIOTROPIUM BROMIDE MONOHYDRATE 18 MCG IN CAPS: 18.0000 ug | ORAL_CAPSULE | Freq: Every day | RESPIRATORY_TRACT | Status: DC

## 2011-02-08 NOTE — Progress Notes (Signed)
  Subjective:    Patient ID: [name], male    DOB: [birth year], 75 y.o.   MRN: [id number]  HPI  PMD - [name] 74/M, ex- smokerfor FU of severe COPD on nocturnal O2 & cough.  5/10 PFTs >> severe obstruction, FEV1 39%, no BD response  He developed a chronic cough.since his heart surgery by [name] in 6/09. Cough drops & cough syrups only give temporary relief. Albuterol nebs & MDI are not very helpful. Did not feel that symbicort helped Barrett's esophagus 3 cm noted to be stable on recent EGD by [name].  H/o sinus surgery, tonsilectomy  CXR 8/09 bibasal atelectasis with pleural calcification rt diaphragm, severe COPD.  CT chest 4/09 severe emphysema, scattered sub-cm nodules stable since 2005   Mar 11, 2010  EGD uneventful. Desaturates on exertion & during sleep - started on oxygen. Using wife's albuterol. BP low on diuretics & metoprolol   November 20, 2010  Seen 1/30 for an acute office visit. Pt c/o DOE, productive cough with gray mucus, wheezing, T-102, chest tightness x 3 days. Upon arrival pt O2 83%RA and recovered to 93% RA after rest P-101. Says that he is coughing up thick mucus mixed with yellow. Some intermittent wheezing. Given avelox & pred  BIBEMS due to cough syncope - admitted to tele but transferred to 2900 for another syncopal event  CT chest neg PE, stable nodules, small RLL infx Rxed for HCAP with cipro & zosyn  Modified Ba swallow nml    02/08/2011 Renal managing potassium now O2 satn 86% on walking to the office from waiting room, improved to 90% on O2. Breathing better, dry cough + CXR -improved infiltrate. He admits to not taking his inhalers as recommended. ONO shows low O2 level for > 1 hr during sleep, stay on O2 Now on regimen of spiriva & albuterol       Review of Systems  Denies chest pain, orthopnea, hemoptysis, fever, n/v/d, edema, headache.  Pt denies any significant  nasal congestion or excess secretions, fever, chills, sweats, unintended wt  loss, pleuritic or exertional cp, orthopnea pnd or leg swelling.  Pt also denies any obvious fluctuation in symptoms with weather or environmental change or other alleviating or aggravating factors.    Pt denies any increase in rescue therapy over baseline, denies waking up needing it or having early am exacerbations or coughing/wheezing/ or dyspnea       Objective:   Physical Exam    Gen. Pleasant, well-nourished, in no distress on O2 ENT - no lesions, no post nasal drip Neck: No JVD, no thyromegaly, no carotid bruits Lungs: no use of accessory muscles, no dullness to percussion, clear without rales or rhonchi  Cardiovascular: Rhythm regular, heart sounds  normal, no murmurs or gallops, no peripheral edema Musculoskeletal: No deformities, no cyanosis or clubbing      Assessment & Plan:

## 2011-02-08 NOTE — Patient Instructions (Signed)
Chest xray today Spiriva Rx sent FU in 4 months

## 2011-02-09 ENCOUNTER — Telehealth: Payer: Self-pay | Admitting: Pulmonary Disease

## 2011-02-09 NOTE — Telephone Encounter (Signed)
Pt aware that per Dr. Vassie Loll, CXR from 02/08/2011 showed that pna markings have improved but are not completely gone. He recs OV in 4 months w/ CXR. Pt had verbalized understanding of this.

## 2011-02-09 NOTE — Assessment & Plan Note (Signed)
Ct spiriva & albuterol Can add pulmicort in future if worsening COnsider roflimulast if frequent flares

## 2011-02-09 NOTE — Assessment & Plan Note (Signed)
Stay on mucinex & benzonatate prn

## 2011-02-10 ENCOUNTER — Encounter: Payer: Self-pay | Admitting: Pulmonary Disease

## 2011-02-11 ENCOUNTER — Encounter: Payer: Self-pay | Admitting: Internal Medicine

## 2011-02-12 ENCOUNTER — Ambulatory Visit: Payer: Medicare Other | Admitting: Internal Medicine

## 2011-03-02 ENCOUNTER — Telehealth: Payer: Self-pay | Admitting: Pulmonary Disease

## 2011-03-02 NOTE — Assessment & Plan Note (Signed)
OFFICE VISIT   JOANN, KULPA  DOB:  03-09-1936                                        May 06, 2008  CHART #:  [id number]   The patient is status post coronary artery bypass grafting x3 done by  Dr. Cornelius Moras on 04/12/2008.  The patient's postoperative course was pretty  much unremarkable.  He went home postop day #4, 04/16/2008.  He presents  today for his 3-week follow up visit.  The patient does state that he is  having productive cough bringing up yellow greenish sputum.  He denies  any fevers, increasing shortness of breath, nausea, vomiting.  The  patient still remains on 2-3 L nasal cannula.  Nurse has been checking  his oxygen level at home.  The patient still complains of some sternal  incisional pain which he is still taking the oxycodone for.  He takes 1-  2 tablets a day.  He has an appointment to see Dr. Domingo Sep this coming  Wednesday, 05/08/2008.  Cardiac rehab has contacted him and he plans to  proceed with orientation this Thursday, 05/09/2008.  The patient is  ambulating 3 or 4 times per day using a cane.  He feels that his energy  level is improving.  His appetite remains poor.  He states he eats what  he can.  He denies any drainage or opening from any of his incision  site.   PHYSICAL EXAMINATION:  VITAL SIGNS:  Blood pressure 107/69, pulse of 82,  respirations of 20, O2 sats are 100% on 3 L checked on room air, 15  minutes off of oxygen sats were 94-95% on room air.  RESPIRATORY:  Diminished bases bilaterally.  CARDIAC:  Regular rate and rhythm.  Sternum is stable.  ABDOMEN:  Benign.  EXTREMITIES:  No significant peripheral edema noted.  INCISIONS:  All incisions are clean, dry and intact and healing well.  Remaining distal sternal sutures were removed..   STUDIES:  The patient had a chest x-ray done today, 05/06/2008, which  shows questionable right lower lobe pneumonia.   IMPRESSION AND PLAN:  The patient is status post coronary  artery bypass  grafting.  It is about 3 weeks now.  The patient is progressing well.  Due to the patient's symptoms of productive cough with questionable  right lower lobe pneumonia, he was placed on a Z-Pak as well as  prescription for guaifenesin.  He was also given a prescription for 40  of oxycodone for pain.  The patient is told to continue no heavy lifting  over 10 pounds for another month.  He is told to wait till he starts  cardiac rehab and energy level improves to without having to use a cane  before driving.  He is to keep appointments with Dr. Domingo Sep for  Wednesday of this week and he is encouraged to go ahead and start  cardiac rehab on Thursday.  The patient is told if he develops any  fevers, nausea, vomiting, opening of drainage from any of his incision  sites, he is to contact us.  We will plan to see the patient back in 3  weeks to follow up with Dr. Cornelius Moras with chest x-ray.  The patient is in  agreement.   Salvatore Decent. Cornelius Moras, M.D.  Electronically Signed   KMD/MEDQ  D:  05/06/2008  T:  05/07/2008  Job:  [id number]   cc:   Salvatore Decent. Cornelius Moras, M.D.  Dani Gobble, MD

## 2011-03-02 NOTE — Telephone Encounter (Signed)
Spoke with pt.  He states that his cough is no better at all since last seen. Still coughing up clear, thick sputum. He also states that his breathing has improved only slightly. OV with [facility] sched for 3:45 tomorrow, advised ED sooner if needed.

## 2011-03-02 NOTE — H&P (Signed)
[name], [name]            ACCOUNT NO.:  [id number]   MEDICAL RECORD NO.:  [id number]          PATIENT TYPE:  AMB   LOCATION:  DAY                           FACILITY:  [facility]   PHYSICIAN:  R. Roetta Sessions, M.D. DATE OF BIRTH:  [birth year]   DATE OF ADMISSION:  DATE OF DISCHARGE:  LH                              HISTORY & PHYSICAL   REASON FOR VISIT:  History of Barrett esophagus, time for EGD.   PHYSICIAN COSIGNING NOTE:  RRoetta Sessions, MD   HISTORY OF PRESENT ILLNESS:  [name] is a pleasant 75 year old  [name] with history of Barrett esophagus with dysplasia who presents  for 1-year followup surveillance exam.  Since we last saw him, he  underwent a 3-vessel coronary artery bypass in June 2009.  He states he  has had some issues with hyperkalemia and is being followed by Dr.  [name].  He is not able to tell me whether or not his creatinine has  been elevated.  With regards to his reflux, he really never has had any  problems with heartburn.  He remains on omeprazole 20 mg daily.  He has  had some vague only occasional problems swallowing rough food.  Denies  any nausea, vomiting, abdominal pain, diarrhea, melena, or rectal  bleeding.  He does have chronic constipation and takes milk of magnesia  2-3 times per week with good results.  He complains of a chronic cough  ever since his bypass surgery.  He states he has had multiple chest x-  rays.  The last one we see in the system is from January 2010 and it  showed severe COPD with right basilar scarring, bibasilar atelectasis  with minimal pleural calcification of the right diaphragm.  He does have  some dyspnea on exertion as well.  He still walks on treadmill daily.   CURRENT MEDICATIONS:  1. Mucinex b.i.d.  2. Simvastatin 80 mg at bedtime.  3. Metoprolol 25 mg b.i.d.  4. Amitriptyline 100 mg at bedtime.  5. Lasix 20 mg daily.  6. Omeprazole 20 mg daily.  7. Aspirin 325 mg daily.  8. Fish oil daily.  9.  Flax seed daily.  10.Albuterol nebulizers once daily.  11.Milk of magnesia q.i.d. or twice a week.  12.SPS 15 g/60 mL, 480 mL weekly.   ALLERGIES:  No known drug allergies.   PAST MEDICAL HISTORY:  Hyperkalemia, hypertension, hypercholesterolemia,  GERD, Barrett esophagus with March 2009, 3-cm segment of Barrett  esophagus noted.  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  Colonoscopy January 2007,  hyperplastic polyp in the sigmoid diverticulosis due for a repeat  colonoscopy 2017, history of CAD status post 3-vessel coronary artery  bypass graft in June 2009, history of surgery due to stomach ulcers in  1999.  He has had sinus surgery, tonsillectomy, and COPD, history of H.  pylori infection treated, history of postoperative wound dehiscence  after his peptic ulcer disease surgery.   FAMILY HISTORY:  Mother deceased at age 15, history of heart disease and  stroke.  Father deceased in his 66s.  Unsure of his medical history as  well as many of his brothers deceased in their 20s, unknown of their  cause of death as well.  No family history known for colon cancer.   SOCIAL HISTORY:  He is married, retired from [employer],  nonsmoker.  Occasional beer use.  Quit smoking in 1999.   REVIEW OF SYSTEMS:  See HPI for GI.  CONSTITUTIONAL.  He complains of  less than 10 pounds weight loss.  CARDIOPULMONARY:  Dyspnea on exertion,  chronic cough nonproductive.  GU:  Denies dysuria or hematuria.   PHYSICAL EXAMINATION:  VITAL SIGNS:  Weight 147.5, height 5 feet 9, temp  97.9, blood pressure 102/70, pulse 64.  GENERAL:  Pleasant, thin Caucasian [name] in no acute distress.  He  coughed several times during the visit which had a little bit of a wet  tone to it.  SKIN:  Warm and dry.  No jaundice.  HEENT: Sclerae are nonicteric.  Oropharyngeal mucosa moist and pink.  CHEST:  Lungs are clear to auscultation.  Breath sounds were  distant  bilaterally.  CARDIAC:  Distant heart sounds, regular rate and rhythm.  No murmurs.  ABDOMEN:  Positive bowel sounds.  Her abdomen is soft.  He has a well-  healed surgical scar with a small herniation just left of the midline in  the upper abdomen as well as just right of midline around the umbilicus.  No rebound or guarding.  No abdominal masses or bruits.  No  splenomegaly.  Hernia is easily reducible, nontender.  LOWER EXTREMITIES:  No edema.   LABORATORY DATA:  The patient states his sodium was 137 and potassium  4.3 today.  On May 21, 2008, his creatinine was 0.7, but I do not have  any more recent labs.  He had a MR abdomen without contrast which showed  a left renal and left hepatic cyst.   IMPRESSION:  [name] with history of  Barrett esophagus with low-grade dysplasia.  She was due for a 1-year  followup EGD.  From a GI standpoint, he is fairly asymptomatic outside  of chronic constipation which is well treated with milk of magnesia.  He  has a history of remote peptic ulcer disease requiring surgery.  With  regards to his chronic cough, this is unlikely related to  laryngopharyngeal reflux.  Likely related to COPD.   PLAN:  1. EGD in the near future with Dr. Jena Gauss.  2. We will hold his aspirin for 3 days prior to procedure.  3. He is currently on omeprazole 20 mg daily; however, Dr. Jena Gauss may      recommend increasing his dose to 40 mg daily or total as he      previously had stated last year.  It is unclear why the patient is      not on the previously recommended dose at this time.  Further      recommendations to follow.      [name], P.AJonathon Bellows, M.D.  Electronically Signed    LL/MEDQ  D:  12/23/2008  T:  12/24/2008  Job:  [id number]   cc:   [facility]

## 2011-03-02 NOTE — Consult Note (Signed)
[name], [name]            ACCOUNT NO.:  [id number]   MEDICAL RECORD NO.:  [id number]          PATIENT TYPE:  INP   LOCATION:  2014                         FACILITY:  [facility]   PHYSICIAN:  Salvatore Decent. Cornelius Moras, M.D. DATE OF BIRTH:  1936-04-05   DATE OF CONSULTATION:  DATE OF DISCHARGE:  04/10/2008                                 CONSULTATION   PRIMARY CARE PHYSICIAN:  Patrica Duel, M.D.   REASON FOR CONSULTATION:  Left main disease.   HISTORY OF PRESENT ILLNESS:  [name] is a 75 year old retired  gentleman from [place] with no previous history of coronary artery  disease, but risk factors notable for a history of hypertension,  hyperlipidemia, remote history of tobacco use.  The patient has  underlying history of chronic obstructive pulmonary disease as well as  Barrett esophagus, GE reflux disease, and remote history of peptic ulcer  disease.  The patient presents with symptoms of exertional shortness of  breath that have been slowly progressive over the last several years.  He has never had any chest pain.  He denies any resting shortness of  breath, PND, orthopnea, or lower extremity edema.  He quit smoking 10  years ago.  He underwent an echocardiogram that revealed baseline mild  left ventricular dysfunction, but no significant valvular heart disease.  He subsequently underwent an exercise treadmill nuclear scan that was  abnormal.  This prompted cardiac catheterization, which was performed  today by [name].  The patient was found to have 95% stenosis of the  distal left main coronary artery with 100% occlusion of the right  coronary artery.  Left ventricular ejection fraction is estimated 45%.  The patient was admitted to the hospital and a cardiothoracic surgery  consultation was requested.   REVIEW OF SYSTEMS:  GENERAL:  The patient reports normal appetite.  He  has not been gaining or losing weight recently.  He does report  progressive exertional  fatigue.  CARDIAC:  The patient denies any  symptoms of chest pain.  The patient has mild exertional shortness of  breath.  This occurs with relatively strenuous activity.  The patient  denies resting shortness of breath, PND, orthopnea, or lower extremity  edema.  The patient has occasional dizzy spells.  He has not had  syncopal episodes.  RESPIRATORY:  Notable only for exertional shortness  of breath.  The patient denies productive cough, hemoptysis, or  wheezing.  GASTROINTESTINAL:  Notable for history of Barrett esophagus  for which he is followed carefully and undergoes annual biopsies.  He  currently has no symptoms of dysphagia.  He denies hematochezia,  hematemesis, or melena.  He does have some problems with constipation.  MUSCULOSKELETAL:  Negative.  NEUROLOGIC:  Negative.  HEENT:  Negative.  INFECTIOUS:  Negative.  GENITOURINARY:  Negative.   PAST MEDICAL HISTORY:  1. Hypertension.  2. Hyperlipidemia.  3. Chronic obstructive pulmonary disease  4. Remote tobacco abuse.  5. Barrett esophagus.  6. Peptic ulcer disease.   PAST SURGICAL HISTORY:  1. Exploratory laparotomy for patch repair of perforated peptic ulcer      in 1999.  2.  Tonsillectomy.  3. Sinus surgery.   FAMILY HISTORY:  Noncontributory.   SOCIAL HISTORY:  The patient is retired and married and lives with his  wife in [place].  He has had to slow down physically due to his  symptoms of exertional shortness of breath.  The patient has a previous  history of 1 pack per day tobacco abuse, although he quit smoking in  1999.  The patient denies excessive alcohol consumption.   MEDICATIONS PRIOR TO ADMISSION:  1. Multivitamin.  2. Fish oil.  3. Amlodipine 5 mg daily.  4. Hydrochlorothiazide 25 mg daily.  5. Simvastatin 20 mg daily.  6. Prilosec 20 mg daily.  7. Elavil 100 mg daily.  8. Cysteine eye drops to both eyes daily.  9. Lubricant eye ointment to both eyes at bedtime.   DRUG ALLERGIES:  None  known.   PHYSICAL EXAM:  GENERAL:  The patient is a thin white male who appears  his stated age in no acute distress.  HEENT:  Exam is unrevealing.  NECK:  Supple.  There are no carotid bruits.  There is no  lymphadenopathy.  CHEST:  Auscultation of the chest reveals distant breath sounds.  No  wheezes or rhonchi noted.  CARDIOVASCULAR:  Notable for distant heart sounds.  No murmurs, rubs, or  gallops are appreciated.  ABDOMEN:  Soft, nondistended, and nontender.  Bowel sounds are present.  There are no palpable masses.  EXTREMITIES:  Warm and adequately perfused.  There is no lower extremity  edema.  Femoral pulses are palpable.  Distal pulses are not thready.  There is no lower extremity edema.  There is no venous insufficiency.  RECTAL/GU:  Exams both deferred.  SKIN:  Appears clean, dry, and pink throughout.   DIAGNOSTIC TEST:  Cardiac catheterization performed by [name] is  reviewed.  This demonstrates 95% stenosis of the distal left main  coronary artery.  There is 70% stenosis of mid-left anterior descending  coronary artery.  There is 70% stenosis of left circumflex coronary  artery.  There is a 100% proximal occlusion of the right coronary artery  with right to right bridging collaterals and left to right collaterals  to the distal right coronary artery and its branches.  Left ventricular  function is mild to moderately reduced with ejection fraction estimated  45%.  There is some inferior wall hypokinesis.  Chest CT scan from April  reveals severe centrilobular emphysema with large bullous changes in  both lungs and a giant bullae in the right lung.  There are no lesions  suspicious for malignancy.   IMPRESSION:  Severe left main disease with 3-vessel coronary artery  disease and mild-to-moderate left ventricular dysfunction.  [name] presents with symptoms of exertional shortness of breath.  He  has no chest pain.  His symptoms are quite stable.  I believe he  would  best be treated with coronary artery bypass grafting.  The patient also  has history of chronic obstructive pulmonary disease and recent chest CT  scan that showed severe central lobular emphysema.  He has been  evaluated by Dr. Levy Pupa from the pulmonary critical care team and  is scheduled to undergo pulmonary function testing.  I am doubtful that  this chronic lung disease will be severe enough that would preclude  surgical management of his underlying coronary artery disease and we  will tentatively plan to proceed with surgery on Friday, April 12, 2008.  His emphysema is severe enough that he  will be at increased risk for  post-operative respiratory failure, pneumonia, atrial fibrillation,  pneumothorax, prolonged air leak.  I suspect that his internal mammary  artery may not be long enough to reach the heart for grafting in-situ.   PLAN:  I have outlined options at length with Mr. Kuhner and his  family.  Alternative treatment strategies have been discussed.  They  understand and accept all associated risks of surgery including, but not  limited to risk of death, stroke, myocardial infarction, congestive  heart failure, respiratory failure, pneumonia, bleeding requiring blood  transfusion, arrhythmia, infection, and recurrent coronary artery  disease.  All their questions have been addressed.      Salvatore Decent. Cornelius Moras, M.D.  Electronically Signed     CHO/MEDQ  D:  04/10/2008  T:  04/11/2008  Job:  [id number]

## 2011-03-02 NOTE — Op Note (Signed)
NAMESTEPHANOS, [name]            ACCOUNT NO.:  [id number]   MEDICAL RECORD NO.:  [id number]          PATIENT TYPE:  AMB   LOCATION:  DAY                           FACILITY:  [facility]   PHYSICIAN:  R. Roetta Sessions, M.D. DATE OF BIRTH:  1935-12-27   DATE OF PROCEDURE:  09/04/2007  DATE OF DISCHARGE:                               OPERATIVE REPORT   PROCEDURE:  EGD with biopsy.   INDICATIONS FOR PROCEDURE:  A 75 year old gentleman with history of  short segment Barrett's, last EGD and biopsy a couple of years ago.  Reflux symptoms are well-controlled on Omeprazole.  She does not have  any evidence of GI bleeding or dysphagia.  EGD is now being done for  surveillance purposes.  This approach has been discussed with the  patient at length.  Potential risks, benefits, and alternatives have  been reviewed.  Questions answered and agreeable.  Please see  documentation in medical record.   DESCRIPTION OF PROCEDURE:  O2 saturation, blood pressure, pulse, and  respirations were monitored throughout the entire procedure.  Conscious  sedation was Versed 2 mg IV, Demerol 50 mg IV in a single dose, and  Cetacaine spray for topical pharyngeal anesthesia.  Instrument was  Pentax video chip system.   FINDINGS:  EGD examination; tubular esophagus with a 3-4 cm tongue of  salmon colored epithelium just inside the tongue on the salmon-colored  side, there is a 5 mm nodule.  It was subtle but present.  EG junction  easily traversed.   Stomach:  Gastric cavity was emptied and insufflated well with air.  Thorough examination of gastric mucosa including retroflexed view of the  proximal stomach and esophagogastric junction demonstrated a small  hiatal hernia and surgical deformity of the antrum prepyloric area  consistent with his prior history of peptic ulcer with perforation  requiring laparotomy previously.  Pylorus was patent and easily  traversed.  Examination of the bulb and second portion revealed  no  abnormalities.   THERAPEUTIC/DIAGNOSTIC MANEUVERS PERFORMED:  1. The nodule in the Barrett's was biopsied separately and partially      denuded.  2. The Barrett's mucosa elsewhere was biopsied for surveillance      purposes.  The patient tolerated the procedure well and was      reactivated in endoscopy.   IMPRESSION:  1. Short segment salmon-colored epithelium with superimposed nodule      consistent with prior history of short segment Barrett's, status      post biopsies as described above.  Otherwise normal esophagus.  2. Small hiatal hernia deformity of the antrum, prepyloric mucosa      consistent with prior history of peptic ulcer disease with      perforation requiring laparotomy, otherwise normal residual gastric      mucosa, normal D1 and D2.   RECOMMENDATIONS:  1. Follow up on pathology.  2. Continue Omeprazole 20 mg orally daily.  3. Further recommendations to follow.      [name], M.D.  Electronically Signed     RMR/MEDQ  D:  09/04/2007  T:  09/04/2007  Job:  [id number]

## 2011-03-02 NOTE — Op Note (Signed)
NAMEJUSTO, [name]            ACCOUNT NO.:  [id number]   MEDICAL RECORD NO.:  [id number]          PATIENT TYPE:  INP   LOCATION:  2308                         FACILITY:  [facility]   PHYSICIAN:  Bedelia Person, M.D.        DATE OF BIRTH:  June 21, 1936   DATE OF PROCEDURE:  04/12/2008  DATE OF DISCHARGE:                               OPERATIVE REPORT   PROCEDURE:  Intraoperative transesophageal echocardiography.   [name] is an elderly male with known coronary artery disease.  TEE will be used intraoperatively to assess left ventricular function as  well as valvular dysfunction during his coronary artery bypass  procedure.  After induction of general anesthesia, the airway was  secured with an oral endotracheal tube.  Gastric contents were suctioned  with an orogastric tube which was then removed.  The TEE transducer was  heavily lubricated, placed in a sleeve which was then blindly passed  down the oropharynx with no significant resistance.  The probe remained  in the neutral unflexed position during the bypass period and was  removed at the completion of the procedure without any evidence oral or  pharyngeal damage.  The prebypass examination revealed the left  ventricle showed no segmental defects.  There was global hypokinesis.  The overall ejection fraction was estimated at 45-55%.  The left atrium  was normal size.  The appendage was clean.  The atrial septum was  intact.  Mitral valve had thin freely mobile leaflets coapting in the  valvular plane.  Color Doppler revealed 1+ central regurgitant flow in a  very narrow regurgitant jet.  Aortic valve had 3 leaflets opened and  closed appropriately.  No calcification detected.  There was no aortic  insufficiency.  Tricuspid valve normal appearance and function.  Theone Murdoch catheter was noted.  There was mild tricuspid insufficiency.  The  patient was placed on cardiopulmonary bypass and underwent coronary  artery bypass grafting.   At the completion of the procedure, there was  initially significant hypokinesis of the inferior and septal walls.  Inotropic dopamine was initiated and contractility turned to a dynamic  state in all segments.  After a short time, there was a significant  dynamic contractility of the entire left ventricle.  The mitral valve  initially showed a significant increase in regurgitant flow 2-3+, this  quickly resolved with increasing contractility of the left ventricle and  returned to the prebypass state of 1+ regurgitant flow and again a well-  defined central regurgitant jet.  The aortic valve as well as the right  side of the heart was unchanged from the prebypass examination.  There  were no other significant findings in the postbypass period.           ______________________________  Bedelia Person, M.D.     LK/MEDQ  D:  04/12/2008  T:  04/13/2008  Job:  [id number]

## 2011-03-02 NOTE — Op Note (Signed)
[name], [name]            ACCOUNT NO.:  [id number]   MEDICAL RECORD NO.:  [id number]          PATIENT TYPE:  AMB   LOCATION:  DAY                           FACILITY:  [facility]   PHYSICIAN:  R. Roetta Sessions, M.D. DATE OF BIRTH:  Dec 25, 1935   DATE OF PROCEDURE:  12/22/2007  DATE OF DISCHARGE:                               OPERATIVE REPORT   PROCEDURE:  EGD with biopsy.   INDICATIONS FOR PROCEDURE:  A 75 year old gentleman with history  complicated with peptic ulcer disease requiring surgery for a duodenal  perf previously who also has Barrett's esophagus.  EGD back in November  2008 demonstrated a small nodule associated with Barrett's, low-grade  dysplasia, no high-grade dysplasia, no carcinoma.  He is not having any  dysphagia, melena or reflux symptoms on omeprazole 20 grams orally  b.i.d.  We are now doing followup EGD with biopsies.  The procedure has  been discussed with the patient at length.  Potential risks, benefits  and alternatives have been reviewed and questions answered.  Please see  the doctor dictation in the medical record.   PROCEDURE IN DETAIL:  O2 saturation, blood pressure, pulse, respiration  were monitored throughout entire procedure.  Conscious sedation with  Versed 2 mg IV, Demerol 50 mg IV in a single dose.  Cetacaine spray for  topical pharyngeal anesthesia.   INSTRUMENT:  Pentax video chip system.   FINDINGS:  EGD.  Examination of tubular esophagus revealed approximately  3 cm change in color coming up distal esophagus of the mucosa.  Salmon  colored epithelium was extended up.  There was a tiny island of salmon  colored epithelium just above the confluent salmon colored epithelium.  There was no evidence of neoplasm, nodular or other abnormality.  There  was no esophagitis.  EG junction was easily traversed down to the  stomach.  The stomach, colon, gastric cavity was empty and insufflated  well with air.  Thorough examination of gastric mucosa  including  retroflexion of the proximal stomach and esophagogastric junction  demonstrated a small hiatal hernia and postsurgical changes of the  antrum, pyloric channel, duodenum, otherwise the gastric mucosa appeared  normal.  The second portion of the duodenum appeared normal.  Therapeutic/diagnostic maneuvers performed.  Biopsies of proximal and  mid distal salmon colored epithelium were taken for the pathologist,  also the tiny island was biopsied as well.  The patient tolerated the  procedure well and was reactive after compression.  Abnormal distal  esophageal mucosa consistent with prior diagnosis of Barrett's  esophagus.  No evidence of neoplasm, status post segmental biopsy,  hiatal hernia.  Postsurgical changes antrum/pylorus/duodenum, otherwise  normal residual stomach, normal D2.   RECOMMENDATION:  1. Continue omeprazole 20 mg orally twice daily.  2. Follow up on path.  3. Further recommendations to follow.      [name], M.D.  Electronically Signed     RMR/MEDQ  D:  12/22/2007  T:  12/23/2007  Job:  [id number]   cc:   Ramon Dredge L. Juanetta Gosling, M.D.  Fax: [phone]

## 2011-03-02 NOTE — Discharge Summary (Signed)
[name], [name]            ACCOUNT NO.:  [id number]   MEDICAL RECORD NO.:  [id number]          PATIENT TYPE:  INP   LOCATION:  2011                         FACILITY:  [facility]   PHYSICIAN:  [name], M.D. DATE OF BIRTH:  04/02/1936   DATE OF ADMISSION:  04/10/2008  DATE OF DISCHARGE:  04/16/2008                               DISCHARGE SUMMARY   ADMITTING DIAGNOSES:  1. Multivessel coronary artery disease.  2. History of hypertension.  3. History of hyperlipidemia.  4. History of chronic obstructive pulmonary disease.  5. History of peptic ulcer disease.   DISCHARGE DIAGNOSES:  1. Multivessel coronary artery disease.  2. History of hypertension.  3. History of hyperlipidemia.  4. History of chronic obstructive pulmonary disease.  5. History of peptic ulcer disease.   PROCEDURE:  Coronary artery bypass grafting x3 (left internal mammary  artery to left anterior descending, saphenous vein graft to circumflex  marginal branch with coronary endarterectomy, saphenous vein graft to  posterior descending coronary artery with endoscopic vein harvest from  right lower extremity done by Dr. Cornelius [name] on April 12, 2008.   BRIEF HOSPITAL COURSE STAY:  [name] is a 75 year old male  patient of [facility] and Dr. Geanie Logan who had no known  previous history of coronary artery disease, but did have a history of  hypertension, hyperlipidemia, remote history of tobacco use, and COPD.  The patient originally presented with symptoms of exertional shortness  of breath that has progressively worsened.  Echocardiogram revealed mild  baseline left ventricular dysfunction.  Exercise treadmill and nuclear  scan was abnormal.  Cardiac catheterization was performed by Dr. Jacinto Halim  which showed a 95% stenosis of the distal left main coronary artery,  100% occlusion of the right coronary artery, left ventricular ejection  fraction was estimated to be 45%.  The patient underwent  the  aforementioned coronary artery bypass grafting surgery.  It should also  be noted that at the time of surgery, the patient was found to have  severe bullous emphysema.  Postoperatively, the patient remained  hemodynamically stable.  His chest tubes were removed on postop day #1.  Followup x-ray revealed no pneumothorax, chronic COPD changes, and  basilar atelectasis were noted, however.  The patient was volume  overloaded and diuresed accordingly.  The patient continued to improve  such that currently on postop day #3, the patient does have some  complaints of nauseousness and constipation.  He is afebrile.  BP is  99/79, O2 sat 90% on 2 L nasal cannula, heart rate 96, preoperative  weight is 66 kg, today's weight is 68.2 kg.  On physical exam,  cardiovascular, regular rate and rhythm.  Lungs are mostly clear.  Extremities, trace bilateral lower extremity edema, right greater than  left.  It should be noted the patient was found to be slightly  hyponatremic at 129.  We will recheck this in the a.m.  Provided the  patient remains hemodynamically stable, he will be discharged on April 16, 2008.   DISCHARGE LABORATORY STUDIES:  BMET last done today, April 15, 2008,  potassium 4.8, as previously stated sodium  129, BUN and creatinine 12  and 0.79 respectively, this will be checked on April 16, 2008.  Last CBC  also done today with H&H to be 10.1 and 29.4 respectively.  White count  of 10,100, platelet count 184,000.   Last chest x-ray was done as well this morning which showed the lungs  remain hyperaerated consistent with COPD, small bilateral pleural  effusions, and questionable mild CHF.   DISCHARGE INSTRUCTIONS:  No driving or lifting more than 10 pounds.  The  patient is continued to do his deep breathing exercises daily.  He is to  walk everyday and increase for a continued duration as tolerated.  He is  to remain on a low-fat, low-salt diet.  The patient will have epicardial   pacing wires and chest tube sutures removed prior to his discharge.   FOLLOW-UP VISITS:  [facility].  He needs to call for an  appointment for 2 weeks.  He has an appointment in the [facility] office on  May 06, 2008, at 1 p.m.   DISCHARGE MEDICATIONS:  1. Multivitamin p.o. daily.  2. Fish oil 1000 mg p.o. daily.  3. Prilosec 20 mg p.o. daily.  4. Elavil 100 mg p.o. daily, 15 eye drops to both eyes daily.  5. Lumigan eye ointment to both eyes at bedtime.  6. Enteric coated aspirin 225 mg p.o. daily.  7. Zocor 80 mg p.o. nightly.  8. Lopressor 12.5 mg p.o. twice daily.  9. Oxycodone 5 mg 1-2 tablets p.o. q.46 h. p.r.n. for pain.      Doree Fudge, PA      [place] H. Cornelius [name], M.D.  Electronically Signed    DZ/MEDQ  D:  04/15/2008  T:  04/16/2008  Job:  [id number]   cc:   [facility]  Patrica Duel, M.D.

## 2011-03-02 NOTE — Op Note (Signed)
NAMEWASIM, HURLBUT            ACCOUNT NO.:  [id number]   MEDICAL RECORD NO.:  [id number]          PATIENT TYPE:  AMB   LOCATION:  DAY                           FACILITY:  [facility]   PHYSICIAN:  R. Roetta Sessions, M.D. DATE OF BIRTH:  1936/04/27   DATE OF PROCEDURE:  01/06/2009  DATE OF DISCHARGE:                               OPERATIVE REPORT   PROCEDURE PERFORMED:  Esophagogastroduodenoscopy with esophageal biopsy.   INDICATIONS FOR PROCEDURE:  A 75 year old gentleman history of short  segment Barrett's esophagus with a history of low grade dysplasia.  He  is not having any esophageal symptoms such as reflux or dysphagia.  He  is here for surveillance.  Risks, benefits, alternatives  and  limitations have been reviewed, questions answered.  Please see the  documentation in the medical record.   PROCEDURE NOTE:  O2 saturation, blood pressure, pulse and respirations  were monitored throughout the entire procedure.   CONSCIOUS SEDATION:  Versed 3 mg IV, Demerol 50 mg IV in divided doses.   INSTRUMENT USED:  Pentax video chip system.   Cetacaine spray for topical pharyngeal anesthesia.   FINDINGS:  Examination of the tubular esophagus revealed salmon-colored  epithelium coming up approximately 3 cm above the GE junction.  There  was no evidence of esophagitis, stricture, or neoplasm.  EG junction  easily traversed.  Stomach:  Gastric cavity was empty and insufflated well with air.  Thorough examination of gastric mucosa including retroflex view in  proximal stomach, esophagogastric junction demonstrated a small hiatal  hernia and deformity of the antrum, pylorus, consistent with prior  history of surgery for perforated ulcer.  Overlying mucosa appeared  normal.  Pylorus patent, easily traversed.  Examination of the bulb and  second portion revealed no abnormalities.   THERAPEUTIC/DIAGNOSTIC MANEUVERS PERFORMED:  Multiple biopsies of  proximal and distal aspect of the short  segment of Barrett's was taken  for histologic study.  The patient tolerated the procedure well, was  reacted in endoscopy.   IMPRESSION:  A 3 cm segment of Barrett's appearing epithelium unchanged  from prior exam.  Endoscopically, status post multiple segmental  biopsies described above, otherwise unremarkable esophagus, small hiatal  hernia, deformity of pylorus and antrum consistent with history of  surgery for perforated peptic ulcer, otherwise mucosa of the gastric  mucosa appeared normal.  D1 and D2 appeared normal as well.   RECOMMENDATIONS:  1. Follow-up on path.  2. Further recommendations to follow.      [name], M.D.  Electronically Signed     RMR/MEDQ  D:  01/06/2009  T:  01/06/2009  Job:  [id number]   cc:   [facility]

## 2011-03-02 NOTE — Discharge Summary (Signed)
NAMEDELANEY, SCHNICK            ACCOUNT NO.:  [id number]   MEDICAL RECORD NO.:  [id number]          PATIENT TYPE:  INP   LOCATION:  2011                         FACILITY:  [facility]   PHYSICIAN:  Salvatore Decent. Cornelius Moras, M.D. DATE OF BIRTH:  Jun 11, 1936   DATE OF ADMISSION:  04/10/2008  DATE OF DISCHARGE:  04/19/2008                               DISCHARGE SUMMARY   ADDENDUM   DATE OF DISCHARGE:  April 19, 2008.   ADMITTING DIAGNOSES:  Same.   DISCHARGE DIAGNOSES:  Same.  In addition to chronic obstructive  pulmonary disease exacerbation.   BRIEF HOSPITAL COURSE STAY:  Since last dictation, later the evening of  postoperative day #3, the patient had some complaints of increasing  shortness of breath.  He was found to be volume overloaded and was  diuresed accordingly.  He was still requiring several liters of oxygen  via nasal cannula.  He did remain hemodynamically stable and afebrile;  however, he was felt to be developing bronchitis.  Today, he has begun  on Avelox 400 mg p.o. daily.   LATEST LABORATORY STUDIES:  BMET last done April 17, 2008, showed the  sodium to be 130, potassium 3.8, and BUN and creatinine were 7 and 0.72  respectively.  BNP done on this date as well was 466.  ABG done on April 16, 2008, pH was 7.454, PCO2 39.3, PO2 65.9, and bicarb 27.1.  Last CBC  was done on April 15, 2008, showed a white count to be 10,100, H&H to be  10.1 and 29.4 respectively, and platelet count of 184,000.   ADDITIONAL MEDICATIONS UPON DISCHARGE:  1. Lasix 40 mg p.o. daily x7 days.  2. KCl 20 mEq p.o. daily x7 days.  3. Avelox 400 mg p.o. daily x5 days.  4. Combivent inhaler 2 puffs 4 times daily.  5. In addition, the patient will most likely require home O2 and this      will be arranged accordingly prior to discharge.      Doree Fudge, PA      Hamilton H. Cornelius Moras, M.D.  Electronically Signed    DZ/MEDQ  D:  04/18/2008  T:  04/19/2008  Job:  [id number]

## 2011-03-02 NOTE — Op Note (Signed)
[name], [name]            ACCOUNT NO.:  [id number]   MEDICAL RECORD NO.:  [id number]           PATIENT TYPE:   LOCATION:                                 FACILITY:   PHYSICIAN:  Salvatore Decent. Cornelius Moras, M.D. DATE OF BIRTH:  07-22-1936   DATE OF PROCEDURE:  04/12/2008  DATE OF DISCHARGE:                               OPERATIVE REPORT   PREOPERATIVE DIAGNOSES:  Critical left main disease and three-vessel  coronary artery disease.   POSTOPERATIVE DIAGNOSES:  Critical left main disease and three-vessel  coronary artery disease.   PROCEDURE:  Median sternotomy for coronary artery bypass grafting x3  (left internal mammary artery to distal left anterior descending  coronary artery, saphenous vein graft to circumflex marginal branch with  coronary endarterectomy, saphenous vein graft to posterior descending  coronary artery, and endoscopic saphenous vein harvest from right thigh  and right lower leg).   SURGEON:  Salvatore Decent. Cornelius Moras, MD   ASSISTANT:  Coral Ceo, PA   ANESTHESIA:  General.   BRIEF CLINICAL NOTE:  The patient is a 75 year old male with no previous  history of coronary artery disease, but risk factors notable for a  history of hypertension, hyperlipidemia, remote history of tobacco use,  and severe chronic obstructive pulmonary disease.  The patient presents  with symptoms of exertional shortness of breath that had progressed over  the last several years.  Echocardiogram revealed mild baseline left  ventricular dysfunction.  Exercise treadmill nuclear scan was abnormal.  Cardiac catheterization was performed by Dr. Jacinto Halim demonstrating 95%  stenosis of the left main coronary artery with three-vessel coronary  artery disease and mild left ventricular dysfunction.  A full  consultation note has been dictated previously.  Pulmonary function test  confirmed the presence of severe chronic obstructive pulmonary disease.  The patient and his family have been counseled at  length regarding the  indications, risks, and potential benefits of surgery.  Alternative  treatment strategies have been discussed.  They understand and accept  all potential associated risks and desire to proceed as described.   OPERATIVE FINDINGS:  1. Normal left ventricular function.  2. Good-quality left internal mammary artery and saphenous vein      conduit for grafting.  3. Fair-quality left anterior descending coronary artery target vessel      for grafting which was intramyocardial.  4. Extremely poor target vessels for grafting in the left circumflex      and right coronary artery distributions, coronary endarterectomy      necessary for left circumflex grafting.   OPERATIVE NOTE IN DETAIL:  The patient was brought to the operating room  on the above-mentioned date and central monitoring was established by  the Anesthesia Service under the care and direction of Dr. Bedelia Person.  Specifically, a Swan-Ganz catheter was placed through the right internal  jugular approach.  A radial arterial line was placed.  Intravenous  antibiotics were administered.  The patient's chest, abdomen, both  groins, and both lower extremities were prepared and draped in sterile  manner.  Following general endotracheal anesthesia induction, a Foley  catheter was placed.  Baseline transesophageal echocardiogram was performed by Dr. Gypsy Balsam.  This demonstrates mild left ventricular dysfunction.  There was some  inferior wall hypokinesis.   A median sternotomy incision was performed.  There was severe bullous  emphysema with severely hyperinflated lungs.  The left internal mammary  artery was dissected from the chest wall and prepared for bypass  grafting.  The left internal mammary artery was a good-quality conduit.  Simultaneously, saphenous vein was obtained from the patient's right  thigh and the upper portion of the right lower leg using endoscopic vein  harvest technique.  The saphenous vein was  a good-quality conduit.  After the saphenous vein had been removed, the small incision in the  right lower extremity was closed in multiple layers with running  absorbable suture.  The patient was heparinized systemically and the  left internal mammary artery was transected distally.  It was noted to  have excellent flow.   The pericardium was opened.  The ascending aorta was mildly diseased  with atherosclerosis.  There was some hard palpable plaque in the  transverse aortic arch.  The ascending aorta was cannulated  uneventfully.  A venous cannula was placed through the right atrium.  Adequate heparinization was verified.  Cardiopulmonary bypass was begun.  There were some adhesions surrounding the surface of the left ventricle  which were taken down sharply.  Distal target vessels were selected for  coronary bypass grafting.  There was diffuse coronary artery disease  with hard calcified plaque throughout all of the coronary arteries.  The  left anterior descending coronary artery was intramyocardial.  A  temperature probe was placed in the left ventricular septum, and a  cardioplegic catheter was placed in the ascending aorta.   The patient was allowed to cool passively to 32 degrees systemic  temperature.  The aortic cross-clamp was applied, and cold blood  cardioplegia was delivered in antegrade fashion through the aortic root.  Iced saline slush was applied for topical hypothermia.  The initial  cardioplegic arrest and myocardial cooling was felt to be excellent.  Repeat doses of cardioplegia were administered intermittently throughout  the cross-clamp portion of the operation through the aortic root and  down the subsequently placed vein grafts to maintain left ventricular  septal temperature below 15 degrees centigrade.   The following distal coronary anastomoses were performed:  1. Circumflex marginal branch was grafted with a saphenous vein graft      in an end-to-end  fashion to the distal end of the vessel.  This      vessel was diffusely diseased and no appropriate site for distal      grafting can be identified.  Ultimately, the most optimal site      fairly distal in the vessel was opened, but the vessel was severely      diseased and coronary endarterectomy was necessary.  The proximal      end of the vessel was ligated.  The vein graft was subsequently      sewn to the distal end of the vessel in an end-to-end fashion after      coronary endarterectomy.  A 1.5 probe will pass distally.  2. The posterior descending coronary artery was grafted with a      saphenous vein graft in an end-to-side fashion.  This vessel      measures 1.4 mm in diameter and was a poor-quality target vessel      for grafting.  The distal right coronary artery was severely  calcified throughout.  3. The distal left anterior descending coronary artery was grafted      with left internal mammary artery in an end-to-side fashion.  This      vessel was intramyocardial.  It was severely diseased proximally,      but an acceptable site was found to distal grafting and a 2.0 probe      would pass distally.   Both proximal saphenous vein anastomoses were performed directly to the  ascending aorta prior to removal of the aortic cross-clamp.  The left  ventricular septal temperature rises rapidly with reperfusion of the  left internal mammary artery.  The aortic cross-clamp was removed after  a total cross-clamp time of 92 minutes.  The heart began to beat  spontaneously without need for cardioversion.  All proximal and distal  coronary anastomoses were inspected for hemostasis in appropriate graft  orientation.  Epicardial pacing wires fixed to the right ventricular  free wall into the right atrial appendage.  The patient was rewarmed to  37 degrees centigrade temperature.  The patient was weaned from  cardiopulmonary bypass without difficulty.  The patient's rhythm at   separation from bypass was normal sinus rhythm.  The patient was then  weaned from bypass on dopamine at 3 mcg/kg per minute.  Total  cardiopulmonary bypass time for the operation was 114 minutes.  Follow  up transesophageal echocardiogram performed by Dr. Gypsy Balsam after  separation from bypass demonstrates improved left ventricular function  with essentially normal function with very mild basilar hypokinesis.   The venous and arterial cannulae were removed uneventfully.  Protamine  was administered to reverse the anticoagulation.  The mediastinum and  the left pleural space were irrigated with saline solution containing  vancomycin.  Meticulous surgical hemostasis was ascertained.  The  mediastinum and both pleural spaces were drained using 4 chest tubes  exited through separate stab incisions.  The pericardium and soft  tissues anterior to the aorta were reapproximated loosely.  The sternum  was closed with double-strength sternal wire.  The soft tissues anterior  sternum were closed in multiple layers and the skin was closed with a  running subcuticular skin closure with exception of a few interrupted  vertical mattress skin stitches at the inferior aspect of the sternal  incision due to previous scar tissue from the patient's remote  exploratory laparotomy in the past.   The patient tolerated the procedure well and was transported to the  Surgical Intensive Care Unit in stable condition.  There were no  intraoperative complications.  All sponge, instrument, and needle counts  were verified correct at completion of the operation.  No blood products  were administered.      Salvatore Decent. Cornelius Moras, M.D.  Electronically Signed     CHO/MEDQ  D:  04/12/2008  T:  04/12/2008  Job:  [id number]

## 2011-03-02 NOTE — Assessment & Plan Note (Signed)
OFFICE VISIT   [name], [name]  DOB:  Apr 11, 1936                                        May 27, 2008  CHART #:  [id number]   HISTORY OF PRESENT ILLNESS:  The patient returns for further followup  status post coronary artery bypass grafting x3 on April 12, 2008.  He was  last seen here in the office on May 06, 2008.  Since then, he has  continued to do well.  He is no longer using oxygen at home.  He is  getting around quite well and his exercise tolerance has improved  considerably.  He is eager to start the cardiac rehab program, but he is  still waiting for the rehab service to schedule his first visit.  He has  been followed carefully by Dr. Domingo Sep and he otherwise continues to  improve uneventfully.  He has no complaints.   PHYSICAL EXAMINATION:  GENERAL:  A well-appearing male.  VITAL SIGNS:  Blood pressure 130/85, pulse 85, and oxygen saturation 94%  on room air.  CHEST:  A median sternotomy incision that has healed nicely.  The  sternum is stable.  Breath sounds are clear and symmetrical bilaterally.  No wheezes or rhonchi are noted.  ABDOMEN:  Soft and nontender.  EXTREMITIES:  Warm and well perfused.  There is no lower extremity  edema.   DIAGNOSTIC TEST:  Chest x-ray obtained today is reviewed.  This  demonstrates clear lung fields bilaterally.  The previous opacity in the  right lower and right middle lobe has resolved.  No other abnormalities  are noted.   IMPRESSION:  The patient continues to do quite well.  I have encouraged  him to continue to increase his activity as tolerated with his only  limitation at this point remaining that he refrain from heavy lifting or  strenuous use of his arms or shoulders for at least another month or so.  I think he can go back to resume driving an automobile.  I think he  could have his cataract surgery done this fall as he had previously  planned.  All of his questions have been addressed.  In  the  future, he will call or return to see [place] here at [facility] only should further problems or difficulties arise.   Salvatore Decent. Cornelius Moras, M.D.  Electronically Signed   CHO/MEDQ  D:  05/27/2008  T:  05/27/2008  Job:  [id number]   cc:   Dani Gobble, MD  Patrica Duel, M.D.

## 2011-03-03 ENCOUNTER — Encounter: Payer: Self-pay | Admitting: Pulmonary Disease

## 2011-03-03 ENCOUNTER — Ambulatory Visit (INDEPENDENT_AMBULATORY_CARE_PROVIDER_SITE_OTHER): Payer: Medicare Other | Admitting: Pulmonary Disease

## 2011-03-03 VITALS — BP 106/62 | HR 86 | Temp 97.6°F | Ht 71.0 in | Wt 140.4 lb

## 2011-03-03 DIAGNOSIS — R05 Cough: Secondary | ICD-10-CM

## 2011-03-03 DIAGNOSIS — R053 Chronic cough: Secondary | ICD-10-CM

## 2011-03-03 MED ORDER — GUAIFENESIN-CODEINE 100-10 MG/5ML PO SYRP: ORAL_SOLUTION | ORAL | Status: DC

## 2011-03-03 MED ORDER — PREDNISONE 10 MG PO TABS: ORAL_TABLET | ORAL | Status: DC

## 2011-03-03 NOTE — Patient Instructions (Signed)
Take nexium samples 40mg  before dinner for the next 10 days in addition to your reflux medication you take in am's.   Get chlorpheniramine 8mg  over the counter and take at bedtime Will give you a course of prednisone.  Follow directions on prescription. Will refill cough medicine until you can see Dr. Vassie Loll again. followup with Dr. Vassie Loll in 2 weeks.

## 2011-03-03 NOTE — Progress Notes (Signed)
  Subjective:    Patient ID: [name], male    DOB: 1936/06/30, 75 y.o.   MRN: [id number]  HPI The pt is a 74y/o male who comes in for an acute sick visit.  He has known emphysema and chronic cough, and comes in for persistent cough that is not improving.  He is producing thick white to clear mucus, but cannot say whether is coming from chest or head. He denies sinus pressure or congestion.  He does feel a tickle in his throat, but unsure if there is postnasal drip.  He does have a h/o reflux, but does not feel he has heartburn.  He is not more short of breath than usual.  The question has been raised whether airway inflammation may be contributing to this, and Dr.[name] mentioned perhaps trying ICS.    Review of Systems  Constitutional: Negative for fever and unexpected weight change.  HENT: Positive for congestion and sore throat. Negative for ear pain, nosebleeds, rhinorrhea, sneezing, trouble swallowing, dental problem, postnasal drip and sinus pressure.   Eyes: Negative for redness and itching.  Respiratory: Positive for cough, shortness of breath and wheezing. Negative for chest tightness.   Cardiovascular: Positive for leg swelling. Negative for palpitations.  Gastrointestinal: Positive for constipation. Negative for nausea and vomiting.  Genitourinary: Negative for dysuria.  Musculoskeletal: Negative for joint swelling.  Skin: Negative for rash.  Neurological: Positive for headaches.  Hematological: Bruises/bleeds easily.  Psychiatric/Behavioral: Negative for dysphoric mood. The patient is not nervous/anxious.        Objective:   Physical Exam Thin male in nad Nares with narrowing bilat, but no purulence seen OP with ?draining down the back, no lesions or exudates. Chest with decreased bs, a few scattered crackles, no wheezing Cor distant, regular LE with mild edema, no cyanosis  Alert and oriented, moves all 4         Assessment & Plan:

## 2011-03-03 NOTE — Assessment & Plan Note (Signed)
The pt has an ongoing cough that is unclear whether is more upper airway vs lower airway in origin.  Would like to treat more aggressively for reflux, postnasal drip, and also a short course of prednisone for possible lower airway inflammation.  He is to let us know if his mucus becomes more purulent, and is to f/u with Dr. Vassie Loll in 2 weeks.

## 2011-03-05 NOTE — H&P (Signed)
NAMECANIO, WINOKUR            ACCOUNT NO.:  [id number]   MEDICAL RECORD NO.:  [id number]           PATIENT TYPE:   LOCATION:                                 FACILITY:   PHYSICIAN:  Edward L. Juanetta Gosling, M.D.     DATE OF BIRTH:   DATE OF ADMISSION:  DATE OF DISCHARGE:  LH                                HISTORY & PHYSICAL   PROCEDURE:  Bronchoscopy.   REASON FOR PROCEDURE:  Abnormal CT scan.   HISTORY:  Mr. Kazmi is a 75 year old who is undergoing bronchoscopy  because of abnormalities seen on a CT scan of the chest.  He apparently had  an evaluation for other problems.  He had a chest x-ray done, and then had  the CT scan.  The CT showed what may be a mucous lesion, but also may be  tumor about 6 cm above the carina.  He has been basically asymptomatic,  although he does have COPD and he has had some cough with some sputum  production.  He has had some shortness of breath.  No hemoptysis.  He has  lost about five pounds, but that has been generally intentional.   PAST MEDICAL HISTORY:  Positive for surgery for Barrett's esophagus and for  peptic ulcer disease.   MEDICATIONS:  He is currently taking Lipitor 10 mg daily, Elavil 100 mg at  bedtime, and Norvasc 5 mg daily, and aspirin __________ mg daily.   SOCIAL HISTORY:  He has not smoked cigarettes since 1999.   FAMILY HISTORY:  His father died of lung cancer.   PHYSICAL EXAMINATION:  VITAL SIGNS:  Blood pressure 100/70, pulse 84,  respirations 16.  Pupils are reactive to light and accommodation.  HEENT:  Ears, nose and throat are clear.  NECK:  Supple.  CHEST:  Clear without wheezes.  HEART:  Regular without gallop.  ABDOMEN:  Soft.  EXTREMITIES:  Showed no edema.   ASSESSMENT:  He has an abnormal CT scan and needs to have bronchoscopy.  We  will rule out a carcinoma, and this is going to be performed on the 14th.     Edwa   ELH/MEDQ  D:  08/20/2004  T:  08/20/2004  Job:  [id number]   cc:   [facility]  Fax: [phone]

## 2011-03-05 NOTE — Op Note (Signed)
NAME:  [name], [name]                      ACCOUNT NO.:  [id number]   MEDICAL RECORD NO.:  [id number]                   PATIENT TYPE:  AMB   LOCATION:  DAY                                  FACILITY:  [facility]   PHYSICIAN:  R. Roetta Sessions, M.D.              DATE OF BIRTH:  05/03/1936   DATE OF PROCEDURE:  03/30/2004  DATE OF DISCHARGE:                                 OPERATIVE REPORT   PROCEDURE PERFORMED:  Esophagogastroduodenoscopy with esophageal biopsy.   ENDOSCOPIST:  [name], M.D.   INDICATIONS FOR PROCEDURE:  The patient is a 75 year old gentleman with a  history of Barrett's esophagus.  He is here for surveillance.  His last EGD  was a few years ago.  He is reporting intermittent esophageal dysphagia  today although he denied any when he was seen in the office back on Mar 09, 2004.  He has not had any melena, no really steady nausea or vomiting.  EGD  is now being done.  This approach has been discussed with the patient at  length.  Potential risks, benefits and alternatives have been reviewed,  questions answered.   Oxygen saturations, blood pressure, pulse and respirations were monitored  throughout the entire procedure.  Conscious sedation with Versed 2 mg,  Demerol 50 mg IV in divided doses.  Instrument Olympus video chip system.  Cetacaine spray for topical pharyngeal anesthesia.   FINDINGS:  Examination of the tubular esophagus revealed a patulous  esophagogastric junction and a couple of tongues of salmon colored  epithelium coming up.  I got 3 cm above what appears to be the EG junction.  There was no evidence of stricture, ring or neoplastic process.  Again EG  junction easily traversed.   Stomach:  The gastric cavity had much food debris within it which precluded  complete exam.  The gastric outlet was patent.  The efferent small bowel  limb mucosa appeared normal.   THERAPY/DIAGNOSTIC MANEUVERS PERFORMED:  Biopsies of the two tongues of  salmon  colored epithelium distal esophagus were taken for histologic study.  The patient tolerated the procedure well and was reacted in endoscopy.   IMPRESSION:  1. Salmon colored epithelium distal esophagus as described above, consistent     with Barrett's esophagus, biopsied.  Patulous esophagogastric junction.  2. Residual stomach had much food debris within it precluding complete exam.     Patent gastric outlet, normal small bowel mucosa.   RECOMMENDATIONS:  1. Followup on pathology.  2. Because he is having symptoms of central dysphagia, will go ahead and get     a barium pill and esophagram at a later date.  Further recommendations to     follow.      ___________________________________________  [name], M.D.   RMR/MEDQ  D:  03/30/2004  T:  03/30/2004  Job:  [id number]   cc:   Patrica Duel, M.D.  [address]  [place]  [place] [address]  Fax: [phone]

## 2011-03-05 NOTE — Op Note (Signed)
[facility]  Patient:    [name], [name] Visit Number: [id number] MRN: [id number]          Service Type: END Location: DAY Attending Physician:  [name] Dictated by:   Roetta Sessions, M.D. Proc. Date: 11/03/01 Admit Date:  11/03/2001 Discharge Date: 11/03/2001                             Operative Report  PROCEDURE:  Esophagogastroduodenoscopy with biopsy.  INDICATIONS FOR PROCEDURE:  The patient is a 75 year old gentleman with a history of reflux, biopsy-proven Barretts esophagus.  Last surveillance examination was approximately 2-1/2 years ago.  He now returns for surveillance.  Reflux is well controlled on Aciphex.  No odynophagia or dysphagia.  No abdominal pain.  No melena or rectal bleeding.  He does have a new complaint of a three-month period of hoarseness in the wintertime every year.  He is currently hoarse and wonders about this.  The other nine months of so during the year he really has no trouble with his voice.  An EGD is now being done as a surveillance maneuver.  This approach has been discussed with the patient at length previously and again at the bedside.  Please see my _____  consultation note.  The patient is at low risk for conscious sedation.   DESCRIPTION OF PROCEDURE:  Oxygen saturation, blood pressure, pulse, respirations were monitored throughout the entire procedure.  Conscious sedation:  Versed 2 mg IV, Demerol 25 mg IV.  Instrument was the Olympus videotape gastroscope.  FINDINGS:  Examination of tubular esophagus again revealed a 3-4 cm patch of salmon colored epithelium coming up into the distal esophagus.  The EG junction was somewhat patulous.  There were no neoplasm, ring or stricture. The EG junction was easily traversed.  STOMACH:  The gastric cavity was empty, insufflated well with air.  A thorough examination of the gastric mucosa including retroflexed view of the proximal stomach,  esophagogastric junction demonstrated only a small hiatal hernia. Pylorus was patent and easily traversed.  DUODENUM:  Bulb and second portion appeared normal.  THERAPEUTIC/DIAGNOSTIC MANEUVERS PERFORMED:  The patch of salmon-colored epithelium in the distal esophagus were biopsied x 2.  The patient tolerated the procedure well and was reactive to endoscopy.  IMPRESSION: 1. A patch of salmon-colored epithelium in the distal esophagus, unchanged    from prior exams, status post biopsy.  The remainder of the esophagus    appeared normal. 2. Small hiatal hernia. 3. The remainder of the upper gastrointestinal tract appeared normal.  RECOMMENDATIONS: 1. Continue Aciphex 20 mg orally daily indefinitely. 2. As far as the etiology of his hoarseness is concerned, the differential    is broad although he could have laryngopharyngeal reflux.  Periodic nature    of this symptom occurring only in the winter mitigating against a    significant reflux component.  I have recommended to Mr. Berkley that he    see an ENT physician for evaluation. 3. Follow up on biopsies. 4. At a minimum, Mr. Gramling should consider returning in two years for    surveillance examination.   Dictated by:   Roetta Sessions, M.D.  Attending Physician:  [name] DD:  11/03/01 TD:  11/05/01 Job: [id number]

## 2011-03-05 NOTE — Op Note (Signed)
[name], [name]            ACCOUNT NO.:  [id number]   MEDICAL RECORD NO.:  [id number]          PATIENT TYPE:  AMB   LOCATION:  DAY                           FACILITY:  [facility]   PHYSICIAN:  Edward L. Juanetta Gosling, M.D.DATE OF BIRTH:  04/03/1936   DATE OF PROCEDURE:  08/31/2004  DATE OF DISCHARGE:                                 OPERATIVE REPORT   PROCEDURE PERFORMED:  Bronchoscopy.   ENDOSCOPIST:  Oneal Deputy. Juanetta Gosling, M.D.   INDICATIONS FOR PROCEDURE:  [name] had a chest x-ray done as part of  an evaluation and this appeared to show some adenopathy.  He then underwent  a CT  scan which showed what appeared to be a thickening of the bronchus.  He is undergoing bronchoscopy to rule out endobronchial lesion.  There are  no contraindications to planned bronchoscopy.   PREOPERATIVE DIAGNOSIS:  Abnormal CT of the chest.   POSTOPERATIVE DIAGNOSIS:  Chronic bronchitis.   DESCRIPTION OF PROCEDURE:  After satisfactory local anesthesia and 4 mg of  Versed intravenously, the bronchoscope was introduced through the right  naris.  The upper airway was inspected and found to be within normal limits.  The vocal cord were identified and anesthetized with Xylocaine and the  bronchoscope was then introduced into the trachea.  There was a significant  amount of mucous in the trachea and he had bronchial pitting suggestive of  chronic bronchitis.  Both airways were inspected and there were no evidence  of endobronchial lesions and the area above the trachea was inspected with  no evidence of tracheal lesion.  Washings were done.  The procedure was then  terminated and he was taken to the recovery room in good condition.   ESTIMATED BLOOD LOSS:  None.     Edwa   ELH/MEDQ  D:  08/31/2004  T:  08/31/2004  Job:  [id number]   cc:   [facility]

## 2011-03-05 NOTE — Op Note (Signed)
[name], [name]            ACCOUNT NO.:  [id number]   MEDICAL RECORD NO.:  [id number]          PATIENT TYPE:  AMB   LOCATION:  DAY                           FACILITY:  [facility]   PHYSICIAN:  R. Roetta Sessions, M.D. DATE OF BIRTH:  [birth year]   DATE OF PROCEDURE:  10/22/2005  DATE OF DISCHARGE:                                 OPERATIVE REPORT   PROCEDURE:  Colonoscopy with snare polypectomy, lesion ablation.   INDICATIONS FOR PROCEDURE:  The patient is a 75 year old gentleman who comes  for screening colonoscopy. He has no lower GI tract symptoms. Colonoscopy is  now being as a screening maneuver. This approach has been discussed with the  patient at length. There is no family history of colorectal neoplasia.  Colonoscopy is now being done. Please see documentation in the medical  record.   PROCEDURE NOTE:  O2 saturation, blood pressure, pulse, and respirations were  monitored throughout the entire procedure. Conscious sedation with IV Versed  and Demerol in incremental doses.   FINDINGS:  Digital rectal exam revealed no abnormalities.   ENDOSCOPIC FINDINGS:  Prep was good.   Rectum:  Examination of the rectal mucosa including retroflexed view of the  anal verge revealed no abnormalities except for a 6-mm flat polyp and an  adjacent diminutive polyp. Please see photos. These were at 15 cm.   Colon:  Colonic mucosa was surveyed from the rectosigmoid junction through  the left, transverse, and right colon to the area of the appendiceal  orifice, ileocecal valve, and cecum. These structures were well seen and  photographed for the record. From this level, the scope was slowly  withdrawn, and all previously mentioned mucosal surfaces were again seen.  The patient had sigmoid diverticula. The remainder of the colonic mucosa  appeared normal. The flat polyp at 15 cm was removed with snare cautery and  recovered through the scope. The adjacent diminutive polyp was  destroyed/ablated  with the tip of the snare cautery. The patient tolerated  the procedure well and was reactive to endoscopy.   IMPRESSION:  1.  Rectal polyps at 15 cm treated as described above. Otherwise normal      rectum.  2.  Sigmoid diverticula. The remainder of the colonic mucosa appeared      normal.   RECOMMENDATIONS:  1.  No aspirin or arthritis medications for 10 days.  2.  Follow up on pathology.  3.  Diverticulosis literature provided to [name].  4.  Further recommendations to follow.      [name], M.D.  Electronically Signed     RMR/MEDQ  D:  10/22/2005  T:  10/22/2005  Job:  [id number]   cc:   [name], M.D.  Fax: [phone]

## 2011-03-05 NOTE — H&P (Signed)
NAMEGEZA, BERANEK            ACCOUNT NO.:  [id number]   MEDICAL RECORD NO.:  [id number]          PATIENT TYPE:  AMB   LOCATION:  DAY                           FACILITY:  [facility]   PHYSICIAN:  R. Roetta Sessions, M.D. DATE OF BIRTH:  01-Apr-1936   DATE OF ADMISSION:  09/30/2005  DATE OF DISCHARGE:  LH                                HISTORY & PHYSICAL   DIAGNOSES:  1.  History of Barrett's esophagus.  2.  History of colorectal cancer.   HISTORY OF PRESENT ILLNESS:  Mr. Hisham Provence is a pleasant 75 year old  Caucasian male with history of documented short segment Barrett's esophagus.  His reflux symptoms are now well controlled on Aciphex.  He is here to  discuss colonoscopy.  He has never had a colonoscopy.  He has had a  sigmoidoscopy with contrast barium enema in [place] back in 1997 without  significant findings.  He did have dysphagia previously with those symptoms  that resolved.  Last EGD was March 30, 2004, at which time he had  __________epithelium.  Biopsy consistent with Barrett's esophagus.  Stomach  was incompletely seen because of retained food and debris.  He is not having  any symptoms __________.  No melena.  No rectal bleeding.  There is no  family history of colorectal neoplasia.   PAST MEDICAL HISTORY:  1.  Chronic obstructive pulmonary disease.  2.  H.pylori infection treated.  3.  History of peptic ulcer disease with perforation treated at [facility].  4.  Postoperative wound dehiscence.  5.  History of hypertension.   CURRENT MEDICATIONS:  1.  Aciphex 20 mg orally daily.  2.  HCTZ one half 25 mg tablet daily.  3.  Norvasc 5 mg daily.  4.  Amitriptyline 100 mg in the morning.   ALLERGIES:  No known drug allergies.   FAMILY HISTORY:  Negative for chronic GI or liver disease.   SOCIAL HISTORY:  No tobacco, alcohol or NSAIDS.   REVIEW OF SYSTEMS:  As above.  No chest pain, dyspnea on exertion, fever,  chills or change in  weight.   PHYSICAL EXAMINATION:  GENERAL APPEARANCE:  Pleasant 75 year old gentleman,  resting comfortably.  VITAL SIGNS:  Weight 152, height 5 feet 11 inches, temperature 97.7, blood  pressure 112/70, pulse 72.  SKIN:  Warm and dry.  There is no jaundice.  HEENT:  No scleral icterus.  Conjunctivae are pink.  CHEST:  Lungs are clear to auscultation.  Breath sounds are distant  bilaterally.  CARDIAC:  Regular rate and rhythm without murmurs, rubs or gallops.  ABDOMEN:  Well-healed surgical scar, positive bowel sounds, soft, nontender.  No appreciable mass or organomegaly.  RECTAL:  Deferred pending colonoscopy.   ASSESSMENT:  Mr. Adonai Selsor is a pleasant 75 year old gentleman with  history of Barrett's esophagus/GERD.  Reflux symptoms are well controlled on  Aciphex.  He wants a cheaper PPI.   He has never had direct imaging of his entire lower GI tract.   RECOMMENDATIONS:  1.  Continue Aciphex 20 mg orally daily until his prescription runs out.  I      will get him samples for today. I told him he can go ahead and start      taking Prilosec OTC 20 mg orally daily, but he should stay on PPI      indefinitely.  I will plan a surveillance of esophagus in 2008.  2.  Proceed with screening colonoscopy January 2007.  Potential risks,      benefits, alternatives have been reviewed, questions answered.  We will      get him set up within the first couple of weeks in January.      [name], M.D.  Electronically Signed     RMR/MEDQ  D:  09/30/2005  T:  09/30/2005  Job:  [id number]   cc:   Patrica Duel, M.D.  Fax: [phone]   R. Roetta Sessions, M.D.  [address]  [place]  [place] [address]

## 2011-03-05 NOTE — H&P (Signed)
NAME:  [name], [name]                ACCOUNT NO.:  [id number]   MEDICAL RECORD NO.:  [id number]                  PATIENT TYPE:   LOCATION:                                       FACILITY:   PHYSICIAN:  R. Roetta Sessions, M.D.              DATE OF BIRTH:  03-26-1936   DATE OF ADMISSION:  DATE OF DISCHARGE:                                HISTORY & PHYSICAL   CHIEF COMPLAINT:  History of GERD; follow up Barrett's esophagus.   HISTORY OF PRESENT ILLNESS:  [name] is a pleasant 75 year old  gentleman with a history of Barrett's esophagus.  The last surveillance exam  was November 04, 2003.  He was found at that time to have biopsies consistent  with Barrett's metaplasia without dysplasia.  He is back for consideration  of __________ examination.  At this time, he is not having any dysphagia, no  early satiety, nausea, vomiting.  Reflux symptoms are well controlled on  Aciphex.   PAST MEDICAL HISTORY:  1. Significant for COPD.  2. History of H. pylori infection, treated.  3. History of peptic ulcer disease with perforation treated at [facility].  Postoperative wound dehiscence.  It sounds as though he had a     sigmoidoscopy and barium enema done in [place] back in 1997 or so     without significant findings.  I do not have reports.  He has never had a     full colonoscopy.  4. Hypertension.   CURRENT MEDICATIONS:  1. Aciphex 20 mg daily.  2. HCTZ half a tablet daily.  3. Norvasc daily.  4. Amitriptyline, not currently taking.   ALLERGIES:  No known drug allergies.   FAMILY HISTORY:  Noncontributory.   SOCIAL HISTORY:  Noncontributory.   No history of chronic GI or liver illness.   REVIEW OF SYSTEMS:  No chest pain, dyspnea on exertion, fever, chills,  change in weight.   PHYSICAL EXAMINATION:  GENERAL:  A pleasant 75 year old gentleman resting  comfortably.  VITAL SIGNS:  Weight 157.5, height 5'11, blood pressure 100/70, pulse 74.  SKIN:   Warm and dry.  HEENT:  No scleral icterus.  Oral cavity - no lesions.  JVD is not  prominent.  CHEST:  Lungs are clear to auscultation.  CARDIAC:  Regular rate and rhythm without murmur, gallop, or rub.  ABDOMEN:  Nondistended.  Positive bowel sounds.  Well-healed laparotomy  scar.  Soft, nontender, without appreciable mass or organomegaly.  EXTREMITIES:  No edema.   IMPRESSION:  [name] is a 75 year old gentleman with short-  segment Barrett's esophagus, with long-standing gastroesophageal reflux  disease.  Reflux symptoms well controlled.  He is really not having any GI  symptoms.  It is time for surveillance EGD.  I have discussed this approach  with [name].  The potential risks, benefits, and alternatives have  been reviewed.  Questions answered, and he is agreeable.  Will plan to  perform  EGD with biopsy as appropriate in the near future at [facility].   I note he has never had a colonoscopy.  Will discuss this with him, and at  some point later in the year, he should consider having a screening  colonoscopy.     ___________________________________________                                         [name], M.D.   RMR/MEDQ  D:  03/09/2004  T:  03/09/2004  Job:  [id number]   cc:   Patrica Duel, M.D.  [address], [address]  [place]  [place] [address]  Fax: [phone]

## 2011-03-17 ENCOUNTER — Ambulatory Visit (INDEPENDENT_AMBULATORY_CARE_PROVIDER_SITE_OTHER): Payer: Medicare Other | Admitting: Internal Medicine

## 2011-03-17 ENCOUNTER — Other Ambulatory Visit: Payer: Self-pay

## 2011-03-17 ENCOUNTER — Encounter: Payer: Self-pay | Admitting: Internal Medicine

## 2011-03-17 VITALS — BP 110/60 | HR 79 | Temp 97.6°F | Ht 71.0 in | Wt 140.6 lb

## 2011-03-17 DIAGNOSIS — Z86010 Personal history of colonic polyps: Secondary | ICD-10-CM

## 2011-03-17 NOTE — Progress Notes (Signed)
Primary Care Physician:  Cassell Smiles., MD  Primary Gastroenterologist:  Ivar Domangue  Chief Complaint  Patient presents with  . Follow-up    want to talk about running the light/cough alot not feeling well    HPI:  [name] is a 75 y.o. male here for followup. History of short segment Barrett's esophagus with low-grade dysplasia on EGD in the past however his last EGD with biopsies last year demonstrated that Barrett's only when the dysplasia. He's never really had the reflux symptoms. He's been maintained on omeprazole 20 mg orally daily pretty is chronically constipated and takes a narcotic cough suppressants for cough.  In fact, chronic cough is his main complaint today. He was tried on a one-week course of Nexium prescribed elsewhere without any effect on call thinking he might have a reflux component. He does have significant sinus drainage and problems with allergies. Significant COPD and coronary artery disease. Is not on ACE inhibitor. No dysphagia or not patient this time. He has any nausea or vomiting chronically constipated one bowel movement every 2-3 days takes Colace.  History of a colonoscopy 2007 -  removal of polyps. I cannot find a path in the chart. He relates to me a positive family history of colon cancer in his father.  History of complicated peptic ulcer disease (perforation) requiring laparotomy. History of H. pylori treated  Past Medical History  Diagnosis Date  . Hypertension   . Hyperlipidemia     excellent control  . Chronic kidney disease, stage III (moderate)   . Barrett's esophagus     frequent surveillance EGDs by Dr. Jena Gauss  . Chronic obstructive pulmonary disease     severe emphysema  . Cough, persistent   . Hyperkalemia     peak of 6.2 in 2009  . Colonic polyp     resected 2007 via colonoscope  . Diverticulosis   . Pneumonia     10/2010  . Coronary artery disease     CABG in 03/2008 for severe left main disease with critical RCA stenosis;  EF of 45-50% with inferior wall hypokinesis; echocardiogram in 10/2008-no change in LV function; stress nuclear-conflicting information in report indicating inferior reversibility but inferior scar  . Gastroesophageal reflux disease   . Peptic ulcer disease     perforation and surgical repair at [facility] in 1999; treated for positive Helicobacter pylori  . History of orthostatic hypotension     noted following CABG surgery    Past Surgical History  Procedure Date  . Laparoscopic gastrotomy w/ repair of ulcer     1999  . Coronary artery bypass graft 03/2008  . Colon surgery     Current Outpatient Prescriptions  Medication Sig Dispense Refill  . albuterol (PROAIR HFA) 108 (90 BASE) MCG/ACT inhaler Inhale 2 puffs into the lungs every 4 (four) hours as needed.        Marland Kitchen albuterol (PROVENTIL) (2.5 MG/3ML) 0.083% nebulizer solution Take 2.5 mg by nebulization every 6 (six) hours as needed.        Marland Kitchen albuterol (PROVENTIL) 2 MG tablet Take 2 mg by mouth daily.        Marland Kitchen amitriptyline (ELAVIL) 100 MG tablet 100 mg at bedtime.       Marland Kitchen amLODipine (NORVASC) 10 MG tablet Take 10 mg by mouth daily.       Marland Kitchen aspirin 81 MG tablet Take 81 mg by mouth daily.        Marland Kitchen dextromethorphan-guaiFENesin (MUCINEX DM) 30-600 MG per 12 hr tablet  Take 1 tablet by mouth every morning.        . docusate sodium (COLACE) 100 MG capsule Take 100 mg by mouth 2 (two) times daily.        . furosemide (LASIX) 20 MG tablet Take 1 tablet by mouth Once daily as needed.      Marland Kitchen guaiFENesin-codeine (CHERATUSSIN AC) 100-10 MG/5ML syrup 1/2 to 1 tsp at bedtime  180 mL  0  . hydrochlorothiazide 25 MG tablet Take 25 mg by mouth daily.        . Omega-3 Fatty Acids (EQL FISH OIL) 1000 MG CAPS Take 2 capsules (2,000 mg total) by mouth daily.  60 capsule  3  . omeprazole (PRILOSEC) 20 MG capsule Take 20 mg by mouth daily. 30 minutes before breakfast       . pravastatin (PRAVACHOL) 80 MG tablet Take 1 tablet (80 mg total) by mouth daily.  30  tablet  3  . tiotropium (SPIRIVA) 18 MCG inhalation capsule Place 1 capsule (18 mcg total) into inhaler and inhale daily.  90 capsule  1  . polyethylene glycol (MIRALAX / GLYCOLAX) packet Take 17 g by mouth at bedtime.        . polyethylene glycol-electrolytes (NULYTELY) 420 G solution         Allergies as of 03/17/2011  . (No Known Allergies)    Family History  Problem Relation Age of Onset  . Heart disease Mother   . Heart disease Father     History   Social History  . Marital Status: Married    Spouse Name: N/A    Number of Children: 2  . Years of Education: N/A   Occupational History  . retired     Animal nutritionist   Social History Main Topics  . Smoking status: Former Smoker -- 1.0 packs/day for 20 years    Types: Cigarettes, Pipe, Cigars    Quit date: 01/12/1998  . Smokeless tobacco: Never Used  . Alcohol Use: Yes     rare wine  . Drug Use: No  . Sexually Active: Not on file   Other Topics Concern  . Not on file   Social History Narrative  . No narrative on file    Review of Systems: Gen: Denies any fever, chills, sweats, anorexia, fatigue, weakness, malaise, weight loss, and sleep disorder CV: Denies chest pain, angina, palpitations, syncope, orthopnea, PND, peripheral edema, and claudication. Resp: dyspnea with exercise, cough, sputum, wheezing, coughing up blood, and pleurisy. GI: Denies vomiting blood, jaundice, and fecal incontinence.   Denies dysphagia or odynophagia. Derm: Denies rash, itching, dry skin, hives, moles, warts, or unhealing ulcers.  Psych: Denies depression, anxiety, memory loss, suicidal ideation, hallucinations, paranoia, and confusion. Heme: Denies bruising, bleeding, and enlarged lymph nodes.   Physical Exam: BP 110/60  Pulse 79  Temp(Src) 97.6 F (36.4 C) (Temporal)  Ht 5\' 11"  (1.803 m)  Wt 140 lb 9.6 oz (63.776 kg)  BMI 19.61 kg/m2 General:   Alert,  Well-developed, well-nourished, pleasant and cooperative in NAD Head:  Normocephalic  and atraumatic. Eyes:  Sclera clear, no icterus.   Conjunctiva pink. Mouth:  No deformity or lesions, dentition normal. Neck:  Supple; no masses or thyromegaly. Heart:  Regular rate and rhythm; no murmurs, clicks, rubs,  or gallops. Abdomen:  Soft, nontender and nondistended. Well healed surgical scar.  No masses, hepatosplenomegaly or hernias noted. Normal bowel sounds, without guarding, and without rebound.   Msk:  Symmetrical without gross deformities. Normal posture. Pulses:  Normal pulses noted. Extremities:  Without clubbing or edema. Neurologic:  Alert and  oriented x4;  grossly normal neurologically. Skin:  Intact without significant lesions or rashes. Cervical Nodes:  No significant cervical adenopathy. Psych:  Alert and cooperative. Normal mood and affect.

## 2011-03-22 ENCOUNTER — Encounter: Payer: Self-pay | Admitting: Pulmonary Disease

## 2011-03-22 ENCOUNTER — Ambulatory Visit (INDEPENDENT_AMBULATORY_CARE_PROVIDER_SITE_OTHER): Payer: Medicare Other | Admitting: Pulmonary Disease

## 2011-03-22 DIAGNOSIS — J449 Chronic obstructive pulmonary disease, unspecified: Secondary | ICD-10-CM

## 2011-03-22 DIAGNOSIS — R053 Chronic cough: Secondary | ICD-10-CM

## 2011-03-22 DIAGNOSIS — R05 Cough: Secondary | ICD-10-CM

## 2011-03-22 DIAGNOSIS — J4489 Other specified chronic obstructive pulmonary disease: Secondary | ICD-10-CM

## 2011-03-22 NOTE — Patient Instructions (Signed)
Trial of advair 250/50 twice daily  Keep taking spiriva Ask [name] about nexium instead of omeprazole for barrett's Take DELSYM cough syrup instead of mucinex - this may help you cut down on the codeine syrup Codenie will make you constipated

## 2011-03-22 NOTE — Progress Notes (Signed)
  Subjective:    Patient ID: [name], male    DOB: 01-Jan-1936, 75 y.o.   MRN: [id number]  HPI  PMD - [name]   74/M, ex- smokerfor FU of severe COPD on nocturnal O2 since 5/11 & cough.  5/10 PFTs >> severe obstruction, FEV1 39%, no BD response  He developed a chronic cough.since his heart surgery by [name] in 6/09. Cough drops & cough syrups only give temporary relief. Albuterol nebs & MDI are not very helpful. Did not feel that symbicort helped  Barrett's esophagus 3 cm noted to be stable  by [name].  H/o sinus surgery, tonsillectomy  CT chest 4/09 severe emphysema, scattered sub-cm nodules stable since 2005  Hosp adm in 2/12 >> BIBEMS due to cough syncope - admitted to tele but transferred to 2900 for another syncopal event  CT chest 11/16/10 neg PE, stable nodules, small RLL infx Rxed for HCAP with cipro & zosyn  Modified Ba swallow nml   02/08/2011  Renal managing potassium now  O2 satn 86% on walking to the office from waiting room, improved to 90% on O2. Breathing better, dry cough +  CXR -improved infiltrate. He admits to not taking his inhalers as recommended. ONO shows low O2 level for > 1 hr during sleep, stay on O2  Now on regimen of spiriva & albuterol  03/22/2011 Interim visit with dr clance for persistent cough Given nexium samples 40mg  before dinner for the next 10 days in addition to omeprazole >> [name] planning cscopy Get chlorpheniramine 8mg  over the counter and take at bedtime >> helped Course of prednisone. Refilled cheratussin >> constipation Somewhat better during this visit       Review of Systems Pt denies any significant  nasal congestion or excess secretions, fever, chills, sweats, unintended wt loss, pleuritic or exertional cp, orthopnea pnd or leg swelling.  Pt also denies any obvious fluctuation in symptoms with weather or environmental change or other alleviating or aggravating factors.    Pt denies any increase in rescue therapy over  baseline, denies waking up needing it or having early am exacerbations or coughing/wheezing/ or dyspnea       Objective:   Physical Exam Gen. Pleasant, well-nourished, in no distress ENT - no lesions, no post nasal drip Neck: No JVD, no thyromegaly, no carotid bruits Lungs: no use of accessory muscles, no dullness to percussion, decreased  without rales or rhonchi  Cardiovascular: Rhythm regular, heart sounds  normal, no murmurs or gallops, no peripheral edema Musculoskeletal: No deformities, no cyanosis or clubbing         Assessment & Plan:

## 2011-03-23 NOTE — Assessment & Plan Note (Signed)
Trial of advair - discussed side effects , he has not tolerated symbicort prior. Would consider roflimulast in the future. Stay on O2 during sleep & exertion

## 2011-03-23 NOTE — Assessment & Plan Note (Signed)
Combination of COPD & GERD Delsym bid  - use cheratussin for breakthrough - miralax if he takes this to avoid constipation. Nexium seemed to work better than omeprazole - defer to GI if he needs this for Barrett's  - feel GERD may be contributing to cough

## 2011-03-26 ENCOUNTER — Encounter: Payer: Self-pay | Admitting: Internal Medicine

## 2011-03-26 DIAGNOSIS — Z86010 Personal history of colonic polyps: Secondary | ICD-10-CM | POA: Insufficient documentation

## 2011-03-26 NOTE — Assessment & Plan Note (Signed)
Pleasant 75 year old gentleman with multiple medical problems including history of complicated peptic ulcer disease and GERD and history of colonic polyps. He is up-to-date on Barrett's surveillance. He is due for a repeat EGD in 2014. No alarm symptoms at this time. He has a developed a cough the etiology which is not defined. I doubt he has reflux-induced cough. I would think of any number of the ENT or pulmonary etiologies firstly. I've advised him to followup with his primary care physician and pulmonologist for further evaluation. I do not feel that further GI evaluation in terms of pH/impedance studies are warranted at this time.   History of colonic polyps and positive family history of colon cancer in the setting of constipation with regular narcotic cough syrup use.  His last colonoscopy was in 2007. He needs to have a colonoscopy this time. I discussed the risks, benefits, limitations, alternatives and imponderables with the patient and spouse. Questions been answered. All parties agreeable. We'll plan to set this up in the near future the hospital. I've advised him to continue Colace as needed as a stool softener. Minimize the use of narcotic cough syrup. He may use MiraLax 17 g orally at bedtime on a nightly when necessary basis for constipation. A fiber supplement may be helpful as well. Further recommendations to follow.

## 2011-03-29 ENCOUNTER — Emergency Department (HOSPITAL_COMMUNITY): Payer: Medicare Other

## 2011-03-29 ENCOUNTER — Inpatient Hospital Stay (HOSPITAL_COMMUNITY)
Admission: RE | Admit: 2011-03-29 | Discharge: 2011-03-31 | DRG: 641 | Disposition: A | Discharge status: Home / Self Care | Payer: Medicare Other | Source: Ambulatory Visit | Attending: Internal Medicine | Admitting: Internal Medicine

## 2011-03-29 ENCOUNTER — Encounter: Payer: Medicare Other | Admitting: Internal Medicine

## 2011-03-29 DIAGNOSIS — I10 Essential (primary) hypertension: Secondary | ICD-10-CM | POA: Diagnosis present

## 2011-03-29 DIAGNOSIS — J4489 Other specified chronic obstructive pulmonary disease: Secondary | ICD-10-CM | POA: Diagnosis present

## 2011-03-29 DIAGNOSIS — K2270 Barrett's esophagus without dysplasia: Secondary | ICD-10-CM | POA: Diagnosis present

## 2011-03-29 DIAGNOSIS — K5730 Diverticulosis of large intestine without perforation or abscess without bleeding: Secondary | ICD-10-CM | POA: Diagnosis present

## 2011-03-29 DIAGNOSIS — I2510 Atherosclerotic heart disease of native coronary artery without angina pectoris: Secondary | ICD-10-CM | POA: Diagnosis present

## 2011-03-29 DIAGNOSIS — R631 Polydipsia: Secondary | ICD-10-CM | POA: Diagnosis present

## 2011-03-29 DIAGNOSIS — Z9981 Dependence on supplemental oxygen: Secondary | ICD-10-CM

## 2011-03-29 DIAGNOSIS — E876 Hypokalemia: Secondary | ICD-10-CM | POA: Diagnosis present

## 2011-03-29 DIAGNOSIS — E871 Hypo-osmolality and hyponatremia: Principal | ICD-10-CM | POA: Diagnosis present

## 2011-03-29 DIAGNOSIS — J449 Chronic obstructive pulmonary disease, unspecified: Secondary | ICD-10-CM | POA: Diagnosis present

## 2011-03-29 DIAGNOSIS — T493X5A Adverse effect of emollients, demulcents and protectants, initial encounter: Secondary | ICD-10-CM | POA: Diagnosis present

## 2011-03-29 DIAGNOSIS — K620 Anal polyp: Secondary | ICD-10-CM | POA: Diagnosis present

## 2011-03-29 DIAGNOSIS — R197 Diarrhea, unspecified: Secondary | ICD-10-CM | POA: Diagnosis present

## 2011-03-29 LAB — CARDIAC PANEL(CRET KIN+CKTOT+MB+TROPI)
CK, MB: 6.7 ng/mL (ref 0.3–4.0)
CK, MB: 6.2 ng/mL (ref 0.3–4.0)
Relative Index: 3.6 — ABNORMAL HIGH (ref 0.0–2.5)
Total CK: 188 U/L (ref 7–232)

## 2011-03-29 LAB — URINALYSIS, ROUTINE W REFLEX MICROSCOPIC
Leukocytes, UA: NEGATIVE
Protein, ur: NEGATIVE mg/dL
Urobilinogen, UA: 0.2 mg/dL (ref 0.0–1.0)

## 2011-03-29 LAB — CBC
HCT: 38.1 % — ABNORMAL LOW (ref 39.0–52.0)
Hemoglobin: 14.2 g/dL (ref 13.0–17.0)
MCHC: 37.3 g/dL — ABNORMAL HIGH (ref 30.0–36.0)

## 2011-03-29 LAB — BASIC METABOLIC PANEL
Calcium: 8.7 mg/dL (ref 8.4–10.5)
Calcium: 8.7 mg/dL (ref 8.4–10.5)
Creatinine, Ser: <0.47 mg/dL (ref 0.4–1.5)
Creatinine, Ser: 0.54 mg/dL (ref 0.4–1.5)
GFR calc non Af Amer: >60 mL/min (ref >60)
Glucose, Bld: 107 mg/dL — ABNORMAL HIGH (ref 70–99)
Glucose, Bld: 74 mg/dL (ref 70–99)
Sodium: 122 mEq/L — ABNORMAL LOW (ref 135–145)
Sodium: 122 mEq/L — ABNORMAL LOW (ref 135–145)

## 2011-03-29 LAB — T4, FREE: Free T4: 1.23 ng/dL (ref 0.80–1.80)

## 2011-03-29 LAB — DIFFERENTIAL
Basophils Absolute: 0 10*3/uL (ref 0.0–0.1)
Lymphocytes Relative: 9 % — ABNORMAL LOW (ref 12–46)
Lymphs Abs: 0.7 10*3/uL (ref 0.7–4.0)
Monocytes Absolute: 0.7 10*3/uL (ref 0.1–1.0)
Monocytes Relative: 8 % (ref 3–12)
Neutro Abs: 6.9 10*3/uL (ref 1.7–7.7)

## 2011-03-29 LAB — MRSA PCR SCREENING: MRSA by PCR: NEGATIVE

## 2011-03-29 LAB — COMPREHENSIVE METABOLIC PANEL
BUN: 5 mg/dL — ABNORMAL LOW (ref 6–23)
CO2: 27 mEq/L (ref 19–32)
Chloride: 80 mEq/L — ABNORMAL LOW (ref 96–112)
Creatinine, Ser: 0.47 mg/dL (ref 0.4–1.5)
GFR calc Af Amer: >60 mL/min (ref >60)
GFR calc non Af Amer: >60 mL/min (ref >60)
Total Bilirubin: 1.2 mg/dL (ref 0.3–1.2)

## 2011-03-29 LAB — LIPID PANEL
Cholesterol: 162 mg/dL (ref 0–200)
VLDL: 9 mg/dL (ref 0–40)

## 2011-03-29 LAB — POCT I-STAT, CHEM 8
Creatinine, Ser: 0.8 mg/dL (ref 0.4–1.5)
Glucose, Bld: 120 mg/dL — ABNORMAL HIGH (ref 70–99)
Hemoglobin: 14.6 g/dL (ref 13.0–17.0)
Potassium: 2.5 mEq/L — CL (ref 3.5–5.1)

## 2011-03-29 LAB — TSH: TSH: 1.211 u[IU]/mL (ref 0.350–4.500)

## 2011-03-29 LAB — OSMOLALITY: Osmolality: 242 mOsm/kg — ABNORMAL LOW (ref 275–300)

## 2011-03-29 LAB — CORTISOL: Cortisol, Plasma: 13.7 ug/dL

## 2011-03-29 LAB — CREATININE, URINE, RANDOM: Creatinine, Urine: 37.7 mg/dL

## 2011-03-29 NOTE — H&P (Signed)
Joseph [name], [name]            ACCOUNT NO.:  [id number]  MEDICAL RECORD NO.:  [id number]  LOCATION:  [facility]                          FACILITY:  [facility]  PHYSICIAN:  Elliot Cousin, M.D.    DATE OF BIRTH:  [birth year]  DATE OF ADMISSION:  03/29/2011 DATE OF DISCHARGE:  LH                             HISTORY & PHYSICAL   PRIMARY CARE PHYSICIAN:  Madelin Rear. Sherwood Gambler, M.D.  PRIMARY NEPHROLOGIST:  Terrial Rhodes, M.D.  PRIMARY PULMONOLOGIST:  Oretha Milch, MD.  PRIMARY GASTROENTEROLOGIST:  [name], MD FACP [facility]  CHIEF COMPLAINT:  Nausea and mild confusion.  HISTORY OF PRESENT ILLNESS:  The patient is a 75 year old man with a past medical history significant for coronary artery disease, grade 1 diastolic dysfunction, severe oxygen-dependent COPD, and stage III chronic kidney disease.  He presented to the emergency department today from the endoscopy unit for a chief complaint of nausea and some confusion.  The patient was scheduled for a routine colonoscopy this morning to be performed by Dr. Jena Gauss.  Yesterday, he had been taking Golytely or another cathartic along with multiple laxatives in preparation for the colonoscopy this morning.  In addition to the laxative therapy, the patient had been drinking clear liquids in the form of broth, lemonade, popsicles, and water.  He  drank well over a gallon and perhaps over two gallons of liquids yesterday.  He had multiple loose and runny bowel movements as a consequence of the laxative therapy.  Last night, he developed nausea and dry heaving.  This lasted until this morning.  His wife also states that he had been a little confused.  He had been slow to answer, but he was aware of his surroundings.  The patient currently acknowledges that he may have been a little confused, but not severely so.  He acknowledges some dizziness and a frontal headache.  He denies difficulty swallowing, difficulty seeing, focal weakness, or numbness  on his face or extremities.  His wife has not noticed slurred speech, difficulty with him speaking or forming words, or any focal unilateral arm or leg weakness.  In the emergency department, the patient is noted to be afebrile and hemodynamically stable.  His EKG reveals normal sinus rhythm with a first-degree AV block, nonspecific intraventricular conduction delay, ST and T-wave abnormalities in the inferior leads, and heart rate of 85 beats per minute.  His lab data are significant for a serum sodium of 116, serum potassium of 2.4, chloride of 80, glucose of 123, BUN of 5, and creatinine of 0.47.  His WBC is relatively unremarkable.  His urinalysis reveals a trace of ketones, otherwise negative.  He is being admitted for further evaluation and management.  PAST MEDICAL HISTORY: 1. Coronary artery disease, status post three-vessel CABG in June     2009. 2. Cardiomyopathy with an ejection fraction ranging between 45 and 55%     per TEE in 2009. 3. Grade 1 diastolic dysfunction per 2-D echocardiogram in February     2012. 4. Severe oxygen-dependent COPD with chronic hypoxic respiratory     failure. 5. History of acute respiratory failure secondary to pneumonia in     February 2012. 6. Possible bibasilar bronchiectasis  per CT scan of his chest in     February 2012. 7. Stage III chronic kidney disease.  The patient also has a possible     history of renal tubular acidosis. 8. Hypertension. 9. Dyslipidemia. 10.Barrett's esophagus and low-grade dysplasia per EGDs in 2010 and     2011 by Dr. Jena Gauss. 11.History of perforated gastric ulcer, status post surgery in the     past. 12.Constipation.  MEDICATIONS: 1. Aspirin 81 mg daily. 2. Advair Diskus question dose, 1 puff b.i.d. 3. Spiriva 1 inhalation daily. 4. Albuterol, 1 tablet daily. 5. Albuterol nebulizer one nebulization daily. 6. Omeprazole 20 mg daily. 7. Lasix question dose daily. 8. Another diuretic question dose  daily. 9. Amitriptyline question dose q.h.s. 10. Home oxygen, 2 liters/minute.  ALLERGIES:  The patient has an allergy to codeine.  SOCIAL HISTORY:  The patient is married.  [place]  He has two children.  He is retired.  He denies tobacco, alcohol, and illicit drug use.  He still drives.  FAMILY HISTORY:  His mother died at 75 years of age of pneumonia.  She also had a history of heart disease and COPD.  His father died at 24 years of age.  It is unclear whether he died of cancer or heart disease.  REVIEW OF SYSTEMS:  As above in history of present illness.  In addition, the patient does have occasional swelling in his legs and ankles.  He is always short of breath.  Otherwise, review of systems is negative.  PHYSICAL EXAMINATION:  Temperature 97.7, blood pressure 134/70, pulse 85, respiratory rate 16, oxygen saturation 96% on 2 L of oxygen. GENERAL:  The patient is a pleasant 75 year old Caucasian man who is currently sitting up in bed, in no acute distress.  HEENT:  Head is normocephalic nontraumatic.  Pupils equal, round, and reactive to light. Extraocular movements are intact.  Conjunctivae are clear.  Sclerae are white.  Tympanic membranes not examined.  Nasal mucosa is dry.  No sinus tenderness.  Oropharynx reveals mildly dry mucous membranes.  No posterior exudates or erythema.  NECK:  Supple.  No adenopathy, no thyromegaly, no bruit, no JVD.  LUNGS:  Decreased breath sounds in the bases, otherwise clear without wheezing or crackles.  His breathing is nonlabored.  HEART:  S1, S2 distant with no murmurs, rubs, or gallops. His chest wall has a well-healed sternotomy scar, nontender..  ABDOMEN: There is a small periumbilical hernia that is reducible and nontender. There is a well-healed abdominal scar.  Bowel sounds are present.  His abdomen is nontender and nondistended.  No obvious hepatosplenomegaly or masses palpated.  GU and rectal  deferred.  EXTREMITIES:  Pedal pulses barely palpable.  Trace of pedal edema bilaterally.  Good range of motion of his extremities.  No acute hot red joints.  NEUROLOGIC:  The patient is alert and oriented to himself, place, his wife, year, month, and president.  No focal cranial nerve deficits as his cranial nerves are within normal limits grossly.  Hand grip bilaterally is 5/5.  The patient is able to lift both of his legs against gravity at approximately 45 degrees.  Sensation is grossly intact.  ADMISSION LABORATORIES:  The results of the EKG were dictated above. Urinalysis:  Trace ketones, otherwise negative.  Sodium 116, potassium 2.4, chloride 80, CO2 of 27, glucose is 123, BUN is 5, creatinine is 0.47, total bilirubin is 1.2, alkaline phosphatase is 70, SGOT is 21, SGPT is 11, total protein  is 6.4, albumin 3.3, calcium 8.2.  WBC 8.4, hemoglobin 14.2, platelet count 241.  ASSESSMENT: 1. Mild confusion, dizziness, and headache, likely secondary to severe     hyponatremia. 2. Severe hyponatremia.  The patient's serum sodium is 116.  According     to his wife, his blood work was relatively within normal limits     approximately 1 month ago.  There was no mention of a low sodium     level or an elevated potassium level as the patient did have a     history of hyperkalemia.  The differential diagnoses in this case     include primary polydipsia, syndrome of inappropriate antidiuretic     hormone, and diarrhea from laxative therapy in preparation for the     colonoscopy that was planned this morning. 3. Hypokalemia.  The patient has a history of hyperkalemia.  His serum     potassium is 2.4.  This may be the consequence of diarrhea, the     dilutional effects from hypotonic fluid intake yesterday, and Lasix     therapy. 4. Coronary artery disease and cardiomyopathy with diastolic     dysfunction.  The patient has no complaints of chest pain.  His     EKG; however is abnormal.  The  EKG abnormalities may be secondary     to the electrolyte abnormalities. 5. History of oxygen-dependent chronic obstructive pulmonary disease.     He is not wheezing on exam.  This appears to be stable. 6. Hypertension.  The patient's blood pressure is well controlled     currently. 7. History of Barrett's esophagitis.  He is on omeprazole currently.  PLAN: 1. The patient was given 1 gram of magnesium sulfate, one IV run of     potassium chloride, and 1 L of normal saline in the emergency     department.  He was also given 40 mEq of oral potassium chloride. 2. We will order a number of studies to assess the etiology of his     hyponatremia including a spot urine sodium, spot urine creatinine,     urine osmolality, serum osmolality, TSH, cortisol level, uric acid     level, etc.  We will also check a CT scan of his head and a chest     x-ray due to hyponatremia and his other subjective symptoms. 3. We will continue IV fluids with normal saline with potassium     chloride added at a low rate.  We will institute a fluid     restriction for now.  We will continue Lasix, but will try to     replete his potassium chloride first before     restarting it. 4. We will replete his potassium chloride with intravenous runs and     oral supplementation. 5. We will follow up on his serum sodium and potassium later and     in the next 24 hours. Elliot Cousin, M.D.     DF/MEDQ  D:  03/29/2011  T:  03/29/2011  Job:  [id number]  cc:   Madelin Rear. Sherwood Gambler, MD Fax: [phone]  Terrial Rhodes, M.D. Fax: [phone]  Oretha Milch, MD [address] [place] [address]  R. Roetta Sessions, MD FACP [facility] [address] [place] [address]  Electronically Signed by Elliot Cousin M.D. on 03/29/2011 05:30:21 PM

## 2011-03-30 ENCOUNTER — Other Ambulatory Visit: Payer: Self-pay | Admitting: Internal Medicine

## 2011-03-30 DIAGNOSIS — K5730 Diverticulosis of large intestine without perforation or abscess without bleeding: Secondary | ICD-10-CM

## 2011-03-30 DIAGNOSIS — Z800 Family history of malignant neoplasm of digestive organs: Secondary | ICD-10-CM

## 2011-03-30 DIAGNOSIS — Z1211 Encounter for screening for malignant neoplasm of colon: Secondary | ICD-10-CM

## 2011-03-30 DIAGNOSIS — E871 Hypo-osmolality and hyponatremia: Secondary | ICD-10-CM

## 2011-03-30 DIAGNOSIS — K621 Rectal polyp: Secondary | ICD-10-CM

## 2011-03-30 DIAGNOSIS — Z860100 Personal history of colon polyps, unspecified: Secondary | ICD-10-CM

## 2011-03-30 DIAGNOSIS — K620 Anal polyp: Secondary | ICD-10-CM

## 2011-03-30 LAB — BASIC METABOLIC PANEL
CO2: 26 mEq/L (ref 19–32)
Calcium: 8.5 mg/dL (ref 8.4–10.5)
Calcium: 7.8 mg/dL — ABNORMAL LOW (ref 8.4–10.5)
Creatinine, Ser: <0.47 mg/dL (ref 0.4–1.5)
Creatinine, Ser: 0.49 mg/dL (ref 0.4–1.5)
GFR calc non Af Amer: >60 mL/min (ref >60)
Sodium: 130 mEq/L — ABNORMAL LOW (ref 135–145)
Sodium: 135 mEq/L (ref 135–145)

## 2011-03-30 LAB — CARDIAC PANEL(CRET KIN+CKTOT+MB+TROPI)
CK, MB: 6 ng/mL — ABNORMAL HIGH (ref 0.3–4.0)
CK, MB: 5.5 ng/mL — ABNORMAL HIGH (ref 0.3–4.0)
Relative Index: 3.9 — ABNORMAL HIGH (ref 0.0–2.5)
Total CK: 153 U/L (ref 7–232)
Total CK: 139 U/L (ref 7–232)
Troponin I: <0.3 ng/mL (ref <0.30)

## 2011-03-30 LAB — CBC
MCH: 32.1 pg (ref 26.0–34.0)
MCHC: 36 g/dL (ref 30.0–36.0)
Platelets: 254 10*3/uL (ref 150–400)

## 2011-03-30 LAB — DIFFERENTIAL
Basophils Relative: 0 % (ref 0–1)
Eosinophils Absolute: 0.2 10*3/uL (ref 0.0–0.7)
Eosinophils Relative: 4 % (ref 0–5)
Monocytes Absolute: 0.8 10*3/uL (ref 0.1–1.0)
Monocytes Relative: 14 % — ABNORMAL HIGH (ref 3–12)
Neutrophils Relative %: 64 % (ref 43–77)

## 2011-03-31 LAB — BASIC METABOLIC PANEL
BUN: 4 mg/dL — ABNORMAL LOW (ref 6–23)
Calcium: 7.9 mg/dL — ABNORMAL LOW (ref 8.4–10.5)
Potassium: 3.5 mEq/L (ref 3.5–5.1)
Sodium: 136 mEq/L (ref 135–145)

## 2011-04-01 ENCOUNTER — Telehealth: Payer: Self-pay | Admitting: Pulmonary Disease

## 2011-04-01 MED ORDER — FLUTICASONE-SALMETEROL 250-50 MCG/DOSE IN AEPB: 1.0000 | INHALATION_SPRAY | Freq: Two times a day (BID) | RESPIRATORY_TRACT | Status: DC

## 2011-04-01 NOTE — Telephone Encounter (Signed)
Called and spoke with pt's wife.  Wife states pt was given sample of Advair from RA at OV on 6/4.  Wife states pt has improved and he is doing well on the advair.  Requesting rx be sent to pharmacy.  Please advise if this is ok or not.    ALSO: Just an 90- Wife states pt was recently released from hosp/ICU on Monday.  States pt was hospitalized because his "electrolytes were depleted."

## 2011-04-01 NOTE — Telephone Encounter (Signed)
rx sent. Pt aware.Jennifer Castillo, CMA  

## 2011-04-01 NOTE — Telephone Encounter (Signed)
Ok advair 250 1 puff bid x 30 ds x 3

## 2011-04-03 NOTE — Discharge Summary (Signed)
[name], [name]            ACCOUNT NO.:  [id number]  MEDICAL RECORD NO.:  [id number]  LOCATION:  [facility]                          FACILITY:  [facility]  PHYSICIAN:  [name], M.D.    DATE OF BIRTH:  1936-09-07  DATE OF ADMISSION:  03/29/2011 DATE OF DISCHARGE:  06/13/2012LH                              DISCHARGE SUMMARY   DISCHARGE DIAGNOSES: 1. Nausea, mild confusion, and dizziness, secondary to severe     hyponatremia and hypokalemia. 2. Severe hyponatremia secondary to excessive fluid intake/primary     polydipsia, and diarrhea from Golytely. 3. Hypokalemia, likely secondary to the dilutional effects of     hypotonic fluid, Lasix, hydrochlorothiazide, and diarrhea. 4. Loose stools/diarrhea secondary to Golytely in preparation for     an outpatient colonoscopy. 5. The patient underwent the colonoscopy during this hospitalization      on March 30, 2011.  The results revealed a     diminutive rectal polyp, status post cold biopsy removal otherwise     normal rectum, sigmoid diverticula, and a long redundant otherwise     normal colon. 6. All of the patient's other chronic medical conditions including     coronary artery disease, cardiomyopathy, severe oxygen-dependent     chronic obstructive pulmonary disease, hypertension, and Barrett     esophagitis remained stable.  DISCHARGE MEDICATIONS: 1. Potassium chloride 20 mEq daily for 7 more days and then     reevaluate. 2. Albuterol 2 mg daily. 3. Albuterol nebulization 1 vial every 6 hours as needed for shortness     of breath. 4. Amitriptyline 100 mg nightly p.r.n. for sleep. 5. Amlodipine 10 mg daily. 6. Aspirin 81 mg daily. 7. Bisoprolol 5 mg daily. 8. Docusate 100 mg b.i.d. 9. Fish oil 1 g 2 capsules daily. 10.Furosemide 20 mg daily as needed for fluid retention. 11.Lactulose 10 g/15 mL 2 teaspoons 4 times daily as needed for     constipation. 12.MiraLax laxative 17 g nightly p.r.n. for constipation. 13.Mucinex DM  1 tablet daily as needed. 14.Omeprazole 20 mg daily. 15.Pravastatin 80 mg nightly. 16.ProAir inhaler 2 puffs every 4 hours as needed for shortness of     breath. 17.Spiriva 18 mcg 1 capsule inhale daily. 18.Tylenol Extra Strength 500 mg 1-2 tablets every 6 hours as needed     for pain. 19.Hold hydrochlorothiazide 25 mg tablets until further evaluation     with your primary care physician or nephrologist.  DISCHARGE DISPOSITION:  The patient was discharged to home in improved and stable condition on March 31, 2011.  He will follow up with his primary care physician Dr. Sherwood [name] on June 19 at 2 o'clock p.m.  He will follow up with his nephrologist Dr. Arrie [name] as scheduled in July.  CONSULTATIONS:  Joseph Bellows, MD, [name]  PROCEDURES PERFORMED: 1. Colonoscopy on March 30, 2011.  The results were dictated above. 2. CT scan of the head without contrast on March 29, 2011.  The results     revealed mild cortical atrophy, mild microvascular white matter     disease, and arterial calcifications.  No definite acute or focal     intracranial abnormalities. 3. Chest x-ray on March 29, 2011.  The  results revealed severe     emphysema without acute findings.  HISTORY OF PRESENTING ILLNESS:  The patient is a 75 year old man with a past medical history significant for coronary artery disease, diastolic dysfunction, oxygen-dependent COPD, chronic kidney disease, and Barrett esophagitis.  He presented to the emergency department from the endoscopy unit for a chief complaint of nausea, confusion, and dizziness.  The patient had been scheduled for a routine colonoscopy on the morning of March 29, 2011, as scheduled by Dr. Jena [name].  24 hours previously, the patient had taken GoLytely and multiple laxatives in preparation for the colonoscopy.  Also, he admitted drinking over 2 gallons of liquids including water, lemonade, broth, and popsicles.  In the emergency department, the patient was noted to  be afebrile and hemodynamically stable.  His lab data were significant for a serum sodium of 116, serum potassium of 2.4, chloride of 80, BUN of 5, and creatinine of 0.47.  He was admitted for further evaluation and management.  HOSPITAL COURSE:  The patient was given 1 L of normal saline in the emergency department.  He was also given 40 mEq of potassium chloride orally and one intravenous run of potassium chloride.  It was suspected that he had primary polydipsia which caused severe hyponatremia and hypokalemia.  However, this was not confirmed until after laboratory results came back.  The rate of IV fluids with potassium chloride added was adjusted accordingly.  A number of studies were ordered for evaluation.  The CT scan of the head revealed no acute intracranial findings.  Specifically, there was no evidence of tumors or masses.  His chest x-ray revealed severe emphysema with no acute changes.  His TSH was within normal limits at 1.2.  His free T4 was within normal limits at 1.23.  His random cortisol level was within normal limits at 13.7.  His fasting lipid profile revealed a total cholesterol of 162, triglycerides of 44, HDL cholesterol of 76, and LDL cholesterol of 77.  His uric acid level was low at 2.2.  His spot urine sodium was 60.  His spot urine creatinine was 37.7.  His urinalysis was within normal limits.  His serum osmolality was low at 242.  His urine osmolality was low at 157.  Following these results, the IV fluids were titrated down to [facility].  He was started on Lasix at 20 mg IV daily.  Fluid restriction was initiated.  Over the course of the hospitalization, his serum sodium improved.  At the time of hospital discharge, it was 136.  The patient's serum potassium was 2.4 on admission.  He was repleted with potassium chloride orally and in the IV fluids.  His blood magnesium was assessed and it was within normal limits at 2.4.  His serum potassium was 3.5 before  discharge.  He was discharged on 7 more days of potassium chloride supplementation.  The patient was advised to continue Lasix in the outpatient setting as was prescribed prior to this hospitalization.  He was instructed to discontinue hydrochlorothiazide temporarily until his sodium and potassium levels are reassessed by either Dr. Sherwood [name] or Dr. Arrie [name]. The patient and his wife voiced understanding.  Dr. Jena [name] was consulted for the colonoscopy that had been scheduled. The patient underwent the colonoscopy on March 30, 2011.  The results were dictated above.  DISCHARGE LABORATORIES:  WBC 5.8, hemoglobin 13.6, platelet count 254. Sodium 136, potassium 3.5, chloride 100, CO2 30, glucose 93, BUN 4, creatinine less than 0.47, calcium 7.9.  [name], M.D.     DF/MEDQ  D:  03/31/2011  T:  04/01/2011  Job:  [id number]  cc:   Madelin Rear. Joseph Gambler, MD Fax: [phone]  Terrial Rhodes, M.D. Fax: [phone]  Oretha Milch, MD [address] [place] [address]  R. Roetta Sessions, MD FACP [facility] [address] [place] [address]  Electronically Signed by [name] M.D. on 04/03/2011 57:84:69 AM

## 2011-04-11 ENCOUNTER — Encounter: Payer: Self-pay | Admitting: Internal Medicine

## 2011-05-03 NOTE — Op Note (Signed)
  [name], RIDDLES            ACCOUNT NO.:  [id number]  MEDICAL RECORD NO.:  [id number]  LOCATION:  IC04                          FACILITY:  [facility]  PHYSICIAN:  R. Roetta Sessions, MD FACP FACGDATE OF BIRTH:  [birth year]  DATE OF PROCEDURE:  03/30/2011 DATE OF DISCHARGE:                              OPERATIVE REPORT   PROCEDURE PERFORMED:  Colonoscopy with biopsy.  INDICATIONS FOR PROCEDURE:  A 75 year old gentleman with multiple comorbidities, now undergoing surveillance colonoscopy given history of adenomatous polyps.  Last colonoscopy 5 years ago.  No lower GI tract symptoms previously.  We slated to do him yesterday.  He came to the hospital with mental status changes, was actually admitted with hyponatremia.  He is now much improved and felt to be stable for colonoscopy.  Colonoscopy is now being done.  Risks, benefits, limitations, alternatives, and imponderables have been discussed. Questions answered.  Please see the documentation medical record.  PROCEDURE NOTE:  O2 saturation, blood pressure, pulse, and respirations were monitored throughout the entire procedure.  Conscious sedation Versed 3 mg IV and Demerol 50 mg IV in divided doses.  INSTRUMENT:  Pentax video chip system.  FINDINGS:  Digital rectal exam revealed no abnormalities.  Endoscopic Findings:  The prep was adequate.  Colon:  Colonic mucosa was surveyed from the rectosigmoid junction through the left transverse right colon to the appendiceal orifice, ileocecal valve/cecum.  These structures were well seen, photographed for the record.  From this level, the scope was slowly and cautiously withdrawn.  All previously mentioned mucosal surfaces were again seen.  The patient had a long redundant colon requiring external abdominal pressure change because of the patient's position through its cecum.  The patient had scattered sigmoid diverticula, however, the remaining of colonic mucosa appeared normal. Scope  was pulled down the rectum.  Thorough examination of the rectal mucosa including retroflex view of the anal verge demonstrated a couple of tiny diminutive polyps, which was cold biopsy/removed.  The patient tolerated the procedure well.  Cecal withdrawal time 11 minutes.  IMPRESSION: 1. Diminutive rectal polyp status post cold biopsy removal, otherwise     normal rectum. 2. Sigmoid diverticula. 3. Long redundant, otherwise normal colon.  RECOMMENDATIONS: 1. Follow up on path. 2. Advance a renal diet. 3. Would be okay with discharge anytime from GI standpoint.  I appreciate the hospitalist to see this nice gentleman.     [name], MD FACP [facility]     RMR/MEDQ  D:  03/30/2011  T:  03/30/2011  Job:  [id number]  cc:   Madelin Rear. Sherwood Gambler, MD Fax: [phone]  Electronically Signed by Lorrin Goodell M.D. on 05/03/2011 08:40:22 AM

## 2011-06-24 ENCOUNTER — Ambulatory Visit (INDEPENDENT_AMBULATORY_CARE_PROVIDER_SITE_OTHER): Payer: Medicare Other | Admitting: Pulmonary Disease

## 2011-06-24 ENCOUNTER — Ambulatory Visit: Payer: Medicare Other | Admitting: Adult Health

## 2011-06-24 ENCOUNTER — Encounter: Payer: Self-pay | Admitting: Pulmonary Disease

## 2011-06-24 DIAGNOSIS — J4489 Other specified chronic obstructive pulmonary disease: Secondary | ICD-10-CM

## 2011-06-24 DIAGNOSIS — R05 Cough: Secondary | ICD-10-CM

## 2011-06-24 DIAGNOSIS — J449 Chronic obstructive pulmonary disease, unspecified: Secondary | ICD-10-CM

## 2011-06-24 DIAGNOSIS — R053 Chronic cough: Secondary | ICD-10-CM

## 2011-06-24 DIAGNOSIS — Z23 Encounter for immunization: Secondary | ICD-10-CM

## 2011-06-24 NOTE — Progress Notes (Signed)
  Subjective:    Patient ID: [name], male    DOB: 1936-05-08, 75 y.o.   MRN: [id number]  HPI PMD - [name]   74/M, ex- smokerfor FU of severe COPD on nocturnal O2 since 5/11 & cough.  5/10 PFTs >> severe obstruction, FEV1 39%, no BD response  He developed a chronic cough.since his heart surgery by [name] in 6/09. Cough drops & cough syrups only give temporary relief. Albuterol nebs & MDI are not very helpful. Did not feel that symbicort helped  Barrett's esophagus 3 cm noted to be stable by [name].  H/o sinus surgery, tonsillectomy  CT chest 4/09 severe emphysema, scattered sub-cm nodules stable since 2005  Hosp adm in 2/12 >> for cough syncope - admitted to tele but transferred to 2900 for another syncopal event  CT chest 11/16/10 neg PE, stable nodules, small RLL infx Rxed for HCAP with cipro & zosyn  Modified Ba swallow nml   02/08/2011  Renal managing potassium now  O2 satn 86% on walking to the office from waiting room, improved to 90% on O2. Breathing better, dry cough +  CXR -improved infiltrate. He admits to not taking his inhalers as recommended. ONO shows low O2 level for > 1 hr during sleep, stay on O2  On regimen of spiriva & albuterol   03/22/2011 - Chlorpheniramine  helped with cough Course of prednisone. Refilled cheratussin >> constipation   06/24/2011 Patient complains of wheezing for a few weeks and SOB. Also complains of feet and ankle swelling. was in ICU for  in June for  dehydration from colon cleanse. hospitalised with electrolyte depletion due to golytely ENT exam neg - hoarseness on & off   Review of Systems Patient denies significant dyspnea,cough, hemoptysis,  chest pain, palpitations, pedal edema, orthopnea, paroxysmal nocturnal dyspnea, lightheadedness, nausea, vomiting, abdominal or  leg pains      Objective:   Physical Exam Gen. Pleasant, well-nourished, in no distress ENT - no lesions, no post nasal drip Neck: No JVD, no thyromegaly, no  carotid bruits Lungs: no use of accessory muscles, no dullness to percussion, decreased without rales or rhonchi  Cardiovascular: Rhythm regular, heart sounds  normal, no murmurs or gallops, no peripheral edema Musculoskeletal: No deformities, no cyanosis or clubbing         Assessment & Plan:

## 2011-06-24 NOTE — Patient Instructions (Signed)
Flu shot  Stay on advair & spiriva

## 2011-06-25 NOTE — Assessment & Plan Note (Signed)
Stay on advair, spiriva Flu shot today Consider roflimulast in the future

## 2011-06-25 NOTE — Assessment & Plan Note (Signed)
Inhaled steroid PPI for GERD

## 2011-06-25 NOTE — Progress Notes (Signed)
Addended by: Julaine Hua on: 06/25/2011 05:58 PM   Modules accepted: Orders

## 2011-07-15 LAB — POCT I-STAT 4, (NA,K, GLUC, HGB,HCT)
Glucose, Bld: 106 — ABNORMAL HIGH
Glucose, Bld: 94
HCT: 24 — ABNORMAL LOW
HCT: 26 — ABNORMAL LOW
HCT: 41
Hemoglobin: 8.8 — ABNORMAL LOW
Operator id: 209401
Operator id: 3342
Operator id: 3342
Operator id: 3342
Potassium: 3.8
Potassium: 5.7 — ABNORMAL HIGH
Sodium: 130 — ABNORMAL LOW
Sodium: 134 — ABNORMAL LOW
Sodium: 137
Sodium: 138

## 2011-07-15 LAB — BASIC METABOLIC PANEL
BUN: 9
BUN: 7
CO2: 24
CO2: 27
CO2: 28
CO2: 28
Calcium: 7.7 — ABNORMAL LOW
Calcium: 7.7 — ABNORMAL LOW
Calcium: 7.8 — ABNORMAL LOW
Calcium: 8.4
Chloride: 104
Chloride: 109
Chloride: 95 — ABNORMAL LOW
Creatinine, Ser: 0.73
Creatinine, Ser: 0.79
Creatinine, Ser: 0.79
Creatinine, Ser: 0.72
GFR calc Af Amer: >60
GFR calc Af Amer: >60
GFR calc Af Amer: >60
GFR calc Af Amer: >60
GFR calc Af Amer: >60
GFR calc non Af Amer: >60
GFR calc non Af Amer: >60
GFR calc non Af Amer: >60
Glucose, Bld: 108 — ABNORMAL HIGH
Glucose, Bld: 130 — ABNORMAL HIGH
Glucose, Bld: 132 — ABNORMAL HIGH
Sodium: 132 — ABNORMAL LOW
Sodium: 139

## 2011-07-15 LAB — LIPID PANEL
Cholesterol: 112
LDL Cholesterol: 61
Triglycerides: 78
VLDL: 16

## 2011-07-15 LAB — POCT I-STAT 3, ART BLOOD GAS (G3+)
Acid-base deficit: 6 — ABNORMAL HIGH
Bicarbonate: 21.5
Bicarbonate: 23.9
Bicarbonate: 26.5 — ABNORMAL HIGH
O2 Saturation: 100
O2 Saturation: 94
O2 Saturation: 98
Operator id: 209401
Operator id: 3342
Patient temperature: 37
TCO2: 20
TCO2: 23
TCO2: 23
TCO2: 26
TCO2: 27
TCO2: 28
pCO2 arterial: 35.6
pCO2 arterial: 40.7
pCO2 arterial: 43.8
pCO2 arterial: 48 — ABNORMAL HIGH
pH, Arterial: 7.294 — ABNORMAL LOW
pH, Arterial: 7.32 — ABNORMAL LOW
pH, Arterial: 7.377
pH, Arterial: 7.389
pO2, Arterial: 339 — ABNORMAL HIGH
pO2, Arterial: 55 — ABNORMAL LOW
pO2, Arterial: 55 — ABNORMAL LOW

## 2011-07-15 LAB — CBC
HCT: 29.5 — ABNORMAL LOW
HCT: 33.7 — ABNORMAL LOW
Hemoglobin: 10.4 — ABNORMAL LOW
Hemoglobin: 11.1 — ABNORMAL LOW
Hemoglobin: 13.7
MCHC: 33.9
MCHC: 33.9
MCHC: 34.3
MCHC: 34.6
MCHC: 35.1
MCHC: 35.3
MCV: 95.2
MCV: 95.7
MCV: 95.7
MCV: 95.7
MCV: 96.2
Platelets: 207
Platelets: 300
RBC: 3.05 — ABNORMAL LOW
RBC: 3.06 — ABNORMAL LOW
RBC: 3.09 — ABNORMAL LOW
RBC: 3.37 — ABNORMAL LOW
RBC: 4.26
RBC: 4.28
RDW: 12.8
RDW: 12.9
RDW: 13
RDW: 13
RDW: 13.1
WBC: 22.8 — ABNORMAL HIGH
WBC: 8.3

## 2011-07-15 LAB — ABO/RH: ABO/RH(D): A NEG

## 2011-07-15 LAB — URINALYSIS, ROUTINE W REFLEX MICROSCOPIC
Glucose, UA: NEGATIVE
Ketones, ur: NEGATIVE
Protein, ur: NEGATIVE
Urobilinogen, UA: 0.2

## 2011-07-15 LAB — BLOOD GAS, ARTERIAL
Acid-Base Excess: 1.6
FIO2: 0.21
Patient temperature: 98.6
pCO2 arterial: 38.3
pH, Arterial: 7.437
pH, Arterial: 7.454 — ABNORMAL HIGH
pO2, Arterial: 63.7 — ABNORMAL LOW

## 2011-07-15 LAB — POCT I-STAT GLUCOSE: Operator id: 3342

## 2011-07-15 LAB — TSH: TSH: 2.286

## 2011-07-15 LAB — COMPREHENSIVE METABOLIC PANEL
ALT: 13
AST: 16
Alkaline Phosphatase: 59
Calcium: 8.5
GFR calc Af Amer: >60
Glucose, Bld: 94
Potassium: 3.6
Sodium: 135
Total Protein: 6

## 2011-07-15 LAB — POCT I-STAT, CHEM 8
BUN: 7
Calcium, Ion: 1.01 — ABNORMAL LOW
Chloride: 95 — ABNORMAL LOW
Creatinine, Ser: 0.6
Glucose, Bld: 161 — ABNORMAL HIGH
TCO2: 24

## 2011-07-15 LAB — PLATELET COUNT: Platelets: 219

## 2011-07-15 LAB — PROTIME-INR: INR: 1.1

## 2011-07-15 LAB — HEMOGLOBIN A1C: Hgb A1c MFr Bld: 5.6

## 2011-07-15 LAB — TYPE AND SCREEN

## 2011-07-15 LAB — CREATININE, SERUM: GFR calc Af Amer: >60

## 2011-07-15 LAB — B-NATRIURETIC PEPTIDE (CONVERTED LAB): Pro B Natriuretic peptide (BNP): 466 — ABNORMAL HIGH

## 2011-07-15 LAB — APTT: aPTT: 35

## 2011-07-16 LAB — POCT I-STAT, CHEM 8
Creatinine, Ser: 0.7
Hemoglobin: 13.9
Sodium: 132 — ABNORMAL LOW
TCO2: 28

## 2011-08-31 ENCOUNTER — Telehealth: Payer: Self-pay | Admitting: Pulmonary Disease

## 2011-08-31 ENCOUNTER — Other Ambulatory Visit: Payer: Self-pay | Admitting: Pulmonary Disease

## 2011-08-31 DIAGNOSIS — J449 Chronic obstructive pulmonary disease, unspecified: Secondary | ICD-10-CM

## 2011-08-31 MED ORDER — TIOTROPIUM BROMIDE MONOHYDRATE 18 MCG IN CAPS: 18.0000 ug | ORAL_CAPSULE | Freq: Every day | RESPIRATORY_TRACT | Status: DC

## 2011-08-31 NOTE — Telephone Encounter (Signed)
I spoke with pt wife and she states pt needs rx for [facility] [place]. I advised her will send rx for pt and nothing further was needed

## 2011-08-31 NOTE — Telephone Encounter (Signed)
lmomtcb to verify medication needed to be sent

## 2011-09-06 ENCOUNTER — Telehealth: Payer: Self-pay | Admitting: Pulmonary Disease

## 2011-09-06 MED ORDER — ALBUTEROL SULFATE (2.5 MG/3ML) 0.083% IN NEBU: 2.5000 mg | INHALATION_SOLUTION | Freq: Four times a day (QID) | RESPIRATORY_TRACT | Status: DC | PRN

## 2011-09-06 NOTE — Telephone Encounter (Signed)
Spoke with pt's wife who advised pt is in his "donut hole", Spiriva will cost over $300. Sample of Spiriva (1 box) left at front desk. And refill for albuterol neb sent to pharmacy per pt's request.

## 2011-09-06 NOTE — Telephone Encounter (Signed)
LMOMTCB 1 sample of advair has been left upfront for p/u. Rx for albuterol was already sent earlier by [facility]

## 2011-09-07 NOTE — Telephone Encounter (Signed)
LMOMTCB x 1 

## 2011-09-08 NOTE — Telephone Encounter (Signed)
lmomtcb  

## 2011-09-10 NOTE — Telephone Encounter (Signed)
lmom to make pt and wife aware that sample of advair has been left up front to be picked up.  Pt to call back for any questions or concerns

## 2011-09-20 ENCOUNTER — Telehealth: Payer: Self-pay | Admitting: Pulmonary Disease

## 2011-09-20 NOTE — Telephone Encounter (Signed)
1 sample of spiriva left for pick up. Spoke with pt and notified of this.

## 2011-09-21 ENCOUNTER — Encounter: Payer: Self-pay | Admitting: Adult Health

## 2011-09-23 ENCOUNTER — Encounter: Payer: Self-pay | Admitting: Adult Health

## 2011-09-23 ENCOUNTER — Ambulatory Visit (INDEPENDENT_AMBULATORY_CARE_PROVIDER_SITE_OTHER): Payer: Medicare Other | Admitting: Adult Health

## 2011-09-23 VITALS — BP 131/71 | HR 72 | Ht 71.0 in | Wt 137.4 lb

## 2011-09-23 DIAGNOSIS — I10 Essential (primary) hypertension: Secondary | ICD-10-CM

## 2011-09-23 DIAGNOSIS — E785 Hyperlipidemia, unspecified: Secondary | ICD-10-CM

## 2011-09-23 DIAGNOSIS — I2510 Atherosclerotic heart disease of native coronary artery without angina pectoris: Secondary | ICD-10-CM

## 2011-09-23 NOTE — Patient Instructions (Signed)
**Note De-identified  Obfuscation** Your physician recommends that you continue on your current medications as directed. Please refer to the Current Medication list given to you today.  Your physician recommends that you schedule a follow-up appointment in: 6 months  

## 2011-09-23 NOTE — Assessment & Plan Note (Signed)
He is stable at this time from cardiac standpoint and asymptomatic.  His symptoms are related primarily to his lung disease and concerns about weight loss.  I have not seen any cardiac contraindications for the use of Daliresp at this time. However, use of Marinol for appetite stimulation is not recommended in patients with heart disease. Would recommed Megace instead if appetite stimulant is needed. He will follow-up with Dr. Sherwood Gambler for further recommendations for this.  At this time he will continue on current medication regimen.  Will see him in 6 months unless he is having symptoms.

## 2011-09-23 NOTE — Assessment & Plan Note (Signed)
Review of recent labs demonstrateTC 162, HDL 76, LDL77, TG 44.  He will continue on current dose of pravastatin.

## 2011-09-23 NOTE — Progress Notes (Signed)
HPI: [name] is a 75 y/o patient of Dr.Rothbart we are seeing for ongoing assessment and treatment of CAD with CABG in 2009, COPD with Class II dyspnea on exertion. He has been recently placed on a new medication per Dr.[name]-Dalirsep (used for relief of bronchospasm. It is thought to inhibit metabolizing enzymes in the lung tissue leading to the accumulation of intracellular cyclic AMP in the lung cells). He had tolerated this well with the exception of weight loss side effects.  He has been placed on an every other day dosing from the recommended daily dose.  He states he cannot tell if this has helped him yet. He denies chest pain or dizziness. He is limited in his activity secondary to lung disease.  No Known Allergies  Current Outpatient Prescriptions  Medication Sig Dispense Refill  . albuterol (PROAIR HFA) 108 (90 BASE) MCG/ACT inhaler Inhale 2 puffs into the lungs every 4 (four) hours as needed.        Marland Kitchen albuterol (PROVENTIL) (2.5 MG/3ML) 0.083% nebulizer solution Take 3 mLs (2.5 mg total) by nebulization every 6 (six) hours as needed.  120 mL  3  . albuterol (PROVENTIL) 2 MG tablet Take 2 mg by mouth daily.        Marland Kitchen amLODipine (NORVASC) 10 MG tablet Take 10 mg by mouth daily.       Marland Kitchen aspirin 81 MG tablet Take 81 mg by mouth daily.        . Docusate Calcium (CVS STOOL SOFTENER PO) Take by mouth every evening.        . Fluticasone-Salmeterol (ADVAIR DISKUS) 250-50 MCG/DOSE AEPB Inhale 1 puff into the lungs 2 (two) times daily.  60 each  3  . furosemide (LASIX) 20 MG tablet Take 1 tablet by mouth Once daily as needed.      . Omega-3 Fatty Acids (EQL FISH OIL) 1000 MG CAPS Take 2 capsules (2,000 mg total) by mouth daily.  60 capsule  3  . omeprazole (PRILOSEC) 20 MG capsule Take 20 mg by mouth daily. 30 minutes before breakfast       . polyethylene glycol powder (CVS PURELAX) powder Take 17 g by mouth daily.        . pravastatin (PRAVACHOL) 80 MG tablet Take 1 tablet (80 mg total) by  mouth daily.  30 tablet  3  . tiotropium (SPIRIVA) 18 MCG inhalation capsule Place 1 capsule (18 mcg total) into inhaler and inhale daily.  90 capsule  3  . DALIRESP 500 MCG TABS tablet       . dronabinol (MARINOL) 2.5 MG capsule       . HYDROcodone-acetaminophen (NORCO) 10-325 MG per tablet         Past Medical History  Diagnosis Date  . Hypertension   . Hyperlipidemia     excellent control  . Chronic kidney disease, stage III (moderate)   . Barrett's esophagus     frequent surveillance EGDs by Dr. Jena Gauss  . Chronic obstructive pulmonary disease     severe emphysema  . Cough, persistent   . Hyperkalemia     peak of 6.2 in 2009  . Colonic polyp     resected 2007 via colonoscope  . Diverticulosis   . Pneumonia     10/2010  . Coronary artery disease     CABG in 03/2008 for severe left main disease with critical RCA stenosis; EF of 45-50% with inferior wall hypokinesis; echocardiogram in 10/2008-no change in LV function; stress nuclear-conflicting information  in report indicating inferior reversibility but inferior scar  . Gastroesophageal reflux disease   . Peptic ulcer disease     perforation and surgical repair at [facility] in 1999; treated for positive Helicobacter pylori  . History of orthostatic hypotension     noted following CABG surgery    Past Surgical History  Procedure Date  . Laparoscopic gastrotomy w/ repair of ulcer     1999  . Coronary artery bypass graft 03/2008  . Colon surgery     ZOX:WRUEAV of systems complete and found to be negative unless listed above PHYSICAL EXAM BP 131/71  Pulse 72  Ht 5\' 11"  (1.803 m)  Wt 137 lb 6.4 oz (62.324 kg)  BMI 19.16 kg/m2  SpO2 93%  EKG:NSR rate of 70 bpm. Inferior ST abnormalities with IC RBBB.   ASSESSMENT AND PLAN

## 2011-09-23 NOTE — Assessment & Plan Note (Signed)
Currently well controlled. No changes to medications at this time.

## 2011-10-14 ENCOUNTER — Telehealth: Payer: Self-pay | Admitting: Pulmonary Disease

## 2011-10-14 NOTE — Telephone Encounter (Signed)
Contacted patient, patient is aware that samples are ready to be picked up.

## 2011-10-27 DIAGNOSIS — D509 Iron deficiency anemia, unspecified: Secondary | ICD-10-CM | POA: Diagnosis not present

## 2011-11-04 ENCOUNTER — Encounter: Payer: Self-pay | Admitting: Adult Health

## 2011-11-04 ENCOUNTER — Ambulatory Visit (INDEPENDENT_AMBULATORY_CARE_PROVIDER_SITE_OTHER): Payer: Medicare Other | Admitting: Adult Health

## 2011-11-04 VITALS — BP 118/62 | HR 80 | Temp 96.7°F | Ht 71.0 in | Wt 144.2 lb

## 2011-11-04 DIAGNOSIS — J449 Chronic obstructive pulmonary disease, unspecified: Secondary | ICD-10-CM

## 2011-11-04 MED ORDER — TIOTROPIUM BROMIDE MONOHYDRATE 18 MCG IN CAPS: 18.0000 ug | ORAL_CAPSULE | Freq: Every day | RESPIRATORY_TRACT | Status: DC

## 2011-11-04 MED ORDER — FLUTICASONE-SALMETEROL 250-50 MCG/DOSE IN AEPB: 1.0000 | INHALATION_SPRAY | Freq: Every day | RESPIRATORY_TRACT | Status: DC

## 2011-11-04 NOTE — Patient Instructions (Signed)
Continue on Advair 250/50 twice daily  Keep taking spiriva daily  Continue on Daliresp daily  follow up Dr. Vassie Loll  In 4 months and As needed

## 2011-11-04 NOTE — Progress Notes (Signed)
Addended by: Boone Master E on: 11/04/2011 03:01 PM   Modules accepted: Orders

## 2011-11-04 NOTE — Progress Notes (Signed)
Subjective:    Patient ID: [name], male    DOB: 1936-01-21, 76 y.o.   MRN: [id number]  HPI  PMD - [name]   76/M, ex- smokerfor FU of severe COPD on nocturnal O2 since 5/11 & cough.  5/10 PFTs >> severe obstruction, FEV1 39%, no BD response  He developed a chronic cough.since his heart surgery by [name] in 6/09. Cough drops & cough syrups only give temporary relief. Albuterol nebs & MDI are not very helpful. Did not feel that symbicort helped  Barrett's esophagus 3 cm noted to be stable by [name].  H/o sinus surgery, tonsillectomy  CT chest 4/09 severe emphysema, scattered sub-cm nodules stable since 2005  Hosp adm in 2/12 >> for cough syncope - admitted to tele but transferred to 2900 for another syncopal event  CT chest 11/16/10 neg PE, stable nodules, small RLL infx Rxed for HCAP with cipro & zosyn  Modified Ba swallow nml   02/08/2011  Renal managing potassium now  O2 satn 86% on walking to the office from waiting room, improved to 90% on O2. Breathing better, dry cough +  CXR -improved infiltrate. He admits to not taking his inhalers as recommended. ONO shows low O2 level for > 1 hr during sleep, stay on O2  On regimen of spiriva & albuterol   03/22/2011 - Chlorpheniramine  helped with cough Course of prednisone. Refilled cheratussin >> constipation   06/24/11  Patient complains of wheezing for a few weeks and SOB. Also complains of feet and ankle swelling. was in ICU for  in June for  dehydration from colon cleanse. hospitalised with electrolyte depletion due to golytely ENT exam neg - hoarseness on & off >>No changes   11/04/2011 Follow up  Returns for follow up . Doing well since last ov. Started on Daliresp by Primary MD -takes every other day . Tolerating inhalers. No ER or hospital visits since last ov.  No increased SABA use.   No chest pain or edema.   Review of Systems  Constitutional:   No  weight loss, night sweats,  Fevers, chills,  +fatigue, or   lassitude.  HEENT:   No headaches,  Difficulty swallowing,  Tooth/dental problems, or  Sore throat,                No sneezing, itching, ear ache,    CV:  No chest pain,  Orthopnea, PND, swelling in lower extremities, anasarca, dizziness, palpitations, syncope.   GI  No heartburn, indigestion, abdominal pain, nausea, vomiting, diarrhea, change in bowel habits, loss of appetite, bloody stools.   Resp:   No excess mucus, no productive cough,  No non-productive cough,  No coughing up of blood.  No change in color of mucus.  No wheezing.  No chest wall deformity  Skin: no rash or lesions.  GU: no dysuria, change in color of urine, no urgency or frequency.  No flank pain, no hematuria   MS:  No joint pain or swelling.  No decreased range of motion.  No back pain.  Psych:  No change in mood or affect. No depression or anxiety.  No memory loss.          Objective:   Physical Exam  Gen. Pleasant, well-nourished, in no distress ENT - no lesions, no post nasal drip Neck: No JVD, no thyromegaly, no carotid bruits Lungs: no use of accessory muscles, no dullness to percussion, decreased without rales or rhonchi  Cardiovascular: Rhythm regular, heart sounds  normal, no murmurs or gallops, no peripheral edema Musculoskeletal: No deformities, no cyanosis or clubbing         Assessment & Plan:

## 2011-11-04 NOTE — Assessment & Plan Note (Signed)
Compensated on present regimen  No changes  

## 2011-11-29 ENCOUNTER — Other Ambulatory Visit: Payer: Self-pay | Admitting: Cardiology

## 2012-01-27 DIAGNOSIS — D509 Iron deficiency anemia, unspecified: Secondary | ICD-10-CM | POA: Diagnosis not present

## 2012-01-27 DIAGNOSIS — I10 Essential (primary) hypertension: Secondary | ICD-10-CM | POA: Diagnosis not present

## 2012-01-31 ENCOUNTER — Encounter (HOSPITAL_COMMUNITY): Payer: Self-pay | Admitting: *Deleted

## 2012-01-31 ENCOUNTER — Other Ambulatory Visit: Payer: Self-pay

## 2012-01-31 ENCOUNTER — Emergency Department (HOSPITAL_COMMUNITY)
Admission: EM | Admit: 2012-01-31 | Discharge: 2012-01-31 | Disposition: A | Discharge status: Home / Self Care | Payer: Medicare Other | Attending: Emergency Medicine | Admitting: Emergency Medicine

## 2012-01-31 ENCOUNTER — Emergency Department (HOSPITAL_COMMUNITY): Payer: Medicare Other

## 2012-01-31 DIAGNOSIS — J439 Emphysema, unspecified: Secondary | ICD-10-CM | POA: Diagnosis not present

## 2012-01-31 DIAGNOSIS — R002 Palpitations: Secondary | ICD-10-CM | POA: Diagnosis not present

## 2012-01-31 DIAGNOSIS — K219 Gastro-esophageal reflux disease without esophagitis: Secondary | ICD-10-CM | POA: Diagnosis not present

## 2012-01-31 DIAGNOSIS — R630 Anorexia: Secondary | ICD-10-CM | POA: Diagnosis not present

## 2012-01-31 DIAGNOSIS — N183 Chronic kidney disease, stage 3 unspecified: Secondary | ICD-10-CM | POA: Diagnosis not present

## 2012-01-31 DIAGNOSIS — J4489 Other specified chronic obstructive pulmonary disease: Secondary | ICD-10-CM | POA: Insufficient documentation

## 2012-01-31 DIAGNOSIS — Z79899 Other long term (current) drug therapy: Secondary | ICD-10-CM | POA: Diagnosis not present

## 2012-01-31 DIAGNOSIS — I129 Hypertensive chronic kidney disease with stage 1 through stage 4 chronic kidney disease, or unspecified chronic kidney disease: Secondary | ICD-10-CM | POA: Insufficient documentation

## 2012-01-31 DIAGNOSIS — Z7982 Long term (current) use of aspirin: Secondary | ICD-10-CM | POA: Diagnosis not present

## 2012-01-31 DIAGNOSIS — J449 Chronic obstructive pulmonary disease, unspecified: Secondary | ICD-10-CM | POA: Insufficient documentation

## 2012-01-31 DIAGNOSIS — K2270 Barrett's esophagus without dysplasia: Secondary | ICD-10-CM | POA: Insufficient documentation

## 2012-01-31 DIAGNOSIS — E785 Hyperlipidemia, unspecified: Secondary | ICD-10-CM | POA: Diagnosis not present

## 2012-01-31 DIAGNOSIS — I2510 Atherosclerotic heart disease of native coronary artery without angina pectoris: Secondary | ICD-10-CM | POA: Diagnosis not present

## 2012-01-31 DIAGNOSIS — I498 Other specified cardiac arrhythmias: Secondary | ICD-10-CM | POA: Insufficient documentation

## 2012-01-31 LAB — POCT I-STAT, CHEM 8
BUN: 14 mg/dL (ref 6–23)
Calcium, Ion: 1.12 mmol/L (ref 1.12–1.32)
Chloride: 105 mEq/L (ref 96–112)
Creatinine, Ser: 0.8 mg/dL (ref 0.50–1.35)
Potassium: 4.4 mEq/L (ref 3.5–5.1)
Sodium: 141 mEq/L (ref 135–145)
TCO2: 25 mmol/L (ref 0–100)

## 2012-01-31 NOTE — ED Notes (Signed)
Irregular heart beat today.  No chest pain

## 2012-01-31 NOTE — Discharge Instructions (Signed)
Please return if worse at any time.  Your potassium today is 4.4  .

## 2012-01-31 NOTE — ED Notes (Signed)
Pt brought to er by family member, has not been feeling well for 2 days, has home bp/pulse monitor and has been reading irregular, also has headache and wants to re-assure self and family member that he is ok, denies any n/v/d, no cough or fever, denies cp, alert and able to answer all ?'s

## 2012-01-31 NOTE — ED Provider Notes (Signed)
History    This chart was scribed for Hilario Quarry, MD, MD by Smitty Pluck. The patient was seen in room APA01 and the patient's care was started at 4:22PM.   CSN: [id number]  Arrival date & time 01/31/12  1605   First MD Initiated Contact with Patient 01/31/12 1620      Chief Complaint  Patient presents with  . Irregular Heart Beat    (Consider location/radiation/quality/duration/timing/severity/associated sxs/prior treatment) The history is provided by the patient.   [name] is a 76 y.o. male who presents to the Emergency Department complaining of irregular heart beat onset today. Pt reports waking up with the irregular heart beat. Pt reports symptoms were constant without radiation. Pt has COPD and stage 3 kidney failure. He has open heart surgery and lesion in heart was fixed (1999). Pt reports that he was checking his blood pressure using machine and the machine told him. He states that "he does not feel good" and "it feels like it is in his head." He uses 3 L of oxygen at home. His potassium level was 5.2. He has decreased appetite.  PCP is Dr. Sherwood Gambler  Past Medical History  Diagnosis Date  . Hypertension   . Hyperlipidemia     excellent control  . Chronic kidney disease, stage III (moderate)   . Barrett's esophagus     frequent surveillance EGDs by Dr. Jena Gauss  . Chronic obstructive pulmonary disease     severe emphysema  . Cough, persistent   . Hyperkalemia     peak of 6.2 in 2009  . Colonic polyp     resected 2007 via colonoscope  . Diverticulosis   . Pneumonia     10/2010  . Coronary artery disease     CABG in 03/2008 for severe left main disease with critical RCA stenosis; EF of 45-50% with inferior wall hypokinesis; echocardiogram in 10/2008-no change in LV function; stress nuclear-conflicting information in report indicating inferior reversibility but inferior scar  . Gastroesophageal reflux disease   . Peptic ulcer disease     perforation and surgical  repair at [facility] in 1999; treated for positive Helicobacter pylori  . History of orthostatic hypotension     noted following CABG surgery  . COPD (chronic obstructive pulmonary disease)     Past Surgical History  Procedure Date  . Laparoscopic gastrotomy w/ repair of ulcer     1999  . Coronary artery bypass graft 03/2008  . Colon surgery   . Cardiac surgery     Family History  Problem Relation Age of Onset  . Heart disease Mother   . Heart disease Father     History  Substance Use Topics  . Smoking status: Former Smoker -- 1.0 packs/day for 20 years    Types: Cigarettes, Pipe, Cigars    Quit date: 01/12/1998  . Smokeless tobacco: Never Used  . Alcohol Use: Yes     rare wine      Review of Systems  All other systems reviewed and are negative.    Allergies  Review of patient's allergies indicates no known allergies.  Home Medications   Current Outpatient Rx  Name Route Sig Dispense Refill  . ALBUTEROL SULFATE HFA 108 (90 BASE) MCG/ACT IN AERS Inhalation Inhale 2 puffs into the lungs every 4 (four) hours as needed.      . ALBUTEROL SULFATE (2.5 MG/3ML) 0.083% IN NEBU Nebulization Take 3 mLs (2.5 mg total) by nebulization every 6 (six) hours as needed. 120 mL  3  . ALBUTEROL SULFATE 2 MG PO TABS Oral Take 2 mg by mouth daily.      Marland Kitchen AMLODIPINE BESYLATE 10 MG PO TABS Oral Take 10 mg by mouth daily.     . ASPIRIN 81 MG PO TABS Oral Take 81 mg by mouth daily.      Marland Kitchen DALIRESP 500 MCG PO TABS  500 mcg daily.     . CVS STOOL SOFTENER PO Oral Take by mouth every evening.      Marland Kitchen FLUTICASONE-SALMETEROL 250-50 MCG/DOSE IN AEPB Inhalation Inhale 1 puff into the lungs daily. 60 each 5  . FUROSEMIDE 20 MG PO TABS Oral Take 1 tablet by mouth Once daily as needed.    Marland Kitchen HYDROCODONE-ACETAMINOPHEN 10-325 MG PO TABS      . EQL FISH OIL 1000 MG PO CAPS Oral Take 2 capsules (2,000 mg total) by mouth daily. 60 capsule 3  . OMEPRAZOLE 20 MG PO CPDR Oral Take 20 mg by mouth daily. 30  minutes before breakfast     . POLYETHYLENE GLYCOL 3350 PO POWD Oral Take 17 g by mouth daily.      Marland Kitchen PRAVASTATIN SODIUM 80 MG PO TABS  TAKE 1 TABLET BY MOUTH AT BEDTIME 30 tablet 4  . TIOTROPIUM BROMIDE MONOHYDRATE 18 MCG IN CAPS Inhalation Place 1 capsule (18 mcg total) into inhaler and inhale daily. 30 capsule 5    BP 132/65  Pulse 65  Temp(Src) 97.6 F (36.4 C) (Oral)  Resp 20  Ht 5\' 11"  (1.803 m)  Wt 138 lb (62.596 kg)  BMI 19.25 kg/m2  SpO2 95%  Physical Exam  Nursing note and vitals reviewed. Constitutional: He is oriented to person, place, and time. He appears well-developed and well-nourished. No distress.  HENT:  Head: Normocephalic and atraumatic.  Eyes: Conjunctivae are normal. Pupils are equal, round, and reactive to light.  Neck: Normal range of motion. Neck supple.  Cardiovascular: Normal rate, regular rhythm and normal heart sounds.   Pulmonary/Chest: Effort normal. No respiratory distress.  Neurological: He is alert and oriented to person, place, and time.  Skin: Skin is warm and dry.  Psychiatric: He has a normal mood and affect. His behavior is normal.    ED Course  Procedures (including critical care time) DIAGNOSTIC STUDIES: Oxygen Saturation is 95% on room air, normal by my interpretation.    COORDINATION OF CARE: 4:30PM EDP discusses pt ED treatment course with pt.    Labs Reviewed - No data to display Dg Chest 2 View  01/31/2012  *RADIOLOGY REPORT*  Clinical Data: Irregular heart beat.  COPD.  CHEST - 2 VIEW  Comparison: CT chest 11/19/2010 and plain film chest 03/29/2011.  Findings: Changes of severe bullous emphysema are again seen.  No focal airspace disease, pneumothorax or pleural effusion.  Heart size normal.  The patient is status post CABG.  IMPRESSION: Severe emphysema.  No acute finding.  Original Report Authenticated By: Bernadene Bell. Maricela Curet, M.D.     No diagnosis found.    MDM  i stat done with potassium 4.4 and creatinine 0.8 and  norrmal troponin. ekg normal here and cxr without acute abnormality.  Patient and wife advised of findings.  Symptoms present only because bp machine stated heart beat irregular.  No evidence of afib or arrhythmia.   Hilario Quarry, MD 01/31/12 [phone]

## 2012-02-01 LAB — POCT I-STAT, CHEM 8
BUN: 14 mg/dL (ref 6–23)
Creatinine, Ser: 0.9 mg/dL (ref 0.50–1.35)
Glucose, Bld: 115 mg/dL — ABNORMAL HIGH (ref 70–99)
Potassium: 4.5 mEq/L (ref 3.5–5.1)
Sodium: 141 mEq/L (ref 135–145)

## 2012-02-01 LAB — POCT I-STAT TROPONIN I

## 2012-02-02 ENCOUNTER — Encounter: Payer: Self-pay | Admitting: Pulmonary Disease

## 2012-02-02 ENCOUNTER — Ambulatory Visit (INDEPENDENT_AMBULATORY_CARE_PROVIDER_SITE_OTHER): Payer: Medicare Other | Admitting: Pulmonary Disease

## 2012-02-02 VITALS — BP 120/62 | HR 65 | Temp 96.7°F | Ht 71.0 in | Wt 143.2 lb

## 2012-02-02 DIAGNOSIS — J449 Chronic obstructive pulmonary disease, unspecified: Secondary | ICD-10-CM | POA: Diagnosis not present

## 2012-02-02 DIAGNOSIS — R002 Palpitations: Secondary | ICD-10-CM

## 2012-02-02 DIAGNOSIS — I471 Supraventricular tachycardia: Secondary | ICD-10-CM | POA: Insufficient documentation

## 2012-02-02 MED ORDER — AZITHROMYCIN 250 MG PO TABS: ORAL_TABLET | ORAL | Status: AC

## 2012-02-02 MED ORDER — PREDNISONE 10 MG PO TABS: ORAL_TABLET | ORAL | Status: DC

## 2012-02-02 NOTE — Progress Notes (Signed)
  Subjective:    Patient ID: [name], male    DOB: January 28, 1936, 76 y.o.   MRN: [id number]  HPI  PMD - [name]   75/M, ex- smokerfor FU of severe COPD on nocturnal O2 since 5/11 & cough.  5/10 PFTs >> severe obstruction, FEV1 39%, no BD response  He developed a chronic cough.since his heart surgery by [name] in 6/09. Cough drops & cough syrups only give temporary relief. Albuterol nebs & MDI are not very helpful. Did not feel that symbicort helped  Barrett's esophagus 3 cm noted to be stable by [name].  H/o sinus surgery, tonsillectomy  CT chest 4/09 severe emphysema, scattered sub-cm nodules stable since 2005  Hosp adm in 2/12 >> for cough syncope - admitted to tele but transferred to 2900 for another syncopal event  CT chest 11/16/10 neg PE, stable nodules, small RLL infx Rxed for HCAP with cipro & zosyn  Modified Ba swallow nml    11/04/2011  Started on Daliresp by Primary MD -takes every other day .   02/02/2012 Pt went to ED 01/31/12 for palpitations--everything check out fine. Pt c/o increase SOB w/ activity, occasional wheezing, occasional cough w. grey to white phlem Not using too much albuterol Taking delsym every day instead of mucinex (constipation)   Review of Systems Patient denies significant dyspnea,cough, hemoptysis,  chest pain, palpitations, pedal edema, orthopnea, paroxysmal nocturnal dyspnea, lightheadedness, nausea, vomiting, abdominal or  leg pains      Objective:   Physical Exam  Gen. Pleasant, well-nourished, in no distress ENT - no lesions, no post nasal drip Neck: No JVD, no thyromegaly, no carotid bruits Lungs: no use of accessory muscles, no dullness to percussion, decreased without rales or rhonchi  Cardiovascular: Rhythm regular, heart sounds  normal, no murmurs or gallops, no peripheral edema Musculoskeletal: No deformities, no cyanosis or clubbing        Assessment & Plan:

## 2012-02-02 NOTE — Patient Instructions (Signed)
Z-pak Prednisone 20 mg x 5 days, then 10 mg x 5 days, then STOP Holter monitor for palpitations Take DELSYm as needed only

## 2012-02-05 NOTE — Assessment & Plan Note (Signed)
Holter monitor for palpitations

## 2012-02-05 NOTE — Assessment & Plan Note (Signed)
Treat as flare Z-pak Prednisone 20 mg x 5 days, then 10 mg x 5 days, then STOP Take DELSYm as needed only

## 2012-02-07 ENCOUNTER — Telehealth: Payer: Self-pay | Admitting: Pulmonary Disease

## 2012-02-07 DIAGNOSIS — J449 Chronic obstructive pulmonary disease, unspecified: Secondary | ICD-10-CM

## 2012-02-07 DIAGNOSIS — J4489 Other specified chronic obstructive pulmonary disease: Secondary | ICD-10-CM

## 2012-02-07 NOTE — Telephone Encounter (Signed)
Pt wife called and stated that she is receiving a letter from [facility] stating the pt needs to be re- certified for her oxygen. Pt is on nocturnal oxygen so an ONO will need to be ordered. Please advise if ok to order. Thanks.Carron Curie, CMA

## 2012-02-07 NOTE — Telephone Encounter (Signed)
Ok to order ONO on RA

## 2012-02-07 NOTE — Telephone Encounter (Signed)
I spoke with the pt and they were contacted by rose today. Nothing further needed.Carron Curie, CMA

## 2012-02-08 NOTE — Telephone Encounter (Signed)
Order has been placed for ono on RA.--[id number] x1

## 2012-02-08 NOTE — Telephone Encounter (Signed)
i spoke with spouse and is aware order has been placed. Nothing further was needed

## 2012-02-08 NOTE — Telephone Encounter (Signed)
Patient's wife returning call. 

## 2012-02-11 ENCOUNTER — Encounter (INDEPENDENT_AMBULATORY_CARE_PROVIDER_SITE_OTHER): Payer: Medicare Other

## 2012-02-11 DIAGNOSIS — R002 Palpitations: Secondary | ICD-10-CM | POA: Diagnosis not present

## 2012-02-24 ENCOUNTER — Telehealth: Payer: Self-pay | Admitting: Pulmonary Disease

## 2012-02-24 NOTE — Telephone Encounter (Signed)
ono on RA showed only 2 min desatn < 88% OK to stay off O2 x 1 mth & report back

## 2012-02-24 NOTE — Telephone Encounter (Signed)
I spoke with tomi and she she is requesting pt's ONO be faxed over to her. I advised her RA currently has this with him but will return tomorrow and will fax over once he comes in. Will hold in my box as a reminder to do so.

## 2012-02-24 NOTE — Telephone Encounter (Signed)
I spoke with spouse and is aware of results. She voice her understanding and will let pt know

## 2012-02-25 ENCOUNTER — Ambulatory Visit: Payer: Medicare Other | Admitting: Adult Health

## 2012-02-25 ENCOUNTER — Encounter: Payer: Self-pay | Admitting: Cardiology

## 2012-02-25 ENCOUNTER — Ambulatory Visit (INDEPENDENT_AMBULATORY_CARE_PROVIDER_SITE_OTHER): Payer: Medicare Other | Admitting: Cardiology

## 2012-02-25 VITALS — BP 126/62 | HR 77 | Resp 18 | Ht 71.0 in | Wt 144.0 lb

## 2012-02-25 DIAGNOSIS — R002 Palpitations: Secondary | ICD-10-CM

## 2012-02-25 DIAGNOSIS — I2510 Atherosclerotic heart disease of native coronary artery without angina pectoris: Secondary | ICD-10-CM | POA: Diagnosis not present

## 2012-02-25 DIAGNOSIS — J449 Chronic obstructive pulmonary disease, unspecified: Secondary | ICD-10-CM

## 2012-02-25 DIAGNOSIS — I10 Essential (primary) hypertension: Secondary | ICD-10-CM

## 2012-02-25 DIAGNOSIS — J4489 Other specified chronic obstructive pulmonary disease: Secondary | ICD-10-CM

## 2012-02-25 DIAGNOSIS — E785 Hyperlipidemia, unspecified: Secondary | ICD-10-CM

## 2012-02-25 NOTE — Telephone Encounter (Signed)
I have faxed  Results over to tomi. Will sing off message

## 2012-02-25 NOTE — Progress Notes (Deleted)
Name: [name]    DOB: 29-May-1936  Age: 76 y.o.  MR#: [id number]       PCP:  Cassell Smiles., MD, MD      Insurance: @PAYORNAME @   CC:    Chief Complaint  Patient presents with  . Appointment    pt c/o  increased HR +med list    VS Resp 18  Ht 5\' 11"  (1.803 m)  Wt 144 lb (65.318 kg)  BMI 20.08 kg/m2  Weights Current Weight  02/25/12 144 lb (65.318 kg)  02/02/12 143 lb 3.2 oz (64.955 kg)  01/31/12 138 lb (62.596 kg)    Blood Pressure  BP Readings from Last 3 Encounters:  02/02/12 120/62  01/31/12 122/63  11/04/11 118/62     Admit date:  (Not on file) Last encounter with RMR:  11/29/2011   Allergy No Known Allergies  Current Outpatient Prescriptions  Medication Sig Dispense Refill  . albuterol (PROAIR HFA) 108 (90 BASE) MCG/ACT inhaler Inhale 2 puffs into the lungs every 4 (four) hours as needed.        Marland Kitchen albuterol (PROVENTIL) (2.5 MG/3ML) 0.083% nebulizer solution Take 3 mLs (2.5 mg total) by nebulization every 6 (six) hours as needed.  120 mL  3  . albuterol (PROVENTIL) 2 MG tablet Take 2 mg by mouth daily.        Marland Kitchen amLODipine (NORVASC) 10 MG tablet Take 10 mg by mouth daily.       Marland Kitchen aspirin 81 MG tablet Take 81 mg by mouth daily.        Marland Kitchen DALIRESP 500 MCG TABS tablet 500 mcg daily.       . Docusate Calcium ([facility] STOOL SOFTENER PO) Take by mouth every evening.        . Fluticasone-Salmeterol (ADVAIR) 250-50 MCG/DOSE AEPB Inhale 1 puff into the lungs daily.  60 each  5  . furosemide (LASIX) 20 MG tablet Take 1 tablet by mouth Once daily as needed.      Marland Kitchen HYDROcodone-acetaminophen (NORCO) 10-325 MG per tablet as needed.       . Omega-3 Fatty Acids (EQL FISH OIL) 1000 MG CAPS Take 2 capsules (2,000 mg total) by mouth daily.  60 capsule  3  . omeprazole (PRILOSEC) 20 MG capsule Take 20 mg by mouth daily. 30 minutes before breakfast       . polyethylene glycol powder ([facility] PURELAX) powder Take 17 g by mouth daily.        . pravastatin (PRAVACHOL) 80 MG tablet TAKE  1 TABLET BY MOUTH AT BEDTIME  30 tablet  4  . tiotropium (SPIRIVA) 18 MCG inhalation capsule Place 1 capsule (18 mcg total) into inhaler and inhale daily.  30 capsule  5    Discontinued Meds:    Medications Discontinued During This Encounter  Medication Reason  . predniSONE (DELTASONE) 10 MG tablet Error    Patient Active Problem List  Diagnoses  . HYPERLIPIDEMIA  . HYPERTENSION  . C O P D  . Chronic cough  . Coronary artery disease  . Hx of adenomatous colonic polyps  . Heart palpitations    LABS Admission on 01/31/2012, Discharged on 01/31/2012  Component Date Value  . Sodium 01/31/2012 141   . Potassium 01/31/2012 4.4   . Chloride 01/31/2012 105   . BUN 01/31/2012 14   . Creatinine, Ser 01/31/2012 0.80   . Glucose, Bld 01/31/2012 112*  . Calcium, Ion 01/31/2012 1.12   . TCO2 01/31/2012 25   . Hemoglobin  01/31/2012 16.3   . HCT 01/31/2012 48.0   . Sodium 01/31/2012 141   . Potassium 01/31/2012 4.5   . Chloride 01/31/2012 106   . BUN 01/31/2012 14   . Creatinine, Ser 01/31/2012 0.90   . Glucose, Bld 01/31/2012 115*  . Calcium, Ion 01/31/2012 1.12   . TCO2 01/31/2012 26   . Hemoglobin 01/31/2012 16.7   . HCT 01/31/2012 49.0   . Troponin i, poc 01/31/2012 0.00   . Comment 3 01/31/2012             Results for this Opt Visit:     Results for orders placed during the hospital encounter of 01/31/12  POCT I-STAT, CHEM 8      Component Value Range   Sodium 141  135 - 145 (mEq/L)   Potassium 4.4  3.5 - 5.1 (mEq/L)   Chloride 105  96 - 112 (mEq/L)   BUN 14  6 - 23 (mg/dL)   Creatinine, Ser 1.47  0.50 - 1.35 (mg/dL)   Glucose, Bld 829 (*) 70 - 99 (mg/dL)   Calcium, Ion 5.62  1.30 - 1.32 (mmol/L)   TCO2 25  0 - 100 (mmol/L)   Hemoglobin 16.3  13.0 - 17.0 (g/dL)   HCT 86.5  78.4 - 69.6 (%)  POCT I-STAT, CHEM 8      Component Value Range   Sodium 141  135 - 145 (mEq/L)   Potassium 4.5  3.5 - 5.1 (mEq/L)   Chloride 106  96 - 112 (mEq/L)   BUN 14  6 - 23 (mg/dL)     Creatinine, Ser 2.95  0.50 - 1.35 (mg/dL)   Glucose, Bld 284 (*) 70 - 99 (mg/dL)   Calcium, Ion 1.32  4.40 - 1.32 (mmol/L)   TCO2 26  0 - 100 (mmol/L)   Hemoglobin 16.7  13.0 - 17.0 (g/dL)   HCT 10.2  72.5 - 36.6 (%)  POCT I-STAT TROPONIN I      Component Value Range   Troponin i, poc 0.00  0.00 - 0.08 (ng/mL)   Comment 3             EKG Orders placed in visit on 02/25/12  . EKG 12-LEAD     Prior Assessment and Plan Problem List as of 02/25/2012          Cardiology Problems   HYPERLIPIDEMIA   Last Assessment & Plan Note   09/23/2011 Office Visit Signed 09/23/2011  2:18 PM by Jodelle Gross, NP    Review of recent labs demonstrateTC 162, HDL 76, LDL77, TG 44.  He will continue on current dose of pravastatin.    HYPERTENSION   Last Assessment & Plan Note   09/23/2011 Office Visit Signed 09/23/2011  2:17 PM by Jodelle Gross, NP    Currently well controlled. No changes to medications at this time.    Coronary artery disease   Last Assessment & Plan Note   09/23/2011 Office Visit Signed 09/23/2011  2:17 PM by Jodelle Gross, NP    He is stable at this time from cardiac standpoint and asymptomatic.  His symptoms are related primarily to his lung disease and concerns about weight loss.  I have not seen any cardiac contraindications for the use of Daliresp at this time. However, use of Marinol for appetite stimulation is not recommended in patients with heart disease. Would recommed Megace instead if appetite stimulant is needed. He will follow-up with Dr. Sherwood Gambler for further recommendations for this.  At this time he will continue on current medication regimen.  Will see him in 6 months unless he is having symptoms.       Other   C Erskin Burnet D   Last Assessment & Plan Note   02/02/2012 Office Visit Signed 02/05/2012  9:26 PM by Oretha Milch, MD    Treat as flare Z-pak Prednisone 20 mg x 5 days, then 10 mg x 5 days, then STOP Take DELSYm as needed only    Chronic cough   Last  Assessment & Plan Note   06/24/2011 Office Visit Signed 06/25/2011  7:03 AM by Oretha Milch, MD    Inhaled steroid PPI for GERD    Hx of adenomatous colonic polyps   Last Assessment & Plan Note   03/17/2011 Office Visit Signed 03/26/2011  2:51 PM by Corbin Ade, MD    Pleasant 76 year old gentleman with multiple medical problems including history of complicated peptic ulcer disease and GERD and history of colonic polyps. He is up-to-date on Barrett's surveillance. He is due for a repeat EGD in 2014. No alarm symptoms at this time. He has a developed a cough the etiology which is not defined. I doubt he has reflux-induced cough. I would think of any number of the ENT or pulmonary etiologies firstly. I've advised him to followup with his primary care physician and pulmonologist for further evaluation. I do not feel that further GI evaluation in terms of pH/impedance studies are warranted at this time.   History of colonic polyps and positive family history of colon cancer in the setting of constipation with regular narcotic cough syrup use.  His last colonoscopy was in 2007. He needs to have a colonoscopy this time. I discussed the risks, benefits, limitations, alternatives and imponderables with the patient and spouse. Questions been answered. All parties agreeable. We'll plan to set this up in the near future the hospital. I've advised him to continue Colace as needed as a stool softener. Minimize the use of narcotic cough syrup. He may use MiraLax 17 g orally at bedtime on a nightly when necessary basis for constipation. A fiber supplement may be helpful as well. Further recommendations to follow.    Heart palpitations   Last Assessment & Plan Note   02/02/2012 Office Visit Signed 02/05/2012  9:14 PM by Oretha Milch, MD    Holter monitor for palpitations         Imaging: Dg Chest 2 View  01/31/2012  *RADIOLOGY REPORT*  Clinical Data: Irregular heart beat.  COPD.  CHEST - 2 VIEW  Comparison: CT  chest 11/19/2010 and plain film chest 03/29/2011.  Findings: Changes of severe bullous emphysema are again seen.  No focal airspace disease, pneumothorax or pleural effusion.  Heart size normal.  The patient is status post CABG.  IMPRESSION: Severe emphysema.  No acute finding.  Original Report Authenticated By: Bernadene Bell. D'ALESSIO, M.D.     [facility] Calculation: Score not calculated. Missing: Total Cholesterol

## 2012-02-25 NOTE — Patient Instructions (Addendum)
Your physician recommends that you schedule a follow-up appointment in: 6 months  Call if palpitations become troublesome   Addendum to OV from 5/10  Add lipid profile - Letter will sent to patient

## 2012-02-26 ENCOUNTER — Encounter: Payer: Self-pay | Admitting: Cardiology

## 2012-02-26 NOTE — Assessment & Plan Note (Signed)
Risk factors are well controlled, and patient is asymptomatic.

## 2012-02-26 NOTE — Assessment & Plan Note (Addendum)
Chronic obstructive pulmonary disease is clearly the patient's principal current medical problem and is being appropriately addressed.  Optimal treatment of lung disease involves use of medication that may contribute to his minor arrhythmias, but unless these arrhythmias worsen substantially, choice of agent should be based totally upon pulmonary response.  Recent oximetry on room air demonstrated saturation of 83-96% with values above 90% more than 90% of the time.  Accordingly, nocturnal oxygen was recently discontinued.

## 2012-02-26 NOTE — Assessment & Plan Note (Addendum)
No recent lipid profile available; one will be obtained. 

## 2012-02-26 NOTE — Progress Notes (Signed)
Patient ID: [name], male   DOB: [birth year], 76 y.o.   MRN: [id number]  HPI: Patient seen one month prior to planned return visit at the request of Dr. Vassie Loll for evaluation of supraventricular tachycardia.  This nice gentleman has recently experienced episodic palpitations that are quite brief and not aversive.  Holter tracing obtained and reviewed.  It reveals episodes of SVT with a rate of 125-150 bpm lasting a few seconds.  PVCs, paired PVCs and a few asymptomatic 3 beat runs of ventricular tachycardia were also present.  Prior to Admission medications   Medication Sig Start Date End Date Taking? Authorizing Provider  albuterol (PROAIR HFA) 108 (90 BASE) MCG/ACT inhaler Inhale 2 puffs into the lungs every 4 (four) hours as needed.     Yes Historical Provider, MD  albuterol (PROVENTIL) (2.5 MG/3ML) 0.083% nebulizer solution Take 3 mLs (2.5 mg total) by nebulization every 6 (six) hours as needed. 09/06/11  Yes Oretha Milch, MD  albuterol (PROVENTIL) 2 MG tablet Take 2 mg by mouth daily.     Yes Historical Provider, MD  amLODipine (NORVASC) 10 MG tablet Take 10 mg by mouth daily.  12/15/10  Yes Historical Provider, MD  aspirin 81 MG tablet Take 81 mg by mouth daily.     Yes Historical Provider, MD  DALIRESP 500 MCG TABS tablet 500 mcg daily.  09/17/11  Yes Historical Provider, MD  Docusate Calcium ([facility]S STOOL SOFTENER PO) Take by mouth every evening.     Yes Historical Provider, MD  Fluticasone-Salmeterol (ADVAIR) 250-50 MCG/DOSE AEPB Inhale 1 puff into the lungs daily. 11/04/11 11/03/12 Yes Tammy S Parrett, NP  furosemide (LASIX) 20 MG tablet Take 1 tablet by mouth Once daily as needed. 02/01/11  Yes Historical Provider, MD  HYDROcodone-acetaminophen (NORCO) 10-325 MG per tablet as needed.  09/17/11  Yes Historical Provider, MD  Omega-3 Fatty Acids (EQL FISH OIL) 1000 MG CAPS Take 2 capsules (2,000 mg total) by mouth daily. 01/13/11  Yes Kathlen Brunswick, MD  omeprazole (PRILOSEC) 20 MG capsule  Take 20 mg by mouth daily. 30 minutes before breakfast    Yes Historical Provider, MD  polyethylene glycol powder ([facility] PURELAX) powder Take 17 g by mouth daily.     Yes Historical Provider, MD  pravastatin (PRAVACHOL) 80 MG tablet TAKE 1 TABLET BY MOUTH AT BEDTIME 11/29/11  Yes Kathlen Brunswick, MD  tiotropium (SPIRIVA) 18 MCG inhalation capsule Place 1 capsule (18 mcg total) into inhaler and inhale daily. 11/04/11  Yes Tammy Rogers Seeds, NP  No Known Allergies    Past medical history, social history, and family history reviewed and updated.  ROS: Denies chest pain, PND, lightheadedness or syncope.  Long-standing dyspnea with minimal exertion as well as some dyspnea at rest.  All other systems reviewed and are negative.  PHYSICAL EXAM: BP 126/62  Pulse 77  Resp 18  Ht 5\' 11"  (1.803 m)  Wt 65.318 kg (144 lb)  BMI 20.08 kg/m2  General-Well developed; no acute distress Body habitus-thin Neck-No JVD; no carotid bruits Lungs-clear lung fields with decreased breath sounds; resonant to percussion; increased A-P diameter Cardiovascular-normal PMI; normal S1 and S2 in the epigastrium; grade 1-2/6 systolic murmur at the left sternal border; fourth heart sound present Abdomen-normal bowel sounds; soft and non-tender without masses or organomegaly; midline surgical scar but that healed by secondary intent with multiple surrounding abdominal wall hernias Musculoskeletal-No deformities, no cyanosis or clubbing Neurologic-Normal cranial nerves; symmetric strength and tone Skin-Warm, no significant lesions Extremities-distal  pulses intact; 1/2+ ankle edema  EKG:  Normal sinus rhythm, borderline first-degree AV block, left axis deviation, delayed R wave progression-cannot exclude previous anteroseptal MI, nonspecific ST-T wave abnormality.  No previous tracing for comparison.  ASSESSMENT AND PLAN:  [place] Bing, MD 02/26/2012 10:29 AM

## 2012-02-26 NOTE — Assessment & Plan Note (Signed)
All recorded blood pressures have been less than 140/90 since 2010 indicating excellent control of hypertension.

## 2012-02-26 NOTE — Assessment & Plan Note (Addendum)
Episodes of SVT are brief, not excessively rapid and are resulting in minor symptoms, which the patient tolerates well.  Beta blocker would be the treatment of choice from a cardiac standpoint, but is obviously not appropriate for this gentleman.  Although treatment with Diltiazem might provide some benefit, treatment really is not necessary at present.

## 2012-02-28 ENCOUNTER — Encounter: Payer: Self-pay | Admitting: *Deleted

## 2012-02-28 ENCOUNTER — Other Ambulatory Visit: Payer: Self-pay | Admitting: *Deleted

## 2012-02-29 ENCOUNTER — Encounter: Payer: Self-pay | Admitting: Pulmonary Disease

## 2012-03-01 DIAGNOSIS — I10 Essential (primary) hypertension: Secondary | ICD-10-CM | POA: Diagnosis not present

## 2012-03-01 DIAGNOSIS — D509 Iron deficiency anemia, unspecified: Secondary | ICD-10-CM | POA: Diagnosis not present

## 2012-03-03 DIAGNOSIS — I2510 Atherosclerotic heart disease of native coronary artery without angina pectoris: Secondary | ICD-10-CM | POA: Diagnosis not present

## 2012-03-03 DIAGNOSIS — I10 Essential (primary) hypertension: Secondary | ICD-10-CM | POA: Diagnosis not present

## 2012-03-03 DIAGNOSIS — J449 Chronic obstructive pulmonary disease, unspecified: Secondary | ICD-10-CM | POA: Diagnosis not present

## 2012-03-03 LAB — LIPID PANEL
Cholesterol: 155 mg/dL (ref 0–200)
HDL: 69 mg/dL (ref >39)
Total CHOL/HDL Ratio: 2.2 Ratio
VLDL: 16 mg/dL (ref 0–40)

## 2012-03-06 ENCOUNTER — Encounter: Payer: Self-pay | Admitting: *Deleted

## 2012-03-09 DIAGNOSIS — J449 Chronic obstructive pulmonary disease, unspecified: Secondary | ICD-10-CM | POA: Diagnosis not present

## 2012-03-09 DIAGNOSIS — E875 Hyperkalemia: Secondary | ICD-10-CM | POA: Diagnosis not present

## 2012-03-09 DIAGNOSIS — E870 Hyperosmolality and hypernatremia: Secondary | ICD-10-CM | POA: Diagnosis not present

## 2012-03-09 DIAGNOSIS — N182 Chronic kidney disease, stage 2 (mild): Secondary | ICD-10-CM | POA: Diagnosis not present

## 2012-03-14 ENCOUNTER — Telehealth: Payer: Self-pay | Admitting: Pulmonary Disease

## 2012-03-14 DIAGNOSIS — J449 Chronic obstructive pulmonary disease, unspecified: Secondary | ICD-10-CM

## 2012-03-14 NOTE — Telephone Encounter (Signed)
Spoke with patients wife-aware that RA is in the office tomorrow afternoon and will get response then.

## 2012-03-15 NOTE — Telephone Encounter (Signed)
Wife aware that order has been sent to DC O2.

## 2012-03-15 NOTE — Telephone Encounter (Signed)
Pl send order to [facility] to dc O2

## 2012-03-16 ENCOUNTER — Other Ambulatory Visit: Payer: Self-pay | Admitting: Pulmonary Disease

## 2012-03-27 ENCOUNTER — Ambulatory Visit: Payer: Medicare Other | Admitting: Adult Health

## 2012-04-29 ENCOUNTER — Other Ambulatory Visit: Payer: Self-pay | Admitting: Cardiology

## 2012-05-04 ENCOUNTER — Encounter: Payer: Self-pay | Admitting: Pulmonary Disease

## 2012-05-04 ENCOUNTER — Ambulatory Visit (INDEPENDENT_AMBULATORY_CARE_PROVIDER_SITE_OTHER): Payer: Medicare Other | Admitting: Pulmonary Disease

## 2012-05-04 VITALS — BP 132/82 | HR 66 | Temp 97.9°F | Ht 71.0 in | Wt 140.6 lb

## 2012-05-04 DIAGNOSIS — R053 Chronic cough: Secondary | ICD-10-CM

## 2012-05-04 DIAGNOSIS — R05 Cough: Secondary | ICD-10-CM | POA: Diagnosis not present

## 2012-05-04 DIAGNOSIS — J449 Chronic obstructive pulmonary disease, unspecified: Secondary | ICD-10-CM

## 2012-05-04 DIAGNOSIS — J4489 Other specified chronic obstructive pulmonary disease: Secondary | ICD-10-CM

## 2012-05-04 NOTE — Assessment & Plan Note (Signed)
Gold stg 3-4; Off o2 since 4/13 cta dvair , spiriva Stop daliresp due to unexplained wt loss

## 2012-05-04 NOTE — Assessment & Plan Note (Signed)
Combination of COPD & GERD & ? Allergies given seasonal component ENT exam neg Now resolved

## 2012-05-04 NOTE — Patient Instructions (Addendum)
Stop daliresp - this may be contributing to weight loss OK to stay off oxygen

## 2012-05-04 NOTE — Progress Notes (Signed)
  Subjective:    Patient ID: [name], male    DOB: 16-Mar-1936, 76 y.o.   MRN: [id number]  HPI  PMD - [name]   75/M, ex- smokerfor FU of severe COPD on nocturnal O2 since 5/11 & cough.  5/10 PFTs >> severe obstruction, FEV1 39%, no BD response  He developed a chronic cough.since his heart surgery by [name] in 6/09 Barrett's esophagus 3 cm noted to be stable by [name].  H/o sinus surgery, tonsillectomy  CT chest 4/09 severe emphysema, scattered sub-cm nodules stable since 2005  Hosp adm in 2/12 >> for cough syncope - admitted to tele but transferred to 2900 for another syncopal event  CT chest 11/16/10 neg PE, stable nodules, small RLL infx Rxed for HCAP with cipro & zosyn  Modified Ba swallow nml  11/04/2011 Started on Daliresp by Primary MD -takes every other day .    05/04/2012 4/13 ono on RA showed only 2 min desatn < 88% -has been  off O2  Uses nebs daily No flare in last few mnths daliresp makes him 'mean' , has lost wt , Ct angio 2/12 did not show any suspicious nodules  Review of Systems Patient denies significant dyspnea,cough, hemoptysis,  chest pain, palpitations, pedal edema, orthopnea, paroxysmal nocturnal dyspnea, lightheadedness, nausea, vomiting, abdominal or  leg pains      Objective:   Physical Exam  Gen. Pleasant, well-nourished, in no distress ENT - no lesions, no post nasal drip Neck: No JVD, no thyromegaly, no carotid bruits Lungs: no use of accessory muscles, no dullness to percussion, clear without rales or rhonchi  Cardiovascular: Rhythm regular, heart sounds  normal, no murmurs or gallops, no peripheral edema Musculoskeletal: No deformities, no cyanosis or clubbing        Assessment & Plan:

## 2012-05-10 DIAGNOSIS — N182 Chronic kidney disease, stage 2 (mild): Secondary | ICD-10-CM | POA: Diagnosis not present

## 2012-05-10 DIAGNOSIS — D509 Iron deficiency anemia, unspecified: Secondary | ICD-10-CM | POA: Diagnosis not present

## 2012-06-19 ENCOUNTER — Other Ambulatory Visit: Payer: Self-pay | Admitting: Pulmonary Disease

## 2012-06-22 ENCOUNTER — Telehealth: Payer: Self-pay | Admitting: Pulmonary Disease

## 2012-06-22 MED ORDER — ALBUTEROL SULFATE (2.5 MG/3ML) 0.083% IN NEBU: 2.5000 mg | INHALATION_SOLUTION | Freq: Four times a day (QID) | RESPIRATORY_TRACT | Status: DC | PRN

## 2012-06-22 NOTE — Telephone Encounter (Signed)
RX for albuterol nebs sent [facility] with diagnosis code.

## 2012-07-03 ENCOUNTER — Ambulatory Visit (INDEPENDENT_AMBULATORY_CARE_PROVIDER_SITE_OTHER): Payer: Medicare Other | Admitting: Adult Health

## 2012-07-03 ENCOUNTER — Encounter: Payer: Self-pay | Admitting: Adult Health

## 2012-07-03 VITALS — BP 118/60 | HR 68 | Temp 97.3°F | Ht 71.0 in | Wt 146.0 lb

## 2012-07-03 DIAGNOSIS — J449 Chronic obstructive pulmonary disease, unspecified: Secondary | ICD-10-CM

## 2012-07-03 MED ORDER — PREDNISONE 10 MG PO TABS: ORAL_TABLET | ORAL | Status: DC

## 2012-07-03 MED ORDER — AMOXICILLIN-POT CLAVULANATE 875-125 MG PO TABS: 1.0000 | ORAL_TABLET | Freq: Two times a day (BID) | ORAL | Status: AC

## 2012-07-03 NOTE — Progress Notes (Signed)
  Subjective:    Patient ID: [name], male    DOB: 29-Aug-1936, 76 y.o.   MRN: [id number]  HPI   PMD - [name]   75/M, ex- smoker- severe COPD on nocturnal O2 since 5/11 & cough.  5/10 PFTs >> severe obstruction, FEV1 39%, no BD response  He developed a chronic cough.since his heart surgery by [name] in 6/09 Barrett's esophagus 3 cm noted to be stable by [name].  H/o sinus surgery, tonsillectomy  CT chest 4/09 severe emphysema, scattered sub-cm nodules stable since 2005  Hosp adm in 2/12 >> for cough syncope - admitted to tele but transferred to 2900 for another syncopal event  CT chest 11/16/10 neg PE, stable nodules, small RLL infx Rxed for HCAP with cipro & zosyn  Modified Ba swallow nml  11/04/2011 Started on Daliresp by Primary MD -takes every other day .   05/04/2012 4/13 ono on RA showed only 2 min desatn < 88% -has been  off O2  Uses nebs daily No flare in last few mnths daliresp makes him 'mean' , has lost wt , Ct angio 2/12 did not show any suspicious nodules >>daliresp stopped   07/03/2012 Acute OV  Complains of increased SOB, wheezing, tightness in chest, prod cough with white mucus x 1 month.  OTC not working  Wants to try alternative med for Spiriva  No hemoptysis or chest pain . No edema.      Review of Systems  Constitutional:   No  weight loss, night sweats,  Fevers, chills,  +fatigue, or  lassitude.  HEENT:   No headaches,  Difficulty swallowing,  Tooth/dental problems, or  Sore throat,                No sneezing, itching, ear ache,  +nasal congestion, post nasal drip,   CV:  No chest pain,  Orthopnea, PND, swelling in lower extremities, anasarca, dizziness, palpitations, syncope.   GI  No heartburn, indigestion, abdominal pain, nausea, vomiting, diarrhea, change in bowel habits, loss of appetite, bloody stools.   Resp:   No coughing up of blood.     No chest wall deformity  Skin: no rash or lesions.  GU: no dysuria, change in color of  urine, no urgency or frequency.  No flank pain, no hematuria   MS:  No joint pain or swelling.  No decreased range of motion.     Psych:  No change in mood or affect. No depression or anxiety.  No memory loss.          Objective:   Physical Exam   Gen. Pleasant, well-nourished, in no distress ENT - no lesions, no post nasal drip Neck: No JVD, no thyromegaly, no carotid bruits Lungs: coarse BS  Cardiovascular: Rhythm regular, heart sounds  normal, no murmurs or gallops, no peripheral edema Musculoskeletal: No deformities, no cyanosis or clubbing        Assessment & Plan:

## 2012-07-03 NOTE — Patient Instructions (Addendum)
Augmentin 875mg  Twice daily  For 7 days  Mucinex DM Twice daily  As needed  Cough/congesiton  Fluids and rest  Prednisone taper over next week.  May try Turdoza Twice daily  -this is in place of Spiriva  Call back if you like this better.  Please contact office for sooner follow up if symptoms do not improve or worsen or seek emergency care  follow up Dr. Vassie Loll  As planned and As needed

## 2012-07-06 NOTE — Assessment & Plan Note (Signed)
Flare   Plan  Augmentin 875mg  Twice daily  For 7 days  Mucinex DM Twice daily  As needed  Cough/congesiton  Fluids and rest  Prednisone taper over next week.  May try Turdoza Twice daily  -this is in place of Spiriva  Call back if you like this better.  Please contact office for sooner follow up if symptoms do not improve or worsen or seek emergency care  follow up Dr. Vassie Loll  As planned and As needed

## 2012-07-11 DIAGNOSIS — J449 Chronic obstructive pulmonary disease, unspecified: Secondary | ICD-10-CM | POA: Diagnosis not present

## 2012-07-11 DIAGNOSIS — D649 Anemia, unspecified: Secondary | ICD-10-CM | POA: Diagnosis not present

## 2012-07-11 DIAGNOSIS — E875 Hyperkalemia: Secondary | ICD-10-CM | POA: Diagnosis not present

## 2012-07-11 DIAGNOSIS — N182 Chronic kidney disease, stage 2 (mild): Secondary | ICD-10-CM | POA: Diagnosis not present

## 2012-07-11 DIAGNOSIS — E870 Hyperosmolality and hypernatremia: Secondary | ICD-10-CM | POA: Diagnosis not present

## 2012-07-19 ENCOUNTER — Telehealth: Payer: Self-pay | Admitting: Pulmonary Disease

## 2012-07-19 NOTE — Telephone Encounter (Signed)
Two things we can do.  One is to have him come in tomorrow to see someone. The other is to put him back on prednisone until he see RA, but it is only 48hrs. Either way, needs to go to ER if he worsens.  Prednisone 10mg :  4 each day until he sees RA this Friday.

## 2012-07-19 NOTE — Telephone Encounter (Signed)
Per 9.16.13 ov w/ TP: Patient Instructions     Augmentin 875mg  Twice daily For 7 days  Mucinex DM Twice daily As needed Cough/congesiton  Fluids and rest  Prednisone taper over next week.  May try Turdoza Twice daily -this is in place of Spiriva  Call back if you like this better.  Please contact office for sooner follow up if symptoms do not improve or worsen or seek emergency care  follow up Dr. Vassie Loll As planned and As needed   ========================================= Called spoke with pt's wife who reported that pt's breathing has not improved and may be a little worse since last ov w/ TP.  Pt has finished his abx and pred taper as directed and finished the tudorza today.  Feels like the tudorza is not helping.  Has a follow up w/ RA on Friday 10.4.13 - would like recs to last until ov.  Pt does have 3 capsules of spiriva.  RA nor TP in office this afternoon.  Dr Shelle Iron please advise, thanks. No Known Allergies

## 2012-07-19 NOTE — Telephone Encounter (Signed)
I spoke with spouse and she decided to schedule pt to come in and be seen in the AM. He is scheduled to see KC at 9:00 AM 07/20/12. Aware for pt to seek emergency care if he worsens. Will forward to RA as an [place]

## 2012-07-20 ENCOUNTER — Encounter: Payer: Self-pay | Admitting: Pulmonary Disease

## 2012-07-20 ENCOUNTER — Ambulatory Visit (INDEPENDENT_AMBULATORY_CARE_PROVIDER_SITE_OTHER)
Admission: RE | Admit: 2012-07-20 | Discharge: 2012-07-20 | Disposition: A | Discharge status: Home / Self Care | Payer: Medicare Other | Source: Ambulatory Visit | Attending: Pulmonary Disease | Admitting: Pulmonary Disease

## 2012-07-20 ENCOUNTER — Ambulatory Visit (INDEPENDENT_AMBULATORY_CARE_PROVIDER_SITE_OTHER): Payer: Medicare Other | Admitting: Pulmonary Disease

## 2012-07-20 VITALS — BP 126/70 | HR 73 | Temp 97.2°F | Ht 71.0 in | Wt 147.2 lb

## 2012-07-20 DIAGNOSIS — Z23 Encounter for immunization: Secondary | ICD-10-CM

## 2012-07-20 DIAGNOSIS — J449 Chronic obstructive pulmonary disease, unspecified: Secondary | ICD-10-CM

## 2012-07-20 DIAGNOSIS — R0602 Shortness of breath: Secondary | ICD-10-CM | POA: Diagnosis not present

## 2012-07-20 MED ORDER — PREDNISONE 10 MG PO TABS: ORAL_TABLET | ORAL | Status: DC

## 2012-07-20 NOTE — Assessment & Plan Note (Signed)
The patient continues to have dyspnea on exertion that is greatly increased from his usual baseline.  I do not detect active wheezing on exam today, nor does his history suggest a definite pulmonary infection.  He was recently treated by our nurse practitioner with antibiotics and prednisone, and he is unsure if he improved from this.  I will check a chest x-ray for completeness today, and we'll also try him on chronic low-dose prednisone for a few weeks to see if this makes a difference.  It may be that he has reached a critical FEV1, and that nothing is going to improve his breathing from this point forward.  We could consider putting him on nebulized bronchodilators alone, but I will leave that to his primary pulmonologist.  Finally, would also consider whether his heart disease could possibly be playing a role in his shortness of breath as well.

## 2012-07-20 NOTE — Progress Notes (Signed)
  Subjective:    Patient ID: [name], male    DOB: 11-26-1935, 76 y.o.   MRN: [id number]  HPI The patient comes in today for an acute sick visit.  He has known severe emphysema, is usually followed by Dr. Vassie Loll.  He was seen by our nurse practitioner a few weeks ago for what sounds like an acute exacerbation, and has been treated with antibiotics as well as a prednisone taper.  He did not see improvement in his breathing with Spiriva, and was given a trial of tudorza at the last visit.  He did not see any change in his breathing with any of these therapies.  He comes in today where he is continuing to have significant dyspnea on exertion that is far from his usual baseline.  He does have cough but only brings up scant discolored mucus.  He does not feel he has a chest cold.    Review of Systems  Constitutional: Negative for fever and unexpected weight change.  HENT: Positive for sore throat. Negative for ear pain, nosebleeds, congestion, rhinorrhea, sneezing, trouble swallowing, dental problem, postnasal drip and sinus pressure.   Eyes: Positive for redness and itching.  Respiratory: Positive for cough, chest tightness, shortness of breath and stridor. Negative for wheezing.   Cardiovascular: Positive for leg swelling. Negative for palpitations.  Gastrointestinal: Negative for nausea and vomiting.  Genitourinary: Negative for dysuria.  Musculoskeletal: Negative for joint swelling.  Skin: Negative for rash.  Neurological: Positive for headaches.  Hematological: Bruises/bleeds easily.  Psychiatric/Behavioral: Positive for dysphoric mood. The patient is nervous/anxious.        Objective:   Physical Exam Frail-appearing male in no acute distress Nose without purulence or discharge noted Neck without lymphadenopathy or thyromegaly Chest with decreased breath sounds throughout, no wheezing Cardiac exam distant heart sounds, but sounds regular Lower extremities with minimal edema, no  cyanosis Alert and oriented, moves all 4 extremities.       Assessment & Plan:

## 2012-07-20 NOTE — Patient Instructions (Addendum)
Will check cxr today and call you with results. Will treat with a course of prednisone, and leave you on 10mg  each day until you followup with Dr. Vassie Loll. Will give you a flu shot today. Will start on oxygen with activity, and check oxygen level overnight to see if you need this as well.  Will cancel apptm with Dr. Vassie Loll for tomorrow, and reschedule for 3-4 weeks.

## 2012-07-21 ENCOUNTER — Ambulatory Visit: Payer: Medicare Other | Admitting: Pulmonary Disease

## 2012-07-21 NOTE — Progress Notes (Signed)
Quick Note:  ATC pt x1 - line rang multiple times with no answer and no option to leave a message. ______

## 2012-07-25 ENCOUNTER — Telehealth: Payer: Self-pay | Admitting: Pulmonary Disease

## 2012-07-25 DIAGNOSIS — J449 Chronic obstructive pulmonary disease, unspecified: Secondary | ICD-10-CM

## 2012-07-25 MED ORDER — TIOTROPIUM BROMIDE MONOHYDRATE 18 MCG IN CAPS: 18.0000 ug | ORAL_CAPSULE | Freq: Every day | RESPIRATORY_TRACT | Status: DC

## 2012-07-25 NOTE — Telephone Encounter (Signed)
Called, spoke with Bosie Clos.  States pt is in the donut hole -- requesting samples of spiriva.  Advised 2 samples placed at front.  Bosie Clos was very appreciative of this. Nothing further needed.

## 2012-07-27 ENCOUNTER — Other Ambulatory Visit: Payer: Self-pay | Admitting: Pulmonary Disease

## 2012-07-27 DIAGNOSIS — J449 Chronic obstructive pulmonary disease, unspecified: Secondary | ICD-10-CM

## 2012-08-07 ENCOUNTER — Telehealth: Payer: Self-pay | Admitting: Pulmonary Disease

## 2012-08-07 NOTE — Telephone Encounter (Signed)
i spoke with spouse and is aware of samples left upfront for pick up. Nothing further was needed

## 2012-08-23 ENCOUNTER — Ambulatory Visit (INDEPENDENT_AMBULATORY_CARE_PROVIDER_SITE_OTHER)
Admission: RE | Admit: 2012-08-23 | Discharge: 2012-08-23 | Disposition: A | Discharge status: Home / Self Care | Payer: Medicare Other | Source: Ambulatory Visit | Attending: Pulmonary Disease | Admitting: Pulmonary Disease

## 2012-08-23 ENCOUNTER — Encounter: Payer: Self-pay | Admitting: Pulmonary Disease

## 2012-08-23 ENCOUNTER — Ambulatory Visit (INDEPENDENT_AMBULATORY_CARE_PROVIDER_SITE_OTHER): Payer: Medicare Other | Admitting: Pulmonary Disease

## 2012-08-23 VITALS — BP 106/58 | HR 72 | Temp 97.6°F | Ht 71.0 in | Wt 150.0 lb

## 2012-08-23 DIAGNOSIS — J449 Chronic obstructive pulmonary disease, unspecified: Secondary | ICD-10-CM

## 2012-08-23 NOTE — Patient Instructions (Addendum)
Pulm rehab referral Ok to stop prednisone once done Stay on spiriva Increase advair twice daily Chest xray is clear Multivitamin daily

## 2012-08-23 NOTE — Progress Notes (Signed)
  Subjective:    Patient ID: [name], male    DOB: Mar 06, 1936, 76 y.o.   MRN: [id number]  HPI PMD - [name]   76/M, ex- smoker- severe COPD on nocturnal O2 since 5/11 & cough.  5/10 PFTs >> severe obstruction, FEV1 39%, no BD response  He developed a chronic cough.since his heart surgery by [name] in 6/09  Barrett's esophagus 3 cm noted to be stable by [name].  H/o sinus surgery, tonsillectomy  CT chest 4/09 severe emphysema, scattered sub-cm nodules stable since 2005  Hosp adm in 2/12 >> for cough syncope - admitted to tele but transferred to 2900 for another syncopal event  CT chest 11/16/10 neg PE, stable nodules, small RLL infx Rxed for HCAP with cipro & zosyn  Modified Ba swallow nml  Ct angio 2/12 did not show any suspicious nodules   11/04/2011 Started on Daliresp by Primary MD - stopped 4/13, makes him 'mean' , has lost wt ,  4/13 ono on RA showed only 2 min desatn < 88% -has been off O2      08/23/2012 - happy b'day!   07/03/2012 Acute OV  >> augmentin, pred, trial of tudorza  Acute OV - 10/13 ([facility]) - CXR - bibasal scarring, nipple shadow clarified with markers He did not see improvement in his breathing with Spiriva, and was given a trial of tudorza at the last visit. He did not see any change in his breathing with any of these therapies >> trial of prednisone C/o good and bad days w/ breathing, occasional cough, wheezing, chest tx. uses oxygen PRN during day and everynight at bedtime No hemoptysis or chest pain . No edema.     Review of Systems Patient denies significant dyspnea,cough, hemoptysis,  chest pain, palpitations, pedal edema, orthopnea, paroxysmal nocturnal dyspnea, lightheadedness, nausea, vomiting, abdominal or  leg pains      Objective:   Physical Exam  Gen. Pleasant, well-nourished, in no distress ENT - no lesions, no post nasal drip Neck: No JVD, no thyromegaly, no carotid bruits Lungs: no use of accessory muscles, no dullness to  percussion, decreased BL without rales or rhonchi  Cardiovascular: Rhythm regular, heart sounds  normal, no murmurs or gallops, no peripheral edema Musculoskeletal: No deformities, no cyanosis or clubbing        Assessment & Plan:

## 2012-08-23 NOTE — Assessment & Plan Note (Signed)
Pulm rehab referral Ok to stop prednisone once done Stay on spiriva Increase advair twice daily Chest xray is clear

## 2012-08-28 ENCOUNTER — Ambulatory Visit (INDEPENDENT_AMBULATORY_CARE_PROVIDER_SITE_OTHER): Payer: Medicare Other | Admitting: Cardiology

## 2012-08-28 ENCOUNTER — Encounter: Payer: Self-pay | Admitting: Cardiology

## 2012-08-28 VITALS — BP 110/52 | HR 68 | Ht 71.0 in | Wt 149.0 lb

## 2012-08-28 DIAGNOSIS — E785 Hyperlipidemia, unspecified: Secondary | ICD-10-CM

## 2012-08-28 DIAGNOSIS — I10 Essential (primary) hypertension: Secondary | ICD-10-CM

## 2012-08-28 DIAGNOSIS — I471 Supraventricular tachycardia: Secondary | ICD-10-CM

## 2012-08-28 DIAGNOSIS — J449 Chronic obstructive pulmonary disease, unspecified: Secondary | ICD-10-CM | POA: Diagnosis not present

## 2012-08-28 DIAGNOSIS — I2510 Atherosclerotic heart disease of native coronary artery without angina pectoris: Secondary | ICD-10-CM

## 2012-08-28 DIAGNOSIS — I498 Other specified cardiac arrhythmias: Secondary | ICD-10-CM

## 2012-08-28 NOTE — Assessment & Plan Note (Signed)
Episodes of SVT have been very brief in the past and were only captured on a continuous ambulatory ECG.  No symptoms to suggest any progression of this mild arrhythmia.

## 2012-08-28 NOTE — Assessment & Plan Note (Signed)
Blood pressure control has been excellent with current medications, which will be continued. 

## 2012-08-28 NOTE — Progress Notes (Deleted)
Name: [name]    DOB: 1936/05/29  Age: 76 y.o.  MR#: [id number]       PCP:  Cassell Smiles., MD      Insurance: @PAYORNAME @   CC:   No chief complaint on file.   MEDICATION LIST SOB - DR [name] MANAGING NO RECENT LABS OF INTEREST  VS BP 110/52  Pulse 68  Ht 5\' 11"  (1.803 m)  Wt 149 lb (67.586 kg)  BMI 20.78 kg/m2  Weights Current Weight  08/28/12 149 lb (67.586 kg)  08/23/12 150 lb (68.04 kg)  07/20/12 147 lb 3.2 oz (66.769 kg)    Blood Pressure  BP Readings from Last 3 Encounters:  08/28/12 110/52  08/23/12 106/58  07/20/12 126/70     Admit date:  (Not on file) Last encounter with RMR:  04/29/2012   Allergy No Known Allergies  Current Outpatient Prescriptions  Medication Sig Dispense Refill  . albuterol (PROVENTIL) (2.5 MG/3ML) 0.083% nebulizer solution Take 3 mLs (2.5 mg total) by nebulization every 6 (six) hours as needed. DX:  496  360 mL  3  . albuterol (PROVENTIL) 2 MG tablet Take 2 mg by mouth daily.        Marland Kitchen amLODipine (NORVASC) 10 MG tablet Take 10 mg by mouth daily.       Marland Kitchen aspirin 81 MG tablet Take 81 mg by mouth daily.        . Docusate Calcium ([employer]S STOOL SOFTENER PO) Take by mouth as needed.       . Fluticasone-Salmeterol (ADVAIR) 250-50 MCG/DOSE AEPB Inhale 1 puff into the lungs daily.  60 each  5  . furosemide (LASIX) 20 MG tablet Take 1 tablet by mouth Once daily as needed.      Marland Kitchen HYDROcodone-acetaminophen (NORCO) 10-325 MG per tablet as needed.       . Multiple Vitamin (MULTIVITAMIN) tablet Take 1 tablet by mouth daily.      . Omega-3 Fatty Acids (EQL FISH OIL) 1000 MG CAPS Take 2 capsules (2,000 mg total) by mouth daily.  60 capsule  3  . omeprazole (PRILOSEC) 20 MG capsule Take 20 mg by mouth daily. 30 minutes before breakfast       . polyethylene glycol powder ([employer]S PURELAX) powder Take 17 g by mouth daily.        . pravastatin (PRAVACHOL) 80 MG tablet TAKE 1 TABLET BY MOUTH AT BEDTIME  30 tablet  4  . PROAIR HFA 108 (90 BASE) MCG/ACT  inhaler INHALE 2 PUFFS BY MOUTH EVERY 4 HOURS AS NEEDED FOR SHORTNESS OF BREATH /WHEEZE  1 Inhaler  3  . tiotropium (SPIRIVA) 18 MCG inhalation capsule Place 1 capsule (18 mcg total) into inhaler and inhale daily.  20 capsule  0    Discontinued Meds:   There are no discontinued medications.  Patient Active Problem List  Diagnosis  . HYPERLIPIDEMIA  . HYPERTENSION  . C O P D  . Chronic cough  . Coronary artery disease  . Hx of adenomatous colonic polyps  . Supraventricular tachycardia    LABS No visits with results within 3 Month(s) from this visit. Latest known visit with results is:  Office Visit on 02/25/2012  Component Date Value  . Cholesterol 03/03/2012 155   . Triglycerides 03/03/2012 78   . HDL 03/03/2012 69   . Total CHOL/HDL Ratio 03/03/2012 2.2   . VLDL 03/03/2012 16   . LDL Cholesterol 03/03/2012 70      Results for this Opt Visit:  Results for orders placed in visit on 02/25/12  LIPID PANEL      Component Value Range   Cholesterol 155  0 - 200 mg/dL   Triglycerides 78  <295 mg/dL   HDL 69  >62 mg/dL   Total CHOL/HDL Ratio 2.2     VLDL 16  0 - 40 mg/dL   LDL Cholesterol 70  0 - 99 mg/dL    EKG Orders placed in visit on 02/25/12  . EKG 12-LEAD     Prior Assessment and Plan Problem List as of 08/28/2012            Cardiology Problems   HYPERLIPIDEMIA   Last Assessment & Plan Note   02/25/2012 Office Visit Addendum 02/28/2012 10:17 PM by Kathlen Brunswick, MD    No recent lipid profile available-one will be obtained.    HYPERTENSION   Last Assessment & Plan Note   02/25/2012 Office Visit Signed 02/26/2012 10:48 AM by Kathlen Brunswick, MD    All recorded blood pressures have been less than 140/90 since 2010 indicating excellent control of hypertension.    Coronary artery disease   Last Assessment & Plan Note   02/25/2012 Office Visit Signed 02/26/2012 10:43 AM by Kathlen Brunswick, MD    Risk factors are well controlled, and patient is  asymptomatic.    Supraventricular tachycardia   Last Assessment & Plan Note   02/25/2012 Office Visit Addendum 02/28/2012 10:19 PM by Kathlen Brunswick, MD    Episodes of SVT are brief, not excessively rapid and are resulting in minor symptoms, which the patient tolerates well.  Beta blocker would be the treatment of choice from a cardiac standpoint, but is obviously not appropriate for this gentleman.  Although treatment with Diltiazem might provide some benefit, treatment really is not necessary at present.      Other   C Erskin Burnet D   Last Assessment & Plan Note   08/23/2012 Office Visit Signed 08/23/2012  1:24 PM by Oretha Milch, MD    Pulm rehab referral Ok to stop prednisone once done Stay on spiriva Increase advair twice daily Chest xray is clear    Chronic cough   Last Assessment & Plan Note   05/04/2012 Office Visit Signed 05/04/2012  5:32 PM by Oretha Milch, MD    Combination of COPD & GERD & ? Allergies given seasonal component ENT exam neg Now resolved    Hx of adenomatous colonic polyps   Last Assessment & Plan Note   03/17/2011 Office Visit Signed 03/26/2011  2:51 PM by Corbin Ade, MD    Pleasant 76 year old gentleman with multiple medical problems including history of complicated peptic ulcer disease and GERD and history of colonic polyps. He is up-to-date on Barrett's surveillance. He is due for a repeat EGD in 2014. No alarm symptoms at this time. He has a developed a cough the etiology which is not defined. I doubt he has reflux-induced cough. I would think of any number of the ENT or pulmonary etiologies firstly. I've advised him to followup with his primary care physician and pulmonologist for further evaluation. I do not feel that further GI evaluation in terms of pH/impedance studies are warranted at this time.   History of colonic polyps and positive family history of colon cancer in the setting of constipation with regular narcotic cough syrup use.  His last colonoscopy  was in 2007. He needs to have a colonoscopy this time. I discussed the risks, benefits,  limitations, alternatives and imponderables with the patient and spouse. Questions been answered. All parties agreeable. We'll plan to set this up in the near future the hospital. I've advised him to continue Colace as needed as a stool softener. Minimize the use of narcotic cough syrup. He may use MiraLax 17 g orally at bedtime on a nightly when necessary basis for constipation. A fiber supplement may be helpful as well. Further recommendations to follow.        Imaging: Dg Chest 1 View  08/23/2012  *RADIOLOGY REPORT*  Clinical Data: Follow up abnormal chest x-ray with question nodule versus nipple shadow  CHEST - 1 VIEW  Comparison: 07/20/2012  Findings: Normal heart size post median sternotomy. Calcified tortuous aorta. Pulmonary vascularity normal. Emphysematous and minimal bronchitic changes consistent with COPD. Increased markings at lung bases similar to previous study likely scarring. No definite acute infiltrate, pleural effusion or pneumothorax. Right nipple marker corresponds to nodular density seen on previous exam. No pulmonary nodule identified. Bones demineralized.  IMPRESSION: COPD with bibasilar scarring. Nipple shadow on previous exam; no pulmonary nodule identified. No acute abnormalities.   Original Report Authenticated By: Ulyses Southward, M.D.      [facility] Calculation: Score not calculated. Missing: Total Cholesterol

## 2012-08-28 NOTE — Progress Notes (Signed)
Patient ID: [name], male   DOB: March 29, 1936, 76 y.o.   MRN: [id number]  HPI: Scheduled return visit for this very pleasant gentleman with coronary artery disease and cardiovascular risk factors.  Since his last visit, his done generally well.  He experiences frequent leg cramps that respond to exposure to soap.  He denies claudication.  He recently suffered a URI and exacerbation of chronic obstructive pulmonary disease for which 2 courses of prednisone were required.  He has had no chest discomfort or other apparent manifestations of cardiovascular or vascular disease.  Prior to Admission medications   Medication Sig Start Date End Date Taking? Authorizing Provider  albuterol (PROVENTIL) (2.5 MG/3ML) 0.083% nebulizer solution Take 3 mLs (2.5 mg total) by nebulization every 6 (six) hours as needed. DX:  496 06/22/12  Yes Oretha Milch, MD  albuterol (PROVENTIL) 2 MG tablet Take 2 mg by mouth daily.     Yes Historical Provider, MD  amLODipine (NORVASC) 10 MG tablet Take 10 mg by mouth daily.  12/15/10  Yes Historical Provider, MD  aspirin 81 MG tablet Take 81 mg by mouth daily.     Yes Historical Provider, MD  Docusate Calcium ([employer]S STOOL SOFTENER PO) Take by mouth as needed.    Yes Historical Provider, MD  Fluticasone-Salmeterol (ADVAIR) 250-50 MCG/DOSE AEPB Inhale 1 puff into the lungs daily. 11/04/11 11/03/12 Yes Tammy S Parrett, NP  furosemide (LASIX) 20 MG tablet Take 1 tablet by mouth Once daily as needed. 02/01/11  Yes Historical Provider, MD  HYDROcodone-acetaminophen (NORCO) 10-325 MG per tablet as needed.  09/17/11  Yes Historical Provider, MD  Multiple Vitamin (MULTIVITAMIN) tablet Take 1 tablet by mouth daily.   Yes Historical Provider, MD  Omega-3 Fatty Acids (EQL FISH OIL) 1000 MG CAPS Take 2 capsules (2,000 mg total) by mouth daily. 01/13/11  Yes Kathlen Brunswick, MD  omeprazole (PRILOSEC) 20 MG capsule Take 20 mg by mouth daily. 30 minutes before breakfast    Yes Historical Provider,  MD  polyethylene glycol powder ([employer]S PURELAX) powder Take 17 g by mouth daily.     Yes Historical Provider, MD  pravastatin (PRAVACHOL) 80 MG tablet TAKE 1 TABLET BY MOUTH AT BEDTIME 04/29/12  Yes Kathlen Brunswick, MD  PROAIR HFA 108 (90 BASE) MCG/ACT inhaler INHALE 2 PUFFS BY MOUTH EVERY 4 HOURS AS NEEDED FOR SHORTNESS OF Zella Ball Wilfred Lacy 03/16/12  Yes Oretha Milch, MD  tiotropium (SPIRIVA) 18 MCG inhalation capsule Place 1 capsule (18 mcg total) into inhaler and inhale daily. 07/25/12  Yes Oretha Milch, MD   No Known Allergies    Past medical history, social history, and family history reviewed and updated.  ROS: Denies palpitations, lightheadedness, syncope, orthopnea or PND.  He has stable class 2-3 dyspnea on exertion.  There is no chronic cough or sputum production.  All other systems reviewed and are negative.  PHYSICAL EXAM: BP 110/52  Pulse 68  Ht 5\' 11"  (1.803 m)  Wt 67.586 kg (149 lb)  BMI 20.78 kg/m2  General-Well developed; no acute distress; mildly hoarse voice Body habitus-thin Neck-No JVD; no carotid bruits Lungs-clear lung fields; resonant to percussion; prolonged expiratory phase Cardiovascular-normal PMI; normal S1 and S2; S4 present Abdomen-normal bowel sounds; soft and non-tender without masses or organomegaly Musculoskeletal-No deformities, no cyanosis or clubbing Neurologic-Normal cranial nerves; symmetric strength and tone Skin-Warm, no significant lesions Extremities-distal pulses 1-2+; no edema  Rhythm Strip: Normal sinus rhythm, technically difficult tracing, sinus arrhythmia, heart rate-73 bpm  ASSESSMENT  AND PLAN:  Bartlett Bing, MD 08/28/2012 1:21 PM

## 2012-08-28 NOTE — Patient Instructions (Addendum)
Your physician recommends that you schedule a follow-up appointment in: 1 year  

## 2012-08-28 NOTE — Assessment & Plan Note (Signed)
No symptoms to suggest recurrent myocardial ischemia.  Our focus will continue to be on optimal control of cardiovascular risk factors.

## 2012-08-28 NOTE — Assessment & Plan Note (Signed)
Chronic lung diseases patient's principal problem for which he is followed closely by Dr. Vassie Loll.  Current therapy appears optimal.

## 2012-08-28 NOTE — Assessment & Plan Note (Signed)
Lipid profile was excellent 6 months ago; current therapy will be continued.

## 2012-09-05 ENCOUNTER — Telehealth: Payer: Self-pay | Admitting: Pulmonary Disease

## 2012-09-05 NOTE — Telephone Encounter (Signed)
Returning call.

## 2012-09-05 NOTE — Telephone Encounter (Signed)
i spoke with spouse and is aware. Nothing further was needed

## 2012-09-05 NOTE — Telephone Encounter (Signed)
OK to use & see if thishelps

## 2012-09-05 NOTE — Telephone Encounter (Signed)
lmomtcb x1 

## 2012-09-05 NOTE — Telephone Encounter (Signed)
Patients wife called and is wanting to check with Dr Vassie Loll about their new air purifier. There was a warning label to check with Doctor if you suffer from asthma or lung disease before using.   Patient would like to know if okay to go forth with using this new machine.  Please advise Dr Vassie Loll. Thanks.  Okay to leave detailed message on machine if not home when call back.

## 2012-09-08 DIAGNOSIS — D649 Anemia, unspecified: Secondary | ICD-10-CM | POA: Diagnosis not present

## 2012-09-08 DIAGNOSIS — N182 Chronic kidney disease, stage 2 (mild): Secondary | ICD-10-CM | POA: Diagnosis not present

## 2012-09-15 DIAGNOSIS — I10 Essential (primary) hypertension: Secondary | ICD-10-CM | POA: Diagnosis not present

## 2012-09-15 DIAGNOSIS — E785 Hyperlipidemia, unspecified: Secondary | ICD-10-CM | POA: Diagnosis not present

## 2012-09-15 DIAGNOSIS — I2510 Atherosclerotic heart disease of native coronary artery without angina pectoris: Secondary | ICD-10-CM | POA: Diagnosis not present

## 2012-09-15 DIAGNOSIS — J449 Chronic obstructive pulmonary disease, unspecified: Secondary | ICD-10-CM | POA: Diagnosis not present

## 2012-09-18 DIAGNOSIS — Z79899 Other long term (current) drug therapy: Secondary | ICD-10-CM | POA: Diagnosis not present

## 2012-09-18 DIAGNOSIS — Z125 Encounter for screening for malignant neoplasm of prostate: Secondary | ICD-10-CM | POA: Diagnosis not present

## 2012-09-22 ENCOUNTER — Telehealth: Payer: Self-pay | Admitting: Pulmonary Disease

## 2012-09-22 DIAGNOSIS — J449 Chronic obstructive pulmonary disease, unspecified: Secondary | ICD-10-CM

## 2012-09-22 MED ORDER — TIOTROPIUM BROMIDE MONOHYDRATE 18 MCG IN CAPS: 18.0000 ug | ORAL_CAPSULE | Freq: Every day | RESPIRATORY_TRACT | Status: DC

## 2012-09-22 MED ORDER — FLUTICASONE-SALMETEROL 250-50 MCG/DOSE IN AEPB: 1.0000 | INHALATION_SPRAY | Freq: Every day | RESPIRATORY_TRACT | Status: DC

## 2012-09-22 NOTE — Telephone Encounter (Signed)
Duplicate message.  Samples left at [employer] per previous message.

## 2012-09-22 NOTE — Telephone Encounter (Signed)
1 sample of Advair 250/50 and 1 sample of Spiriva left for the pt to pick up today.

## 2012-10-02 ENCOUNTER — Telehealth: Payer: Self-pay | Admitting: Pulmonary Disease

## 2012-10-02 NOTE — Telephone Encounter (Signed)
Called back to check on status of samples & setting up pulmonary rehab.  [name]

## 2012-10-02 NOTE — Telephone Encounter (Signed)
Pt wife aware that we have some samples ready to be picked up. She is also aware that per out PCC's that pulmonary rehab is booked out 6-8 weeks. The number was given to her to call if she doesn't hear anything by the end of the month. She verbalized understanding.

## 2012-10-05 DIAGNOSIS — N182 Chronic kidney disease, stage 2 (mild): Secondary | ICD-10-CM | POA: Diagnosis not present

## 2012-10-05 DIAGNOSIS — D509 Iron deficiency anemia, unspecified: Secondary | ICD-10-CM | POA: Diagnosis not present

## 2012-10-05 DIAGNOSIS — I10 Essential (primary) hypertension: Secondary | ICD-10-CM | POA: Diagnosis not present

## 2012-10-05 DIAGNOSIS — E871 Hypo-osmolality and hyponatremia: Secondary | ICD-10-CM | POA: Diagnosis not present

## 2012-10-05 DIAGNOSIS — E870 Hyperosmolality and hypernatremia: Secondary | ICD-10-CM | POA: Diagnosis not present

## 2012-10-05 DIAGNOSIS — J449 Chronic obstructive pulmonary disease, unspecified: Secondary | ICD-10-CM | POA: Diagnosis not present

## 2012-10-05 DIAGNOSIS — E875 Hyperkalemia: Secondary | ICD-10-CM | POA: Diagnosis not present

## 2012-10-05 DIAGNOSIS — N390 Urinary tract infection, site not specified: Secondary | ICD-10-CM | POA: Diagnosis not present

## 2012-10-06 ENCOUNTER — Emergency Department (HOSPITAL_COMMUNITY): Payer: No Typology Code available for payment source

## 2012-10-06 ENCOUNTER — Emergency Department (HOSPITAL_COMMUNITY)
Admission: EM | Admit: 2012-10-06 | Discharge: 2012-10-07 | Disposition: A | Discharge status: Home / Self Care | Payer: No Typology Code available for payment source | Attending: Emergency Medicine | Admitting: Emergency Medicine

## 2012-10-06 ENCOUNTER — Encounter (HOSPITAL_COMMUNITY): Payer: Self-pay | Admitting: Adult Health

## 2012-10-06 DIAGNOSIS — T148XXA Other injury of unspecified body region, initial encounter: Secondary | ICD-10-CM | POA: Diagnosis not present

## 2012-10-06 DIAGNOSIS — E785 Hyperlipidemia, unspecified: Secondary | ICD-10-CM | POA: Diagnosis not present

## 2012-10-06 DIAGNOSIS — S3981XA Other specified injuries of abdomen, initial encounter: Secondary | ICD-10-CM | POA: Diagnosis not present

## 2012-10-06 DIAGNOSIS — S4980XA Other specified injuries of shoulder and upper arm, unspecified arm, initial encounter: Secondary | ICD-10-CM | POA: Insufficient documentation

## 2012-10-06 DIAGNOSIS — M542 Cervicalgia: Secondary | ICD-10-CM | POA: Diagnosis not present

## 2012-10-06 DIAGNOSIS — Y9389 Activity, other specified: Secondary | ICD-10-CM | POA: Insufficient documentation

## 2012-10-06 DIAGNOSIS — Z8719 Personal history of other diseases of the digestive system: Secondary | ICD-10-CM | POA: Diagnosis not present

## 2012-10-06 DIAGNOSIS — K219 Gastro-esophageal reflux disease without esophagitis: Secondary | ICD-10-CM | POA: Insufficient documentation

## 2012-10-06 DIAGNOSIS — I2510 Atherosclerotic heart disease of native coronary artery without angina pectoris: Secondary | ICD-10-CM | POA: Insufficient documentation

## 2012-10-06 DIAGNOSIS — R071 Chest pain on breathing: Secondary | ICD-10-CM | POA: Diagnosis not present

## 2012-10-06 DIAGNOSIS — Z8701 Personal history of pneumonia (recurrent): Secondary | ICD-10-CM | POA: Insufficient documentation

## 2012-10-06 DIAGNOSIS — Z860100 Personal history of colon polyps, unspecified: Secondary | ICD-10-CM | POA: Insufficient documentation

## 2012-10-06 DIAGNOSIS — Y92410 Unspecified street and highway as the place of occurrence of the external cause: Secondary | ICD-10-CM | POA: Insufficient documentation

## 2012-10-06 DIAGNOSIS — Z951 Presence of aortocoronary bypass graft: Secondary | ICD-10-CM | POA: Insufficient documentation

## 2012-10-06 DIAGNOSIS — S46909A Unspecified injury of unspecified muscle, fascia and tendon at shoulder and upper arm level, unspecified arm, initial encounter: Secondary | ICD-10-CM | POA: Insufficient documentation

## 2012-10-06 DIAGNOSIS — N183 Chronic kidney disease, stage 3 unspecified: Secondary | ICD-10-CM | POA: Insufficient documentation

## 2012-10-06 DIAGNOSIS — J449 Chronic obstructive pulmonary disease, unspecified: Secondary | ICD-10-CM | POA: Diagnosis not present

## 2012-10-06 DIAGNOSIS — K2270 Barrett's esophagus without dysplasia: Secondary | ICD-10-CM | POA: Insufficient documentation

## 2012-10-06 DIAGNOSIS — S20219A Contusion of unspecified front wall of thorax, initial encounter: Secondary | ICD-10-CM | POA: Diagnosis not present

## 2012-10-06 DIAGNOSIS — I10 Essential (primary) hypertension: Secondary | ICD-10-CM | POA: Insufficient documentation

## 2012-10-06 DIAGNOSIS — Z7982 Long term (current) use of aspirin: Secondary | ICD-10-CM | POA: Diagnosis not present

## 2012-10-06 DIAGNOSIS — Z8709 Personal history of other diseases of the respiratory system: Secondary | ICD-10-CM | POA: Diagnosis not present

## 2012-10-06 DIAGNOSIS — Z87891 Personal history of nicotine dependence: Secondary | ICD-10-CM | POA: Diagnosis not present

## 2012-10-06 DIAGNOSIS — Z79899 Other long term (current) drug therapy: Secondary | ICD-10-CM | POA: Insufficient documentation

## 2012-10-06 DIAGNOSIS — M25519 Pain in unspecified shoulder: Secondary | ICD-10-CM | POA: Diagnosis not present

## 2012-10-06 DIAGNOSIS — Z8679 Personal history of other diseases of the circulatory system: Secondary | ICD-10-CM | POA: Insufficient documentation

## 2012-10-06 DIAGNOSIS — Z8711 Personal history of peptic ulcer disease: Secondary | ICD-10-CM | POA: Insufficient documentation

## 2012-10-06 DIAGNOSIS — S0993XA Unspecified injury of face, initial encounter: Secondary | ICD-10-CM | POA: Insufficient documentation

## 2012-10-06 DIAGNOSIS — Z862 Personal history of diseases of the blood and blood-forming organs and certain disorders involving the immune mechanism: Secondary | ICD-10-CM | POA: Diagnosis not present

## 2012-10-06 DIAGNOSIS — J438 Other emphysema: Secondary | ICD-10-CM | POA: Diagnosis not present

## 2012-10-06 DIAGNOSIS — J4489 Other specified chronic obstructive pulmonary disease: Secondary | ICD-10-CM | POA: Insufficient documentation

## 2012-10-06 DIAGNOSIS — Z8639 Personal history of other endocrine, nutritional and metabolic disease: Secondary | ICD-10-CM | POA: Insufficient documentation

## 2012-10-06 DIAGNOSIS — S298XXA Other specified injuries of thorax, initial encounter: Secondary | ICD-10-CM | POA: Diagnosis not present

## 2012-10-06 DIAGNOSIS — R079 Chest pain, unspecified: Secondary | ICD-10-CM | POA: Diagnosis not present

## 2012-10-06 LAB — BASIC METABOLIC PANEL
Calcium: 9.6 mg/dL (ref 8.4–10.5)
Creatinine, Ser: 0.79 mg/dL (ref 0.50–1.35)
GFR calc non Af Amer: 86 mL/min — ABNORMAL LOW (ref >90)
Glucose, Bld: 121 mg/dL — ABNORMAL HIGH (ref 70–99)
Sodium: 141 mEq/L (ref 135–145)

## 2012-10-06 LAB — CBC WITH DIFFERENTIAL/PLATELET
Basophils Absolute: 0.1 10*3/uL (ref 0.0–0.1)
Eosinophils Absolute: 0.2 10*3/uL (ref 0.0–0.7)
Eosinophils Relative: 2 % (ref 0–5)
Lymphs Abs: 1.3 10*3/uL (ref 0.7–4.0)
MCH: 32.6 pg (ref 26.0–34.0)
MCV: 94.8 fL (ref 78.0–100.0)
Monocytes Absolute: 0.9 10*3/uL (ref 0.1–1.0)
Platelets: 267 10*3/uL (ref 150–400)
RDW: 12.4 % (ref 11.5–15.5)

## 2012-10-06 LAB — POCT I-STAT TROPONIN I

## 2012-10-06 MED ORDER — ACETAMINOPHEN 325 MG PO TABS
650.0000 mg | ORAL_TABLET | Freq: Once | ORAL | Status: AC
  Administered 2012-10-06: 650 mg via ORAL
  Filled 2012-10-06: qty 2

## 2012-10-06 MED ORDER — HYDROCODONE-ACETAMINOPHEN 5-325 MG PO TABS: 1.0000 | ORAL_TABLET | ORAL | Status: DC | PRN

## 2012-10-06 MED ORDER — OXYCODONE-ACETAMINOPHEN 5-325 MG PO TABS
2.0000 | ORAL_TABLET | Freq: Once | ORAL | Status: AC
  Administered 2012-10-06: 2 via ORAL
  Filled 2012-10-06: qty 2
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
## 2012-10-20 DIAGNOSIS — I2510 Atherosclerotic heart disease of native coronary artery without angina pectoris: Secondary | ICD-10-CM | POA: Diagnosis not present

## 2012-10-20 DIAGNOSIS — R0789 Other chest pain: Secondary | ICD-10-CM | POA: Diagnosis not present

## 2012-10-20 DIAGNOSIS — M549 Dorsalgia, unspecified: Secondary | ICD-10-CM | POA: Diagnosis not present

## 2012-10-20 DIAGNOSIS — S139XXA Sprain of joints and ligaments of unspecified parts of neck, initial encounter: Secondary | ICD-10-CM | POA: Diagnosis not present

## 2012-10-20 DIAGNOSIS — J449 Chronic obstructive pulmonary disease, unspecified: Secondary | ICD-10-CM | POA: Diagnosis not present

## 2012-10-25 ENCOUNTER — Ambulatory Visit (HOSPITAL_COMMUNITY): Payer: Medicare Other

## 2012-10-25 ENCOUNTER — Telehealth: Payer: Self-pay | Admitting: Pulmonary Disease

## 2012-10-25 DIAGNOSIS — J449 Chronic obstructive pulmonary disease, unspecified: Secondary | ICD-10-CM

## 2012-10-25 MED ORDER — FLUTICASONE-SALMETEROL 250-50 MCG/DOSE IN AEPB: 1.0000 | INHALATION_SPRAY | Freq: Every day | RESPIRATORY_TRACT | Status: DC

## 2012-10-25 MED ORDER — TIOTROPIUM BROMIDE MONOHYDRATE 18 MCG IN CAPS: 18.0000 ug | ORAL_CAPSULE | Freq: Every day | RESPIRATORY_TRACT | Status: DC

## 2012-10-25 NOTE — Telephone Encounter (Signed)
Pt is aware that we have samples ready for him to pick up.

## 2012-10-31 ENCOUNTER — Telehealth: Payer: Self-pay | Admitting: Pulmonary Disease

## 2012-10-31 DIAGNOSIS — J449 Chronic obstructive pulmonary disease, unspecified: Secondary | ICD-10-CM

## 2012-10-31 DIAGNOSIS — N182 Chronic kidney disease, stage 2 (mild): Secondary | ICD-10-CM | POA: Diagnosis not present

## 2012-10-31 DIAGNOSIS — D649 Anemia, unspecified: Secondary | ICD-10-CM | POA: Diagnosis not present

## 2012-10-31 MED ORDER — TIOTROPIUM BROMIDE MONOHYDRATE 18 MCG IN CAPS: 18.0000 ug | ORAL_CAPSULE | Freq: Every day | RESPIRATORY_TRACT | Status: DC

## 2012-10-31 MED ORDER — FLUTICASONE-SALMETEROL 250-50 MCG/DOSE IN AEPB: 1.0000 | INHALATION_SPRAY | Freq: Every day | RESPIRATORY_TRACT | Status: DC

## 2012-10-31 NOTE — Telephone Encounter (Signed)
Spoke with pt and notified that samples of Advair and Spiriva  Were left at front desk for pick up.  Also gave pt forms for pt assistance for both companies.

## 2012-11-06 ENCOUNTER — Ambulatory Visit: Payer: Medicare Other | Admitting: Adult Health

## 2012-11-07 ENCOUNTER — Other Ambulatory Visit: Payer: Self-pay | Admitting: Adult Health

## 2012-11-08 ENCOUNTER — Telehealth: Payer: Self-pay | Admitting: Pulmonary Disease

## 2012-11-08 DIAGNOSIS — J449 Chronic obstructive pulmonary disease, unspecified: Secondary | ICD-10-CM

## 2012-11-08 MED ORDER — TIOTROPIUM BROMIDE MONOHYDRATE 18 MCG IN CAPS: 18.0000 ug | ORAL_CAPSULE | Freq: Every day | RESPIRATORY_TRACT | Status: DC

## 2012-11-08 NOTE — Telephone Encounter (Signed)
Rx was sent to [facility] with spouse and notified her that this was done Nothing further needed

## 2012-11-09 ENCOUNTER — Telehealth: Payer: Self-pay | Admitting: Pulmonary Disease

## 2012-11-09 DIAGNOSIS — J449 Chronic obstructive pulmonary disease, unspecified: Secondary | ICD-10-CM

## 2012-11-09 NOTE — Telephone Encounter (Signed)
lmomtcb x1 fro diane

## 2012-11-10 NOTE — Telephone Encounter (Signed)
Spoke with diane from [employer].  New order has been placed and i spoke with rhonda and she will go in and fax the information.  Nothing further is needed.

## 2012-11-10 NOTE — Telephone Encounter (Signed)
Dianne w/ [facility] calling back.

## 2012-11-10 NOTE — Telephone Encounter (Signed)
LMTCBx2. Ai Sonnenfeld, CMA  

## 2012-11-15 ENCOUNTER — Telehealth: Payer: Self-pay | Admitting: Pulmonary Disease

## 2012-11-15 DIAGNOSIS — J449 Chronic obstructive pulmonary disease, unspecified: Secondary | ICD-10-CM

## 2012-11-15 NOTE — Telephone Encounter (Signed)
lmomtcb x1 for [name]

## 2012-11-16 NOTE — Telephone Encounter (Signed)
He has spirometry form 5/10 - prob in centricity - pl locate If not, ok to place order for spirometry -pre/post

## 2012-11-16 NOTE — Telephone Encounter (Signed)
[name] says the pt will need a PFT for COPD staging beofre Medicare will pay for any rehab. Dr. Vassie Loll, pls advise if okay to place an order for PFT.

## 2012-11-16 NOTE — Telephone Encounter (Signed)
LMOMTCB x1 for [name]. Pt had PFT 03/10/2009. Is this okay to use?

## 2012-11-17 NOTE — Telephone Encounter (Signed)
Diane returned triage call and says that they have discussed the PFT situation and the PFT can be no more than 75 years old. We will contact the pt to have this set up. LMTCB x 1 for the pt. Need to know if the pt would like this done at [facility] or [facility]. [facility] would be closer for the pt.

## 2012-11-17 NOTE — Telephone Encounter (Signed)
LMTCB x 1 for Diane at [facility]. I believe the spirometry will be too old and must be within the past year. Will need to clarify;

## 2012-11-20 NOTE — Telephone Encounter (Signed)
Pt made aware and wants PFT done in this office and not [facility]. PFT has been scheduled for Fri/. 12/08/12 @ 4am. LMTCB x 1 for Diane so she is aware.

## 2012-11-21 NOTE — Telephone Encounter (Signed)
Diane is aware and will call for results after test has been done if she does not receive a copy.

## 2012-11-27 ENCOUNTER — Inpatient Hospital Stay (HOSPITAL_COMMUNITY)
Admission: AD | Admit: 2012-11-27 | Discharge: 2012-12-04 | DRG: 193 | Disposition: A | Discharge status: Home / Self Care | Payer: Medicare Other | Source: Ambulatory Visit | Attending: Pulmonary Disease | Admitting: Pulmonary Disease

## 2012-11-27 ENCOUNTER — Encounter (HOSPITAL_COMMUNITY): Payer: Self-pay | Admitting: *Deleted

## 2012-11-27 ENCOUNTER — Encounter: Payer: Self-pay | Admitting: Adult Health

## 2012-11-27 ENCOUNTER — Ambulatory Visit (INDEPENDENT_AMBULATORY_CARE_PROVIDER_SITE_OTHER): Payer: Medicare Other | Admitting: Adult Health

## 2012-11-27 ENCOUNTER — Inpatient Hospital Stay (HOSPITAL_COMMUNITY): Payer: Medicare Other

## 2012-11-27 VITALS — BP 130/70 | HR 112 | Temp 97.1°F | Ht 71.0 in | Wt 149.0 lb

## 2012-11-27 DIAGNOSIS — I471 Supraventricular tachycardia, unspecified: Secondary | ICD-10-CM

## 2012-11-27 DIAGNOSIS — E785 Hyperlipidemia, unspecified: Secondary | ICD-10-CM

## 2012-11-27 DIAGNOSIS — J9610 Chronic respiratory failure, unspecified whether with hypoxia or hypercapnia: Secondary | ICD-10-CM | POA: Diagnosis present

## 2012-11-27 DIAGNOSIS — Z860101 Personal history of adenomatous and serrated colon polyps: Secondary | ICD-10-CM

## 2012-11-27 DIAGNOSIS — J449 Chronic obstructive pulmonary disease, unspecified: Secondary | ICD-10-CM

## 2012-11-27 DIAGNOSIS — J189 Pneumonia, unspecified organism: Secondary | ICD-10-CM

## 2012-11-27 DIAGNOSIS — R0902 Hypoxemia: Secondary | ICD-10-CM | POA: Diagnosis present

## 2012-11-27 DIAGNOSIS — Z87891 Personal history of nicotine dependence: Secondary | ICD-10-CM

## 2012-11-27 DIAGNOSIS — R0609 Other forms of dyspnea: Secondary | ICD-10-CM | POA: Diagnosis not present

## 2012-11-27 DIAGNOSIS — I498 Other specified cardiac arrhythmias: Secondary | ICD-10-CM | POA: Diagnosis present

## 2012-11-27 DIAGNOSIS — K5900 Constipation, unspecified: Secondary | ICD-10-CM | POA: Diagnosis present

## 2012-11-27 DIAGNOSIS — Z951 Presence of aortocoronary bypass graft: Secondary | ICD-10-CM

## 2012-11-27 DIAGNOSIS — R053 Chronic cough: Secondary | ICD-10-CM

## 2012-11-27 DIAGNOSIS — Z79899 Other long term (current) drug therapy: Secondary | ICD-10-CM | POA: Diagnosis not present

## 2012-11-27 DIAGNOSIS — Z86010 Personal history of colonic polyps: Secondary | ICD-10-CM

## 2012-11-27 DIAGNOSIS — R05 Cough: Secondary | ICD-10-CM

## 2012-11-27 DIAGNOSIS — K219 Gastro-esophageal reflux disease without esophagitis: Secondary | ICD-10-CM | POA: Diagnosis present

## 2012-11-27 DIAGNOSIS — N183 Chronic kidney disease, stage 3 unspecified: Secondary | ICD-10-CM | POA: Diagnosis present

## 2012-11-27 DIAGNOSIS — J9620 Acute and chronic respiratory failure, unspecified whether with hypoxia or hypercapnia: Secondary | ICD-10-CM

## 2012-11-27 DIAGNOSIS — J438 Other emphysema: Secondary | ICD-10-CM | POA: Diagnosis not present

## 2012-11-27 DIAGNOSIS — J441 Chronic obstructive pulmonary disease with (acute) exacerbation: Secondary | ICD-10-CM

## 2012-11-27 DIAGNOSIS — I129 Hypertensive chronic kidney disease with stage 1 through stage 4 chronic kidney disease, or unspecified chronic kidney disease: Secondary | ICD-10-CM | POA: Diagnosis present

## 2012-11-27 DIAGNOSIS — I2510 Atherosclerotic heart disease of native coronary artery without angina pectoris: Secondary | ICD-10-CM

## 2012-11-27 DIAGNOSIS — Z9981 Dependence on supplemental oxygen: Secondary | ICD-10-CM

## 2012-11-27 DIAGNOSIS — J4489 Other specified chronic obstructive pulmonary disease: Secondary | ICD-10-CM

## 2012-11-27 DIAGNOSIS — I10 Essential (primary) hypertension: Secondary | ICD-10-CM

## 2012-11-27 LAB — COMPREHENSIVE METABOLIC PANEL
AST: 17 U/L (ref 0–37)
Albumin: 3.9 g/dL (ref 3.5–5.2)
Alkaline Phosphatase: 78 U/L (ref 39–117)
CO2: 24 mEq/L (ref 19–32)
Chloride: 98 mEq/L (ref 96–112)
Creatinine, Ser: 0.79 mg/dL (ref 0.50–1.35)
GFR calc non Af Amer: 86 mL/min — ABNORMAL LOW (ref >90)
Potassium: 3.7 mEq/L (ref 3.5–5.1)
Total Bilirubin: 1.3 mg/dL — ABNORMAL HIGH (ref 0.3–1.2)

## 2012-11-27 LAB — CBC WITH DIFFERENTIAL/PLATELET
Eosinophils Absolute: 0 10*3/uL (ref 0.0–0.7)
Eosinophils Relative: 0 % (ref 0–5)
HCT: 49.7 % (ref 39.0–52.0)
Hemoglobin: 17 g/dL (ref 13.0–17.0)
Lymphocytes Relative: 4 % — ABNORMAL LOW (ref 12–46)
Lymphs Abs: 0.8 10*3/uL (ref 0.7–4.0)
MCH: 32.5 pg (ref 26.0–34.0)
MCV: 95 fL (ref 78.0–100.0)
Monocytes Absolute: 0.7 10*3/uL (ref 0.1–1.0)
Monocytes Relative: 3 % (ref 3–12)
RBC: 5.23 MIL/uL (ref 4.22–5.81)
WBC: 22 10*3/uL — ABNORMAL HIGH (ref 4.0–10.5)

## 2012-11-27 LAB — BLOOD GAS, ARTERIAL
Acid-Base Excess: 0.3 mmol/L (ref 0.0–2.0)
Bicarbonate: 23.2 mEq/L (ref 20.0–24.0)
Drawn by: 295031
O2 Content: 3 L/min
pCO2 arterial: 34.2 mmHg — ABNORMAL LOW (ref 35.0–45.0)
pO2, Arterial: 55.3 mmHg — ABNORMAL LOW (ref 80.0–100.0)

## 2012-11-27 LAB — URINALYSIS, ROUTINE W REFLEX MICROSCOPIC
Bilirubin Urine: NEGATIVE
Leukocytes, UA: NEGATIVE
Nitrite: NEGATIVE
Specific Gravity, Urine: 1.015 (ref 1.005–1.030)
Urobilinogen, UA: 0.2 mg/dL (ref 0.0–1.0)
pH: 6 (ref 5.0–8.0)

## 2012-11-27 LAB — LACTIC ACID, PLASMA: Lactic Acid, Venous: 2.3 mmol/L — ABNORMAL HIGH (ref 0.5–2.2)

## 2012-11-27 LAB — TROPONIN I: Troponin I: <0.3 ng/mL (ref <0.30)

## 2012-11-27 MED ORDER — HEPARIN SODIUM (PORCINE) 5000 UNIT/ML IJ SOLN
5000.0000 [IU] | Freq: Three times a day (TID) | INTRAMUSCULAR | Status: DC
  Administered 2012-11-27 – 2012-12-04 (×20): 5000 [IU] via SUBCUTANEOUS
  Filled 2012-11-27 (×23): qty 1

## 2012-11-27 MED ORDER — LEVALBUTEROL HCL 0.63 MG/3ML IN NEBU
0.6300 mg | INHALATION_SOLUTION | RESPIRATORY_TRACT | Status: DC | PRN
  Filled 2012-11-27: qty 3

## 2012-11-27 MED ORDER — LEVOFLOXACIN IN D5W 750 MG/150ML IV SOLN
750.0000 mg | INTRAVENOUS | Status: DC
  Administered 2012-11-27 – 2012-12-03 (×7): 750 mg via INTRAVENOUS
  Filled 2012-11-27 (×8): qty 150

## 2012-11-27 MED ORDER — PANTOPRAZOLE SODIUM 40 MG PO TBEC
40.0000 mg | DELAYED_RELEASE_TABLET | Freq: Every day | ORAL | Status: DC
  Administered 2012-11-27 – 2012-12-04 (×8): 40 mg via ORAL
  Filled 2012-11-27 (×8): qty 1

## 2012-11-27 MED ORDER — SODIUM CHLORIDE 0.9 % IJ SOLN: 3.0000 mL | INTRAMUSCULAR | Status: DC | PRN

## 2012-11-27 MED ORDER — METHYLPREDNISOLONE SODIUM SUCC 125 MG IJ SOLR
80.0000 mg | Freq: Two times a day (BID) | INTRAMUSCULAR | Status: DC
  Administered 2012-11-27 – 2012-11-28 (×2): 80 mg via INTRAVENOUS
  Filled 2012-11-27 (×4): qty 1.28

## 2012-11-27 MED ORDER — LEVALBUTEROL HCL 0.63 MG/3ML IN NEBU
0.6300 mg | INHALATION_SOLUTION | Freq: Four times a day (QID) | RESPIRATORY_TRACT | Status: DC
  Administered 2012-11-27 – 2012-12-04 (×27): 0.63 mg via RESPIRATORY_TRACT
  Filled 2012-11-27 (×34): qty 3

## 2012-11-27 MED ORDER — HYDROCODONE-ACETAMINOPHEN 5-325 MG PO TABS
1.0000 | ORAL_TABLET | ORAL | Status: DC | PRN
  Administered 2012-11-27 – 2012-12-04 (×6): 1 via ORAL
  Filled 2012-11-27 (×7): qty 1

## 2012-11-27 MED ORDER — SODIUM CHLORIDE 0.9 % IJ SOLN
3.0000 mL | Freq: Two times a day (BID) | INTRAMUSCULAR | Status: DC
  Administered 2012-11-27 – 2012-11-30 (×4): 3 mL via INTRAVENOUS
  Administered 2012-11-30: 21:00:00 via INTRAVENOUS
  Administered 2012-12-01: 3 mL via INTRAVENOUS
  Administered 2012-12-01: 23:00:00 via INTRAVENOUS
  Administered 2012-12-02 – 2012-12-03 (×3): 3 mL via INTRAVENOUS

## 2012-11-27 MED ORDER — SODIUM CHLORIDE 0.9 % IV SOLN
250.0000 mL | INTRAVENOUS | Status: DC | PRN
  Administered 2012-11-27 – 2012-11-29 (×2): 250 mL via INTRAVENOUS

## 2012-11-27 MED ORDER — ACETAMINOPHEN 325 MG PO TABS
650.0000 mg | ORAL_TABLET | ORAL | Status: DC | PRN
  Administered 2012-11-28 – 2012-12-04 (×10): 650 mg via ORAL
  Filled 2012-11-27 (×9): qty 2
  Filled 2012-11-27: qty 1
  Filled 2012-11-27: qty 2

## 2012-11-27 MED ORDER — IPRATROPIUM BROMIDE 0.02 % IN SOLN
0.5000 mg | Freq: Four times a day (QID) | RESPIRATORY_TRACT | Status: DC
  Administered 2012-11-27 – 2012-12-04 (×27): 0.5 mg via RESPIRATORY_TRACT
  Filled 2012-11-27 (×27): qty 2.5

## 2012-11-27 NOTE — Assessment & Plan Note (Signed)
Admit to Hospital  

## 2012-11-27 NOTE — Patient Instructions (Signed)
Admit to hospital 

## 2012-11-27 NOTE — Progress Notes (Signed)
Subjective:    Patient ID: MASSIE MEES, male    DOB: [birth year], 77 y.o.   MRN: [id number] HPI PMD - [name]   76/M, ex- smoker- severe COPD on nocturnal O2 since 5/11 & cough.  5/10 PFTs >> severe obstruction, FEV1 39%, no BD response  He developed a chronic cough.since his heart surgery by [name] in 6/09  Barrett's esophagus 3 cm noted to be stable by [name].  H/o sinus surgery, tonsillectomy  CT chest 4/09 severe emphysema, scattered sub-cm nodules stable since 2005  Hosp adm in 2/12 >> for cough syncope - admitted to tele but transferred to 2900 for another syncopal event  CT chest 11/16/10 neg PE, stable nodules, small RLL infx Rxed for HCAP with cipro & zosyn  Modified Ba swallow nml  Ct angio 2/12 did not show any suspicious nodules   11/04/2011 Started on Daliresp by Primary MD - stopped 4/13, makes him 'mean' , has lost wt ,  4/13 ono on RA showed only 2 min desatn < 88% -has been off O2   08/23/2012 - happy b'day!   07/03/2012 Acute OV  >> augmentin, pred, trial of tudorza  Acute OV - 10/13 ([facility]) - CXR - bibasal scarring, nipple shadow clarified with markers He did not see improvement in his breathing with Spiriva, and was given a trial of tudorza at the last visit. He did not see any change in his breathing with any of these therapies >> trial of prednisone C/o good and bad days w/ breathing, occasional cough, wheezing, chest tx. uses oxygen PRN during day and everynight at bedtime No hemoptysis or chest pain . No edema.   11/27/2012 Acute OV  Complains of Lots of wheezing and SOB x 2 day. Reports chills.  Complains of cough, congestion and thick mucus. Green thick mucus.  Denies chest pain or chest tightness. No fever, hemotpysis, or fever. Some lower ext swelling.  On arrival to office 83% on 4 l/m O2.  Will require admission to Hospital .     Review of Systems Constitutional:   No  weight loss, night sweats,  Fevers, chills, fatigue, or  lassitude.  HEENT:    No headaches,  Difficulty swallowing,  Tooth/dental problems, or  Sore throat,                No sneezing, itching, ear ache,  +nasal congestion, post nasal drip,   CV:  No chest pain,  Orthopnea, PND, swelling in lower extremities, anasarca, dizziness, palpitations, syncope.   GI  No heartburn, indigestion, abdominal pain, nausea, vomiting, diarrhea, change in bowel habits, loss of appetite, bloody stools.   Resp:    No coughing up of blood.    No chest wall deformity  Skin: no rash or lesions.  GU: no dysuria, change in color of urine, no urgency or frequency.  No flank pain, no hematuria   MS:  No joint pain or swelling.  No decreased range of motion.  No back pain.  Psych:  No change in mood or affect. No depression or anxiety.  No memory loss.         Objective:   Physical Exam  Gen. Pleasant, tachypneic, increased WOB  ENT - no lesions, no post nasal drip Neck: No JVD, no thyromegaly, no carotid bruits Lungs: no use of accessory muscles, no dullness to percussion,  Scattered rhonchi  Cardiovascular: Rhythm regular, heart sounds  normal, no murmurs or gallops,  tr -1 + peripheral edema Musculoskeletal: No  deformities, no cyanosis or clubbing        Assessment & Plan:

## 2012-11-27 NOTE — Progress Notes (Signed)
Labs and images reviewed; continue current medication regimen.   The patient with a headache    Vicodin prescribed

## 2012-11-27 NOTE — H&P (Signed)
PULMONARY  / CRITICAL CARE MEDICINE  Name: [name] MRN: [id number] DOB: 1935-11-25    ADMISSION DATE:  11/27/2012     CHIEF COMPLAINT:  "Cant breath"   BRIEF PATIENT DESCRIPTION: 76 yowm quit smoking 14 y pta with severe COPD-O2 dependent w/ 1 day hx of acute onset increased dyspnea and productive cough w/ purulent sputum. Found to be hypoxic on arrival to office despite 4 l/m of O2.   SIGNIFICANT EVENTS / STUDIES:    LINES / TUBES:   CULTURES: 2/10  Sputum >  ANTIBIOTICS: Levaquin 2/10 >>   HISTORY OF PRESENT ILLNESS:   76/M, ex- smoker- severe COPD on nocturnal O2 since 5/11 & cough.  5/10 PFTs > FEV1 39%, no BD response  Barrett's esophagus 3 cm noted to be stable by [name].  H/o sinus surgery, tonsillectomy  CT chest 01/2008 severe emphysema, scattered sub-cm nodules stable since 2005  Hosp adm in 11/2010 >> for cough syncope - admitted to tele but transferred to 2900 for another syncopal event  CT chest 11/16/10 neg PE, stable nodules, small RLL infx Rxed for HCAP with cipro & zosyn  Modified Ba swallow nml  Ct angio 2/12 did not show any suspicious nodules  11/04/2011 Started on Daliresp by Primary MD - stopped 4/13, makes him 'mean' , has lost wt ,   Presented to pulmonary clinic 11/27/2012 w/ Lots of wheezing and SOB x 2 day. Reports chills.  Complains of cough, congestion and thick mucus. Green thick mucus.  Denies chest pain or chest tightness.  No fever, hemotpysis, or fever. Some lower ext swelling.  On arrival to office 83% on 4 l/m O2 so rec admit  Previously Sleeping ok without nocturnal  or early am exacerbation  of respiratory  c/o's or need for noct saba. Also denies any obvious fluctuation of symptoms with weather or environmental changes or other aggravating or alleviating factors except as outlined above .    PAST MEDICAL HISTORY :  Past Medical History  Diagnosis Date  . Hypertension   . Hyperlipidemia     excellent control  . Chronic  kidney disease, stage III (moderate)   . Barrett's esophagus     frequent surveillance EGDs by Dr. Jena Gauss  . Chronic obstructive pulmonary disease     severe emphysema  . Cough, persistent   . Hyperkalemia     peak of 6.2 in 2009  . Colonic polyp     resected 2007 via colonoscope  . Diverticulosis   . Pneumonia     10/2010  . Coronary artery disease     CABG in 03/2008 for severe left main disease with critical RCA stenosis; EF of 45-50% with inferior wall hypokinesis; echocardiogram in 10/2008-no change in LV function; stress nuclear-conflicting information in report indicating inferior reversibility but inferior scar  . Gastroesophageal reflux disease   . Peptic ulcer disease     perforation and surgical repair at [facility] in 1999; treated for positive Helicobacter pylori  . History of orthostatic hypotension     noted following CABG surgery  . COPD (chronic obstructive pulmonary disease)    Past Surgical History  Procedure Laterality Date  . Laparoscopic gastrotomy w/ repair of ulcer      1999  . Coronary artery bypass graft  03/2008  . Colon surgery    . Cardiac surgery     Prior to Admission medications   Medication Sig Start Date End Date Taking? Authorizing Provider  albuterol (PROVENTIL) (2.5 MG/3ML) 0.083% nebulizer  solution Take 3 mLs (2.5 mg total) by nebulization every 6 (six) hours as needed. DX:  496 06/22/12   Oretha Milch, MD  amLODipine (NORVASC) 10 MG tablet Take 10 mg by mouth daily.  12/15/10   Historical Provider, MD  aspirin 81 MG tablet Take 81 mg by mouth daily.      Historical Provider, MD  Docusate Calcium ([employer] STOOL SOFTENER PO) Take 1 tablet by mouth daily as needed. Stool soft    Historical Provider, MD  Fluticasone-Salmeterol (ADVAIR) 250-50 MCG/DOSE AEPB Inhale 1 puff into the lungs daily. 10/31/12 10/31/13  Oretha Milch, MD  furosemide (LASIX) 20 MG tablet Take 1 tablet by mouth Once daily as needed. 02/01/11   Historical Provider, MD  hydroxypropyl  methylcellulose (ISOPTO TEARS) 2.5 % ophthalmic solution Place 1 drop into both eyes daily as needed. For redness    Historical Provider, MD  Omega-3 Fatty Acids (EQL FISH OIL) 1000 MG CAPS Take 2 capsules (2,000 mg total) by mouth daily. 01/13/11   Kathlen Brunswick, MD  omeprazole (PRILOSEC) 20 MG capsule Take 20 mg by mouth daily. 30 minutes before breakfast    Historical Provider, MD  polyethylene glycol powder ([employer]S PURELAX) powder Take 17 g by mouth daily. Take everyday per patient    Historical Provider, MD  pravastatin (PRAVACHOL) 80 MG tablet TAKE 1 TABLET BY MOUTH AT BEDTIME 04/29/12   Kathlen Brunswick, MD  PROAIR HFA 108 (90 BASE) MCG/ACT inhaler INHALE 2 PUFFS BY MOUTH EVERY 4 HOURS AS NEEDED FOR SHORTNESS OF Zella Ball Wilfred Lacy 03/16/12   Oretha Milch, MD  tiotropium (SPIRIVA) 18 MCG inhalation capsule Place 1 capsule (18 mcg total) into inhaler and inhale daily. 11/08/12   Oretha Milch, MD   No Known Allergies  FAMILY HISTORY:  Family History  Problem Relation Age of Onset  . Heart disease Mother   . Heart disease Father    SOCIAL HISTORY:  reports that he quit smoking about 14 years ago. His smoking use included Cigarettes, Pipe, and Cigars. He has a 20 pack-year smoking history. He has never used smokeless tobacco. He reports that  drinks alcohol. He reports that he does not use illicit drugs.  REVIEW OF SYSTEMS:   Constitutional:   No  weight loss, night sweats,  Fevers,  +chills, fatigue, or  lassitude.  HEENT:   No headaches,  Difficulty swallowing,  Tooth/dental problems, or  Sore throat,                No sneezing, itching, ear ache,  +nasal congestion, post nasal drip,   CV:  No chest pain,  Orthopnea, PND,   anasarca, dizziness, palpitations, syncope.   GI  No heartburn, indigestion, abdominal pain, nausea, vomiting, diarrhea, change in bowel habits, loss of appetite, bloody stools.   Resp:   No coughing up of blood.     No chest wall deformity  Skin: no rash or  lesions.  GU: no dysuria, change in color of urine, no urgency or frequency.  No flank pain, no hematuria   MS:  No joint pain or swelling.  No decreased range of motion.  No back pain.  Psych:  No change in mood or affect. No depression or anxiety.  No memory loss.      SUBJECTIVE:   VITAL SIGNS: Temp:  [97.1 F (36.2 C)] 97.1 F (36.2 C) (02/10 1416) Pulse Rate:  [112] 112 (02/10 1416) BP: (130)/(70) 130/70 mmHg (02/10 1416) SpO2:  [90 %]  90 % (02/10 1416) Weight:  [67.586 kg (149 lb)] 67.586 kg (149 lb) (02/10 1416)              Intake/Output   None     PHYSICAL EXAMINATION: General:  Chronically ill appearing , increased WOB  Neuro:  A/o x 3  HEENT:  Dry mucosa  Cardiovascular:  ST , tr -1+ edema  Lungs:  Coarse junky bilateral rhonchi , min. wheezing Abdomen: soft /NT , BS  Musculoskeletal:  Intact, MAEW x 4  Skin:  No rash   LABS: No results found for this basename: HGB, WBC, PLT, NA, K, CL, CO2, GLUCOSE, BUN, CREATININE, CALCIUM, MG, PHOS, AST, ALT, ALKPHOS, BILITOT, PROT, ALBUMIN, APTT, INR, LATICACIDVEN, TROPONINI, PROCALCITON, PROBNP, O2SATVEN, PHART, PCO2ART, PO2ART,  in the last 168 hours No results found for this basename: GLUCAP,  in the last 168 hours  CXR:   ASSESSMENT / PLAN:  PULMONARY A Acute on Chronic Hypoxic Resp Failure  COPD exacerbation   P:   Admit to ICU SDU at [facility] (pt declined EMS transport )  Begin IV Solumedrol 80 q12  Begin IV levaquin  Sputum cx  O2 for sat >90%  Pt is a DNR    CARDIOVASCULAR A: Sinus Tachycardia suspect due to hypoxia and exacerbation  Hx of CABG in 2009  Hx of SVT   P:  Check BNP , EKG , 1 set of enzymes  Tele/SDU monitoring  Continue on home meds.       GASTROINTESTINAL A:  Hx of GERD and Barretts   P:   PPI      INFECTIOUS A:  AECOPD   P:   Sputum cx  Check xray to r/o PNA    ENDOCRINE A:   P:   Monitor BS on bmet steroids   NEUROLOGIC A:   P:  Monitor     TODAY'S  SUMMARY:  77 yo male with known hx of severe COPD O2 depenedent with acute on chronic hypoxic resp failure complicated by COPD exacerbation  Labs/xray  pending to r/o volume overload as complicating factors.    I have personally obtained a history, examined the patient, evaluated laboratory and imaging results, formulated the assessment and plan and placed orders. CRITICAL CARE: The patient is critically ill with multiple organ systems failure and requires high complexity decision making for assessment and support, frequent evaluation and titration of therapies, application of advanced monitoring technologies and extensive interpretation of multiple databases. Critical Care Time devoted to patient care services described in this note is 45 minutes.    Sandrea Hughs, MD Pulmonary and Critical Care Medicine [facility] Cell [phone] After 5:30 PM or weekends, call [phone]

## 2012-11-28 DIAGNOSIS — J189 Pneumonia, unspecified organism: Secondary | ICD-10-CM | POA: Diagnosis present

## 2012-11-28 DIAGNOSIS — J441 Chronic obstructive pulmonary disease with (acute) exacerbation: Secondary | ICD-10-CM

## 2012-11-28 LAB — INFLUENZA PANEL BY PCR (TYPE A & B): H1N1 flu by pcr: NOT DETECTED

## 2012-11-28 LAB — CBC
Hemoglobin: 14.6 g/dL (ref 13.0–17.0)
MCH: 32.8 pg (ref 26.0–34.0)
MCV: 94.4 fL (ref 78.0–100.0)
RBC: 4.45 MIL/uL (ref 4.22–5.81)

## 2012-11-28 LAB — BASIC METABOLIC PANEL
CO2: 24 mEq/L (ref 19–32)
Calcium: 8.7 mg/dL (ref 8.4–10.5)
Glucose, Bld: 149 mg/dL — ABNORMAL HIGH (ref 70–99)
Sodium: 134 mEq/L — ABNORMAL LOW (ref 135–145)

## 2012-11-28 LAB — LEGIONELLA ANTIGEN, URINE

## 2012-11-28 MED ORDER — SIMVASTATIN 40 MG PO TABS: 40.0000 mg | ORAL_TABLET | Freq: Every day | ORAL | Status: DC

## 2012-11-28 MED ORDER — ASPIRIN 325 MG PO TABS
325.0000 mg | ORAL_TABLET | Freq: Every day | ORAL | Status: DC
  Administered 2012-11-28 – 2012-12-04 (×7): 325 mg via ORAL
  Filled 2012-11-28 (×7): qty 1

## 2012-11-28 MED ORDER — AMLODIPINE BESYLATE 10 MG PO TABS
10.0000 mg | ORAL_TABLET | Freq: Every day | ORAL | Status: DC
  Administered 2012-11-28 – 2012-12-04 (×7): 10 mg via ORAL
  Filled 2012-11-28 (×7): qty 1

## 2012-11-28 MED ORDER — METHYLPREDNISOLONE SODIUM SUCC 40 MG IJ SOLR
40.0000 mg | Freq: Two times a day (BID) | INTRAMUSCULAR | Status: DC
  Administered 2012-11-28 – 2012-11-29 (×3): 40 mg via INTRAVENOUS
  Filled 2012-11-28 (×5): qty 1

## 2012-11-28 MED ORDER — ATORVASTATIN CALCIUM 20 MG PO TABS
20.0000 mg | ORAL_TABLET | Freq: Every day | ORAL | Status: DC
  Administered 2012-11-28 – 2012-12-03 (×6): 20 mg via ORAL
  Filled 2012-11-28 (×7): qty 1

## 2012-11-28 NOTE — Progress Notes (Signed)
[id number]/Rhonda Davis, RN, BSN, CCM:  CHART REVIEWED AND UPDATED.  Next chart review due on [id number]. NO DISCHARGE NEEDS PRESENT AT THIS TIME. CASE MANAGEMENT [phone] 

## 2012-11-28 NOTE — H&P (Signed)
PULMONARY  / CRITICAL CARE MEDICINE  Name: KWANE ROHL MRN: [id number] DOB: October 17, 1936    ADMISSION DATE:  11/27/2012     CHIEF COMPLAINT:  "Cant breath"   BRIEF PATIENT DESCRIPTION: 77 yowm quit smoking 14 y pta with severe COPD-O2 dependent w/ 1 day hx of acute onset increased dyspnea and productive cough w/ purulent sputum. Found to be hypoxic on arrival to office despite 4 l/m of O2.   CULTURES: 2/10  Sputum >> 2/10 Influenza PCR >> 2/10 Pneumococcal Ag >> 2/10 Legionella Ag >>  ANTIBIOTICS: Levaquin 2/10 >>  SUBJECTIVE:  NAD at rest  VITAL SIGNS: Temp:  [97.1 F (36.2 C)-100 F (37.8 C)] 98.3 F (36.8 C) (02/11 0800) Pulse Rate:  [73-112] 91 (02/11 0600) Resp:  [15-29] 19 (02/11 0600) BP: (98-142)/(57-81) 119/57 mmHg (02/11 0600) SpO2:  [79 %-96 %] 96 % (02/11 0600) Weight:  [64.6 kg (142 lb 6.7 oz)-67.586 kg (149 lb)] 64.6 kg (142 lb 6.7 oz) (02/10 1616)  Intake/Output     02/10 0701 - 02/11 0700 02/11 0701 - 02/12 0700   P.O. 600    I.V. (mL/kg) 260 (4)    IV Piggyback 150    Total Intake(mL/kg) 1010 (15.6)    Urine (mL/kg/hr) 650    Total Output 650     Net +360            PHYSICAL EXAMINATION: General:  Chronically ill appearing, speaks in full sentences Neuro:  A/o x 3  HEENT:  Dry mucosa  Cardiovascular:  s1s2 regular Lungs:  B/l wheeze, rales Lt base Abdomen: soft /NT , BS  Musculoskeletal: no edema  Skin:  No rash   LABS:  Recent Labs Lab 11/27/12 1647 11/27/12 1657 11/27/12 1658 11/27/12 1659 11/27/12 2200 11/28/12 0325  HGB  --   --   --  17.0  --  14.6  WBC  --   --   --  22.0*  --  20.5*  PLT  --   --   --  275  --  212  NA  --   --   --  136  --  134*  K  --   --   --  3.7  --  3.7  CL  --   --   --  98  --  99  CO2  --   --   --  24  --  24  GLUCOSE  --   --   --  127*  --  149*  BUN  --   --   --  17  --  15  CREATININE  --   --   --  0.79  --  0.76  CALCIUM  --   --   --  8.9  --  8.7  AST  --   --   --  17  --    --   ALT  --   --   --  11  --   --   ALKPHOS  --   --   --  78  --   --   BILITOT  --   --   --  1.3*  --   --   PROT  --   --   --  7.8  --   --   ALBUMIN  --   --   --  3.9  --   --   LATICACIDVEN  --   --   --  2.3*  --   --   TROPONINI  --   --   --  <0.30 <0.30 <0.30  PROCALCITON  --   --  3.50  --   --   --   PROBNP  --  1234.0*  --   --   --   --   PHART 7.447  --   --   --   --   --   PCO2ART 34.2*  --   --   --   --   --   PO2ART 55.3*  --   --   --   --   --     Imaging: Portable Chest Xray  11/27/2012  *RADIOLOGY REPORT*  Clinical Data: Cough, COPD  PORTABLE CHEST - 1 VIEW  Comparison: CT chest dated 10/06/2012  Findings: Left lower lobe opacity, suspicious for pneumonia.  Additional mild right basilar opacity, possibly atelectasis.  Chronic interstitial markings/emphysematous changes.  The heart is normal in size. Postsurgical changes related to prior CABG.  IMPRESSION: Left lower lobe opacity, suspicious for pneumonia.   Original Report Authenticated By: Charline Bills, M.D.     ASSESSMENT / PLAN:  PULMONARY A: Acute on Chronic Hypoxic Resp Failure  COPD exacerbation  P:   Change solumedrol to 40 mg q12h Scheduled BD's   CARDIOVASCULAR A: Sinus Tachycardia suspect due to hypoxia and exacerbation >> improved 2/11. Hx of CABG in 2009  Hx of SVT  P:  Resume amlodipine, asa Lipitor while in hospital >> pravachol at home Even fluid balance  GASTROINTESTINAL A:  Hx of GERD and Barretts  P:   PPI   INFECTIOUS A:  AECOPD with PNA. P:   Continue levaquin  ENDOCRINE A: No acute issues. P:   Monitor blood sugars on bmet while on steroids   NEUROLOGIC A:  No acute issues. P:  Monitor     TODAY'S SUMMARY:  77 yo male with known hx of severe COPD O2 depenedent with acute on chronic hypoxic resp failure complicated by COPD exacerbation. Reports feeling better.  Brett Canales Minor ACNP [facility] Pager [phone] till 3 pm If no answer page  [phone] 11/28/2012, 9:22 AM   Reviewed above, examined pt and agree with assessment/plan.  He has AECOPD in setting of PNA with hx of GOLD 4 COPD and acute on chronic respiratory failure.  He has some improvement.  Will keep in SDU 2/11 >> if improved then transition to floor 2/12.  DNR status.  Coralyn Helling, MD [facility] Pulmonary/Critical Care 11/28/2012, 9:56 AM Pager:  [phone] After 3pm call: [phone]

## 2012-11-29 ENCOUNTER — Inpatient Hospital Stay (HOSPITAL_COMMUNITY): Payer: Medicare Other

## 2012-11-29 LAB — CBC
MCV: 95.3 fL (ref 78.0–100.0)
Platelets: 220 10*3/uL (ref 150–400)
RBC: 4.5 MIL/uL (ref 4.22–5.81)
WBC: 21.1 10*3/uL — ABNORMAL HIGH (ref 4.0–10.5)

## 2012-11-29 LAB — BASIC METABOLIC PANEL
CO2: 25 mEq/L (ref 19–32)
Chloride: 102 mEq/L (ref 96–112)
Potassium: 4.1 mEq/L (ref 3.5–5.1)
Sodium: 135 mEq/L (ref 135–145)

## 2012-11-29 MED ORDER — SENNOSIDES-DOCUSATE SODIUM 8.6-50 MG PO TABS
1.0000 | ORAL_TABLET | Freq: Two times a day (BID) | ORAL | Status: DC
  Administered 2012-11-29 – 2012-12-04 (×10): 1 via ORAL
  Filled 2012-11-29 (×11): qty 1

## 2012-11-29 MED ORDER — ONDANSETRON HCL 4 MG/2ML IJ SOLN
4.0000 mg | Freq: Four times a day (QID) | INTRAMUSCULAR | Status: DC | PRN
  Administered 2012-11-29: 4 mg via INTRAVENOUS
  Filled 2012-11-29: qty 2

## 2012-11-29 MED ORDER — POLYETHYLENE GLYCOL 3350 17 G PO PACK
17.0000 g | PACK | Freq: Every day | ORAL | Status: DC | PRN
  Administered 2012-11-29 – 2012-12-01 (×2): 17 g via ORAL
  Filled 2012-11-29 (×2): qty 1

## 2012-11-29 MED ORDER — FUROSEMIDE 10 MG/ML IJ SOLN
40.0000 mg | Freq: Once | INTRAMUSCULAR | Status: AC
  Administered 2012-11-29: 40 mg via INTRAVENOUS
  Filled 2012-11-29: qty 4

## 2012-11-29 NOTE — Progress Notes (Signed)
PULMONARY  / CRITICAL CARE MEDICINE  Name: [name] MRN: [id number] DOB: 1936/05/31    ADMISSION DATE:  11/27/2012     CHIEF COMPLAINT:  "Cant breath"   BRIEF PATIENT DESCRIPTION: 76 yowm quit smoking 14 y pta with severe COPD-O2 dependent w/ 1 day hx of acute onset increased dyspnea and productive cough w/ purulent sputum. Found to be hypoxic on arrival to office despite 4 l/m of O2.   CULTURES: 2/10  Sputum >> 2/10 Influenza PCR >> 2/10 Pneumococcal Ag >> 2/10 Legionella Ag >>  ANTIBIOTICS: Levaquin 2/10 >>  SUBJECTIVE:  NAD at rest  VITAL SIGNS: Temp:  [97.3 F (36.3 C)-99.2 F (37.3 C)] 99.2 F (37.3 C) (02/12 0800) Pulse Rate:  [94-109] 95 (02/12 0800) Resp:  [17-25] 20 (02/12 0800) BP: (108-135)/(62-93) 135/68 mmHg (02/12 0800) SpO2:  [91 %-96 %] 94 % (02/12 0915)  Intake/Output     02/11 0701 - 02/12 0700 02/12 0701 - 02/13 0700   P.O. 560    I.V. (mL/kg) 460 (7.1) 20 (0.3)   IV Piggyback 150    Total Intake(mL/kg) 1170 (18.1) 20 (0.3)   Urine (mL/kg/hr) 1625 (1)    Total Output 1625     Net -455 +20          PHYSICAL EXAMINATION: General:  Chronically ill appearing, speaks in full sentences Neuro:  A/o x 3  HEENT:  Dry mucosa  Cardiovascular:  s1s2 regular Lungs:  Decreased wheeze Abdomen: soft /NT , BS  Musculoskeletal: no edema  Skin:  No rash   LABS:  Recent Labs Lab 11/27/12 1647 11/27/12 1657 11/27/12 1658  11/27/12 1659 11/27/12 2200 11/28/12 0325 11/29/12 0335  HGB  --   --   --   --  17.0  --  14.6 14.5  WBC  --   --   --   --  22.0*  --  20.5* 21.1*  PLT  --   --   --   --  275  --  212 220  NA  --   --   --   --  136  --  134* 135  K  --   --   --   < > 3.7  --  3.7 4.1  CL  --   --   --   --  98  --  99 102  CO2  --   --   --   --  24  --  24 25  GLUCOSE  --   --   --   --  127*  --  149* 154*  BUN  --   --   --   --  17  --  15 16  CREATININE  --   --   --   --  0.79  --  0.76 0.70  CALCIUM  --   --   --   --   8.9  --  8.7 8.9  AST  --   --   --   --  17  --   --   --   ALT  --   --   --   --  11  --   --   --   ALKPHOS  --   --   --   --  78  --   --   --   BILITOT  --   --   --   --  1.3*  --   --   --  PROT  --   --   --   --  7.8  --   --   --   ALBUMIN  --   --   --   --  3.9  --   --   --   LATICACIDVEN  --   --   --   --  2.3*  --   --   --   TROPONINI  --   --   --   --  <0.30 <0.30 <0.30  --   PROCALCITON  --   --  3.50  --   --   --   --   --   PROBNP  --  1234.0*  --   --   --   --   --   --   PHART 7.447  --   --   --   --   --   --   --   PCO2ART 34.2*  --   --   --   --   --   --   --   PO2ART 55.3*  --   --   --   --   --   --   --   < > = values in this interval not displayed.  Imaging: Dg Chest Port 1 View  11/29/2012  *RADIOLOGY REPORT*  Clinical Data: Pneumonia  PORTABLE CHEST - 1 VIEW  Comparison: Chest radiograph 11/27/2012  Findings: Sternotomy wires overlie normal cardiac silhouette. There is fibrotic change at the lung bases and bullous change in the apices.  No focal consolidation.  No pneumothorax.  IMPRESSION: Stable emphysematous change.   Original Report Authenticated By: Genevive Bi, M.D.    Portable Chest Xray  11/27/2012  *RADIOLOGY REPORT*  Clinical Data: Cough, COPD  PORTABLE CHEST - 1 VIEW  Comparison: CT chest dated 10/06/2012  Findings: Left lower lobe opacity, suspicious for pneumonia.  Additional mild right basilar opacity, possibly atelectasis.  Chronic interstitial markings/emphysematous changes.  The heart is normal in size. Postsurgical changes related to prior CABG.  IMPRESSION: Left lower lobe opacity, suspicious for pneumonia.   Original Report Authenticated By: Charline Bills, M.D.     ASSESSMENT / PLAN:  PULMONARY A: Acute on Chronic Hypoxic Resp Failure  COPD exacerbation  P:   Change solumedrol to 40 mg q12h 2-11, no taper 2-12 Scheduled BD's F/u CXR intermittently   CARDIOVASCULAR A: Sinus Tachycardia suspect due to hypoxia and  exacerbation >> improved 2/12. Hx of CABG in 2009  Hx of SVT  P:  Resumed amlodipine, asa Lipitor while in hospital >> pravachol at home 2-12 gentle diuresis  GASTROINTESTINAL A:  Hx of GERD and Barretts  P:   PPI   INFECTIOUS A:  AECOPD with PNA. P:   Continue levaquin  ENDOCRINE A: No acute issues. P:   Monitor blood sugars on bmet while on steroids   NEUROLOGIC A:  No acute issues. P:  Monitor     TODAY'S SUMMARY:  77 yo male with known hx of severe COPD O2 depenedent with acute on chronic hypoxic resp failure complicated by COPD exacerbation. Reports feeling better but slow progress. Keep in sdu one more day. Gentle diuresis x 1.   Brett Canales Minor ACNP [facility] Pager [phone] till 3 pm If no answer page [phone] 11/29/2012, 11:24 AM   Reviewed above, examined pt and agree with assessment/plan.  He has AECOPD in setting of PNA with hx of GOLD 4 COPD and acute on  chronic respiratory failure.  He has some improvement.  Will keep in SDU 2/12 >> if improved then transition to floor 2/13.  DNR status.  Coralyn Helling, MD [facility] Pulmonary/Critical Care 11/29/2012, 11:46 AM Pager:  [phone] After 3pm call: [phone]

## 2012-11-30 LAB — BASIC METABOLIC PANEL
CO2: 23 mEq/L (ref 19–32)
Chloride: 100 mEq/L (ref 96–112)
Glucose, Bld: 168 mg/dL — ABNORMAL HIGH (ref 70–99)
Potassium: 3.7 mEq/L (ref 3.5–5.1)
Sodium: 135 mEq/L (ref 135–145)

## 2012-11-30 LAB — MAGNESIUM: Magnesium: 2.2 mg/dL (ref 1.5–2.5)

## 2012-11-30 LAB — PHOSPHORUS: Phosphorus: 3.6 mg/dL (ref 2.3–4.6)

## 2012-11-30 LAB — PRO B NATRIURETIC PEPTIDE: Pro B Natriuretic peptide (BNP): 1516 pg/mL — ABNORMAL HIGH (ref 0–125)

## 2012-11-30 MED ORDER — BISACODYL 10 MG RE SUPP
10.0000 mg | Freq: Every day | RECTAL | Status: DC | PRN
  Administered 2012-11-30: 10 mg via RECTAL
  Filled 2012-11-30: qty 1

## 2012-11-30 MED ORDER — GUAIFENESIN ER 600 MG PO TB12
600.0000 mg | ORAL_TABLET | Freq: Two times a day (BID) | ORAL | Status: DC
  Administered 2012-11-30 – 2012-12-04 (×9): 600 mg via ORAL
  Filled 2012-11-30 (×11): qty 1

## 2012-11-30 MED ORDER — METHYLPREDNISOLONE SODIUM SUCC 40 MG IJ SOLR
40.0000 mg | Freq: Every day | INTRAMUSCULAR | Status: DC
  Administered 2012-11-30 – 2012-12-02 (×3): 40 mg via INTRAVENOUS
  Filled 2012-11-30 (×2): qty 1

## 2012-11-30 MED ORDER — FLEET ENEMA 7-19 GM/118ML RE ENEM: 1.0000 | ENEMA | Freq: Every day | RECTAL | Status: DC | PRN

## 2012-11-30 NOTE — Progress Notes (Signed)
PULMONARY  / CRITICAL CARE MEDICINE  Name: [name] MRN: [id number] DOB: 04/20/1936    ADMISSION DATE:  11/27/2012     CHIEF COMPLAINT:  "Cant breath"   BRIEF PATIENT DESCRIPTION: 76 yowm quit smoking 14 y pta with severe COPD-O2 dependent w/ 1 day hx of acute onset increased dyspnea and productive cough w/ purulent sputum. Found to be hypoxic on arrival to office despite 4 l/m of O2.   CULTURES: 2/10  Sputum >> 2/10 Influenza PCR >> negative 2/10 Pneumococcal Ag >> negative 2/10 Legionella Ag >> negative  ANTIBIOTICS: Levaquin 2/10 >>  SUBJECTIVE:  NAD at rest.  Still has abd bloating, but better and passing gas.  VITAL SIGNS: Temp:  [97.1 F (36.2 C)-99.1 F (37.3 C)] 99.1 F (37.3 C) (02/13 0400) Pulse Rate:  [83-110] 83 (02/13 0526) Resp:  [17-23] 17 (02/13 0526) BP: (109-141)/(69-89) 109/78 mmHg (02/13 0526) SpO2:  [91 %-98 %] 96 % (02/13 0802) FiO2 (%):  [93 %] 93 % (02/12 1953)  Intake/Output     02/12 0701 - 02/13 0700 02/13 0701 - 02/14 0700   P.O. 600    I.V. (mL/kg) 363 (5.6)    IV Piggyback 150    Total Intake(mL/kg) 1113 (17.2)    Urine (mL/kg/hr) 1655 (1.1) 200 (1.6)   Total Output 1655 200   Net -542 -200          PHYSICAL EXAMINATION: General:  Chronically ill appearing, speaks in full sentences Neuro:  A/o x 3  HEENT:  Dry mucosa  Cardiovascular:  s1s2 regular Lungs:  Decreased breath sounds, no wheeze Abdomen: soft /NT , BS  Musculoskeletal: no edema  Skin:  No rash   LABS:  Recent Labs Lab 11/27/12 1647 11/27/12 1657 11/27/12 1658  11/27/12 1659 11/27/12 2200 11/28/12 0325 11/29/12 0335 11/30/12 0305 11/30/12 0330  HGB  --   --   --   --  17.0  --  14.6 14.5  --   --   WBC  --   --   --   --  22.0*  --  20.5* 21.1*  --   --   PLT  --   --   --   --  275  --  212 220  --   --   NA  --   --   --   < > 136  --  134* 135 135  --   K  --   --   --   < > 3.7  --  3.7 4.1 3.7  --   CL  --   --   --   < > 98  --  99 102 100   --   CO2  --   --   --   < > 24  --  24 25 23   --   GLUCOSE  --   --   --   < > 127*  --  149* 154* 168*  --   BUN  --   --   --   < > 17  --  15 16 21   --   CREATININE  --   --   --   < > 0.79  --  0.76 0.70 0.67  --   CALCIUM  --   --   --   < > 8.9  --  8.7 8.9 8.5  --   MG  --   --   --   --   --   --   --   --  2.2  --   PHOS  --   --   --   --   --   --   --   --  3.6  --   AST  --   --   --   --  17  --   --   --   --   --   ALT  --   --   --   --  11  --   --   --   --   --   ALKPHOS  --   --   --   --  78  --   --   --   --   --   BILITOT  --   --   --   --  1.3*  --   --   --   --   --   PROT  --   --   --   --  7.8  --   --   --   --   --   ALBUMIN  --   --   --   --  3.9  --   --   --   --   --   LATICACIDVEN  --   --   --   --  2.3*  --   --   --   --   --   TROPONINI  --   --   --   --  <0.30 <0.30 <0.30  --   --   --   PROCALCITON  --   --  3.50  --   --   --   --   --   --   --   PROBNP  --  1234.0*  --   --   --   --   --   --   --  1516.0*  PHART 7.447  --   --   --   --   --   --   --   --   --   PCO2ART 34.2*  --   --   --   --   --   --   --   --   --   PO2ART 55.3*  --   --   --   --   --   --   --   --   --   < > = values in this interval not displayed.  Imaging: Dg Chest Port 1 View  11/29/2012  *RADIOLOGY REPORT*  Clinical Data: Pneumonia  PORTABLE CHEST - 1 VIEW  Comparison: Chest radiograph 11/27/2012  Findings: Sternotomy wires overlie normal cardiac silhouette. There is fibrotic change at the lung bases and bullous change in the apices.  No focal consolidation.  No pneumothorax.  IMPRESSION: Stable emphysematous change.   Original Report Authenticated By: Genevive Bi, M.D.     ASSESSMENT / PLAN:  PULMONARY A: Acute on Chronic Hypoxic Resp Failure 2nd to PNA and COPD exacerbation  P:   Change solumedrol to 40 mg QD 2-13 >> likely transition to prednisone soon Scheduled BD's F/u CXR intermittently Add flutter valve, mucinex  2/13  CARDIOVASCULAR A: Sinus Tachycardia suspect due to hypoxia and exacerbation >> improved 2/12. Hx of CABG in 2009  Hx of SVT  P:  Resumed amlodipine, asa Lipitor while in hospital >> pravachol at home Even fluid balance  GASTROINTESTINAL A:  Hx of GERD and Barretts  Constipation. P:  PPI  Dulcolax, Miralax prn Mobilize Limit narcotics senokot  INFECTIOUS A:  AECOPD with PNA. P:   Continue levaquin D4/7-10  ENDOCRINE A: No acute issues. P:   Monitor blood sugars on bmet while on steroids   NEUROLOGIC A:  No acute issues. P:  Monitor     TODAY'S SUMMARY:  77 yo male with known hx of severe COPD O2 depenedent with acute on chronic hypoxic resp failure complicated by COPD exacerbation. Reports feeling better but slow progress. Tx to telemetry [facility] Minor ACNP [facility] [name] PCCM Pager [phone] till 3 pm If no answer page [phone] 11/30/2012, 9:00 AM   He has AECOPD in setting of PNA with hx of GOLD 4 COPD and acute on chronic respiratory failure.  He has some improvement.  Transition to floor 2/13.  DNR status.   Updated wife.  Coralyn Helling, MD [facility] Pulmonary/Critical Care 11/30/2012, 10:28 AM Pager:  [phone] After 3pm call: [phone]

## 2012-12-01 LAB — BASIC METABOLIC PANEL
Calcium: 8.5 mg/dL (ref 8.4–10.5)
GFR calc Af Amer: >90 mL/min (ref >90)
GFR calc non Af Amer: >90 mL/min (ref >90)
Potassium: 4 mEq/L (ref 3.5–5.1)
Sodium: 135 mEq/L (ref 135–145)

## 2012-12-01 LAB — CULTURE, RESPIRATORY W GRAM STAIN

## 2012-12-01 MED ORDER — ZOLPIDEM TARTRATE 5 MG PO TABS
5.0000 mg | ORAL_TABLET | Freq: Every evening | ORAL | Status: DC | PRN
  Administered 2012-12-01 – 2012-12-02 (×2): 5 mg via ORAL
  Filled 2012-12-01 (×2): qty 1

## 2012-12-01 NOTE — Evaluation (Signed)
Physical Therapy Evaluation Patient Details Name: [name] MRN: [id number] DOB: 20-Aug-1936 Today's Date: 12/01/2012 Time: 4098-1191 PT Time Calculation (min): 18 min  PT Assessment / Plan / Recommendation Clinical Impression  77 y.o. male with h/o O2 dependent COPD admitted wtih PNA. Pt ambulated 140' with 4L O2, SaO2 88-94%. Pt would benefit from outpt pulmonary rehab. Acute PT will follow to maximize safety and independence with mobility.      PT Assessment  Patient needs continued PT services    Follow Up Recommendations  Other (comment) (pulmonary rehab)    Does the patient have the potential to tolerate intense rehabilitation      Barriers to Discharge None      Equipment Recommendations  None recommended by PT    Recommendations for Other Services OT consult   Frequency Min 3X/week    Precautions / Restrictions Precautions Precaution Comments: monitor O2 Sats Restrictions Weight Bearing Restrictions: No   Pertinent Vitals/Pain *SaO2 88-94% on 4L O2 [place] with walking; HR 95-105 0/10 pain**      Mobility  Bed Mobility Bed Mobility: Not assessed Transfers Transfers: Sit to Stand;Stand to Sit Sit to Stand: 6: Modified independent (Device/Increase time);From chair/3-in-1;With armrests Stand to Sit: 6: Modified independent (Device/Increase time);To chair/3-in-1;With armrests Ambulation/Gait Ambulation/Gait Assistance: 5: Supervision Ambulation Distance (Feet): 140 Feet Assistive device: Other (Comment) (pushing O2 tank in RUE) Gait Pattern: Within Functional Limits General Gait Details: SaO2 88-94% with walking with 4L O2 [place], HR 95-105    Exercises     PT Diagnosis: Generalized weakness  PT Problem List: Decreased activity tolerance;Cardiopulmonary status limiting activity PT Treatment Interventions: Gait training;Functional mobility training;Therapeutic exercise;Patient/family education   PT Goals Acute Rehab PT Goals PT Goal Formulation: With  patient/family Time For Goal Achievement: 12/15/12 Potential to Achieve Goals: Good Pt will go Supine/Side to Sit: Independently PT Goal: Supine/Side to Sit - Progress: Goal set today Pt will Ambulate: >150 feet;with modified independence (with SaO2 greater than 90%) PT Goal: Ambulate - Progress: Goal set today Pt will Go Up / Down Stairs: 3-5 stairs;with supervision PT Goal: Up/Down Stairs - Progress: Goal set today Pt will Perform Home Exercise Program: Independently PT Goal: Perform Home Exercise Program - Progress: Goal set today  Visit Information  Last PT Received On: 12/01/12 Assistance Needed: +1    Subjective Data  Subjective: I was going to start pulmonary rehab today at [facility].  Patient Stated Goal: walk to mailbox   Prior Functioning  Home Living Lives With: Spouse Available Help at Discharge: Family Type of Home: House Home Access: Stairs to enter Secretary/administrator of Steps: 9 Home Layout: Two level Home Adaptive Equipment: Straight cane;Other (comment) (oxygen -was on 3L at home) Prior Function Level of Independence: Independent with assistive device(s) Able to Take Stairs?: Yes Driving: Yes Communication Communication: No difficulties    Cognition  Cognition Overall Cognitive Status: Appears within functional limits for tasks assessed/performed Arousal/Alertness: Awake/alert Orientation Level: Appears intact for tasks assessed Behavior During Session: [facility] for tasks performed    Extremity/Trunk Assessment Right Upper Extremity Assessment RUE ROM/Strength/Tone: [facility] for tasks assessed Left Upper Extremity Assessment LUE ROM/Strength/Tone: WFL for tasks assessed Right Lower Extremity Assessment RLE ROM/Strength/Tone: Within functional levels RLE Sensation: WFL - Light Touch RLE Coordination: WFL - gross/fine motor Left Lower Extremity Assessment LLE ROM/Strength/Tone: Within functional levels LLE Sensation: WFL - Light Touch LLE Coordination:  WFL - gross/fine motor Trunk Assessment Trunk Assessment: Kyphotic   Balance    End of Session PT -  End of Session Equipment Utilized During Treatment: Oxygen Activity Tolerance: Patient limited by fatigue Patient left: in chair;with call bell/phone within reach;with family/visitor present Nurse Communication: Mobility status  GP     Ralene Bathe Kistler 12/01/2012, 2:00 PM [phone]

## 2012-12-01 NOTE — Progress Notes (Signed)
PULMONARY  / CRITICAL CARE MEDICINE  Name: [name] MRN: [id number] DOB: 18-Feb-1936    ADMISSION DATE:  11/27/2012     CHIEF COMPLAINT:  "Cant breath"   BRIEF PATIENT DESCRIPTION: 77 yowm quit smoking 14 y pta with severe COPD-O2 dependent w/ 1 day hx of acute onset increased dyspnea and productive cough w/ purulent sputum. Found to be hypoxic on arrival to office despite 4 l/m of O2 > admitted to PCCM svc  CULTURES: 2/12  Sputum >> abundant candida on abx c/w colonization of orophyx 2/10 Influenza PCR >> negative 2/10 Pneumococcal Ag >> negative 2/10 Legionella Ag >> negative  ANTIBIOTICS: Levaquin 2/10 >>  SUBJECTIVE:  NAD at rest.     VITAL SIGNS: Temp:  [97 F (36.1 C)-98.3 F (36.8 C)] 98.3 F (36.8 C) (02/14 0555) Pulse Rate:  [85-94] 85 (02/14 0555) Resp:  [16-18] 16 (02/14 0555) BP: (119-133)/(66-72) 133/72 mmHg (02/14 0555) SpO2:  [93 %-96 %] 93 % (02/14 0834) Weight:  [141 lb 9.6 oz (64.229 kg)] 141 lb 9.6 oz (64.229 kg) (02/14 0555) 02 Rx =  4lpm ( vs baseline 3 @home )  Intake/Output     02/13 0701 - 02/14 0700 02/14 0701 - 02/15 0700   P.O. 480 240   I.V. (mL/kg) 20 (0.3)    IV Piggyback 150    Total Intake(mL/kg) 650 (10.1) 240 (3.7)   Urine (mL/kg/hr) 825 (0.5) 400 (1)   Total Output 825 400   Net -175 -160        Urine Occurrence 1 x      PHYSICAL EXAMINATION: General:  Chronically ill appearing, speaks in full sentences Neuro:  A/o x 3  HEENT:  Dry mucosa  Cardiovascular:  s1s2 regular Lungs:  Decreased breath sounds, no wheeze Abdomen: soft /NT , BS  Musculoskeletal: no edema  Skin:  No rash   LABS:  Recent Labs Lab 11/27/12 1647 11/27/12 1657 11/27/12 1658  11/27/12 1659 11/27/12 2200 11/28/12 0325 11/29/12 0335 11/30/12 0305 11/30/12 0330 12/01/12 0451  HGB  --   --   --   --  17.0  --  14.6 14.5  --   --   --   WBC  --   --   --   --  22.0*  --  20.5* 21.1*  --   --   --   PLT  --   --   --   --  275  --  212 220   --   --   --   NA  --   --   --   < > 136  --  134* 135 135  --  135  K  --   --   --   < > 3.7  --  3.7 4.1 3.7  --  4.0  CL  --   --   --   < > 98  --  99 102 100  --  100  CO2  --   --   --   < > 24  --  24 25 23   --  26  GLUCOSE  --   --   --   < > 127*  --  149* 154* 168*  --  98  BUN  --   --   --   < > 17  --  15 16 21   --  16  CREATININE  --   --   --   < > 0.79  --  0.76 0.70 0.67  --  0.63  CALCIUM  --   --   --   < > 8.9  --  8.7 8.9 8.5  --  8.5  MG  --   --   --   --   --   --   --   --  2.2  --   --   PHOS  --   --   --   --   --   --   --   --  3.6  --   --   AST  --   --   --   --  17  --   --   --   --   --   --   ALT  --   --   --   --  11  --   --   --   --   --   --   ALKPHOS  --   --   --   --  78  --   --   --   --   --   --   BILITOT  --   --   --   --  1.3*  --   --   --   --   --   --   PROT  --   --   --   --  7.8  --   --   --   --   --   --   ALBUMIN  --   --   --   --  3.9  --   --   --   --   --   --   LATICACIDVEN  --   --   --   --  2.3*  --   --   --   --   --   --   TROPONINI  --   --   --   --  <0.30 <0.30 <0.30  --   --   --   --   PROCALCITON  --   --  3.50  --   --   --   --   --   --   --   --   PROBNP  --  1234.0*  --   --   --   --   --   --   --  1516.0*  --   PHART 7.447  --   --   --   --   --   --   --   --   --   --   PCO2ART 34.2*  --   --   --   --   --   --   --   --   --   --   PO2ART 55.3*  --   --   --   --   --   --   --   --   --   --   < > = values in this interval not displayed.  pcxr 11/28/12  Copd only   ASSESSMENT / PLAN:  PULMONARY A: Acute on Chronic Hypoxic Resp Failure 2nd to PNA and COPD exacerbation  P:   Changed solumedrol to 40 mg QD 2-13 > change to pred 2/14 Scheduled BD's  Added flutter valve, mucinex 2/13  CARDIOVASCULAR A: Sinus Tachycardia ? due to hypoxia and exacerbation >> improved 2/12. Hx of CABG in 2009  Hx of SVT  P:  Resumed amlodipine, asa Lipitor while in hospital >> pravachol at home Even  fluid balance  GASTROINTESTINAL A:  Hx of GERD and Barretts  Constipation. P:   PPI  Dulcolax, Miralax prn Mobilize Limit narcotics senokot  INFECTIOUS A:  AECOPD with PNA. P:   Continue levaquin x 7 days total course>  complete 2/17  ENDOCRINE A: No acute issues. P:   Monitor blood sugars on bmet while on steroids   NEUROLOGIC A:  No acute issues. P:  Monitor     TODAY'S SUMMARY:  77 yo male with known hx of severe COPD O2 depenedent with acute on chronic hypoxic resp failure complicated by COPD exacerbation. Reports feeling better but slow progress and very debilitated ? Home 2/15   Sandrea Hughs, MD Pulmonary and Critical Care Medicine [facility] Cell [phone] After 5:30 PM or weekends, call [phone]

## 2012-12-01 NOTE — Progress Notes (Signed)
Insomnia   Ambien ordered for prn use
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
## 2012-12-02 MED ORDER — PREDNISONE 20 MG PO TABS
40.0000 mg | ORAL_TABLET | Freq: Every day | ORAL | Status: DC
  Administered 2012-12-03 – 2012-12-04 (×2): 40 mg via ORAL
  Filled 2012-12-02 (×3): qty 2

## 2012-12-02 NOTE — Progress Notes (Signed)
Paged on call doctor (from Dr. Reginia Naas office) to check  if pt is being discharged today. No response. Continuing to monitor pt.

## 2012-12-02 NOTE — Progress Notes (Signed)
PULMONARY  / CRITICAL CARE MEDICINE  Name: [name] MRN: [id number] DOB: [birth year]    ADMISSION DATE:  11/27/2012     CHIEF COMPLAINT:  "Cant breath"   BRIEF PATIENT DESCRIPTION: 76 yowm quit smoking 14 y pta with severe COPD-O2 dependent w/ 1 day hx of acute onset increased dyspnea and productive cough w/ purulent sputum. Found to be hypoxic on arrival to office despite 4 l/m of O2 > admitted to PCCM svc  CULTURES: 2/12  Sputum >> abundant candida on abx c/w colonization of orophyx 2/10 Influenza PCR >> negative 2/10 Pneumococcal Ag >> negative 2/10 Legionella Ag >> negative  ANTIBIOTICS: Levaquin 2/10 >>  SUBJECTIVE:  Pt comfortable with no increased wob.  Wants to go home.  Walked in halls yest.    VITAL SIGNS: Temp:  [97.2 F (36.2 C)-98.2 F (36.8 C)] 97.8 F (36.6 C) (02/15 1300) Pulse Rate:  [82-92] 91 (02/15 1300) Resp:  [18-20] 20 (02/15 1300) BP: (118-123)/(47-71) 123/47 mmHg (02/15 0559) SpO2:  [95 %-99 %] 99 % (02/15 1300) Weight:  [64.683 kg (142 lb 9.6 oz)] 64.683 kg (142 lb 9.6 oz) (02/15 0559) 02 Rx =  4lpm ( vs baseline 3 @home )  Intake/Output     02/14 0701 - 02/15 0700 02/15 0701 - 02/16 0700   P.O. 960 720   I.V. (mL/kg)     IV Piggyback 150    Total Intake(mL/kg) 1110 (17.2) 720 (11.1)   Urine (mL/kg/hr) 700 (0.5)    Total Output 700     Net +410 +720        Urine Occurrence 1 x 1 x   Stool Occurrence  1 x     PHYSICAL EXAMINATION: General:  Wd male in nad Neuro:  A/o x 3 , moves all 4.  HEENT: nares without d/c or purulence.  No LN or TMG Cardiovascular: rrr Lungs:  Decreased breath sounds, no wheeze Abdomen: soft /NT , BS  Musculoskeletal: no edema  No cyanosis or calf tenderness.   LABS:  Recent Labs Lab 11/27/12 1647 11/27/12 1657 11/27/12 1658  11/27/12 1659 11/27/12 2200 11/28/12 0325 11/29/12 0335 11/30/12 0305 11/30/12 0330 12/01/12 0451  HGB  --   --   --   --  17.0  --  14.6 14.5  --   --   --   WBC  --    --   --   --  22.0*  --  20.5* 21.1*  --   --   --   PLT  --   --   --   --  275  --  212 220  --   --   --   NA  --   --   --   < > 136  --  134* 135 135  --  135  K  --   --   --   < > 3.7  --  3.7 4.1 3.7  --  4.0  CL  --   --   --   < > 98  --  99 102 100  --  100  CO2  --   --   --   < > 24  --  24 25 23   --  26  GLUCOSE  --   --   --   < > 127*  --  149* 154* 168*  --  98  BUN  --   --   --   < > 17  --  15 16 21   --  16  CREATININE  --   --   --   < > 0.79  --  0.76 0.70 0.67  --  0.63  CALCIUM  --   --   --   < > 8.9  --  8.7 8.9 8.5  --  8.5  MG  --   --   --   --   --   --   --   --  2.2  --   --   PHOS  --   --   --   --   --   --   --   --  3.6  --   --   AST  --   --   --   --  17  --   --   --   --   --   --   ALT  --   --   --   --  11  --   --   --   --   --   --   ALKPHOS  --   --   --   --  78  --   --   --   --   --   --   BILITOT  --   --   --   --  1.3*  --   --   --   --   --   --   PROT  --   --   --   --  7.8  --   --   --   --   --   --   ALBUMIN  --   --   --   --  3.9  --   --   --   --   --   --   LATICACIDVEN  --   --   --   --  2.3*  --   --   --   --   --   --   TROPONINI  --   --   --   --  <0.30 <0.30 <0.30  --   --   --   --   PROCALCITON  --   --  3.50  --   --   --   --   --   --   --   --   PROBNP  --  1234.0*  --   --   --   --   --   --   --  1516.0*  --   PHART 7.447  --   --   --   --   --   --   --   --   --   --   PCO2ART 34.2*  --   --   --   --   --   --   --   --   --   --   PO2ART 55.3*  --   --   --   --   --   --   --   --   --   --   < > = values in this interval not displayed.  pcxr 11/28/12  Copd only   ASSESSMENT / PLAN:  PULMONARY A: Acute on Chronic Hypoxic Resp Failure 2nd to PNA and COPD exacerbation  P:   Scheduled BD's  Added flutter valve, mucinex 2/13 Change to po prednisone today  CARDIOVASCULAR A: Sinus Tachycardia ? due to hypoxia and exacerbation >> improved 2/12. Hx of CABG in 2009  Hx of SVT  P:  Resumed  amlodipine, asa Lipitor while in hospital >> pravachol at home Even fluid balance  GASTROINTESTINAL A:  Hx of GERD and Barretts  Constipation. P:   PPI  Dulcolax, Miralax prn Mobilize Limit narcotics senokot  INFECTIOUS A:  AECOPD with PNA. P:   Continue levaquin x 7 days total course>  complete 2/17  ENDOCRINE A: No acute issues. P:   Monitor blood sugars on bmet while on steroids

## 2012-12-03 NOTE — Progress Notes (Signed)
PULMONARY  / CRITICAL CARE MEDICINE  Name: [name] MRN: [id number] DOB: 12-02-1935    ADMISSION DATE:  11/27/2012     CHIEF COMPLAINT:  "Cant breath"   BRIEF PATIENT DESCRIPTION: 76 yowm quit smoking 14 y pta with severe COPD-O2 dependent w/ 1 day hx of acute onset increased dyspnea and productive cough w/ purulent sputum. Found to be hypoxic on arrival to office despite 4 l/m of O2 > admitted to PCCM svc  CULTURES: 2/12  Sputum >> abundant candida on abx c/w colonization of orophyx 2/10 Influenza PCR >> negative 2/10 Pneumococcal Ag >> negative 2/10 Legionella Ag >> negative  ANTIBIOTICS: Levaquin 2/10 >>  SUBJECTIVE:  Pt comfortable with no increased wob.  Walked in halls yest.    VITAL SIGNS: Temp:  [97.9 F (36.6 C)-98.2 F (36.8 C)] 97.9 F (36.6 C) (02/16 0519) Pulse Rate:  [86-93] 86 (02/16 0519) Resp:  [20] 20 (02/16 0519) BP: (129-135)/(66-75) 135/75 mmHg (02/16 0519) SpO2:  [95 %-98 %] 95 % (02/16 0519) Weight:  [63.413 kg (139 lb 12.8 oz)] 63.413 kg (139 lb 12.8 oz) (02/16 0519) 02 Rx =  4lpm ( vs baseline 3 @home )  Intake/Output     02/15 0701 - 02/16 0700 02/16 0701 - 02/17 0700   P.O. 720    IV Piggyback     Total Intake(mL/kg) 720 (11.4)    Urine (mL/kg/hr) 800 (0.5)    Total Output 800     Net -80          Urine Occurrence 3 x    Stool Occurrence 1 x      PHYSICAL EXAMINATION: General:  Wd male in nad Neuro:  A/o x 3 , moves all 4.  HEENT: nares without d/c or purulence.  No LN or TMG Cardiovascular: rrr Lungs:  Decreased breath sounds, no wheeze Abdomen: soft /NT , BS  Musculoskeletal: no edema  No cyanosis or calf tenderness.   LABS:  Recent Labs Lab 11/27/12 1647 11/27/12 1657 11/27/12 1658  11/27/12 1659 11/27/12 2200 11/28/12 0325 11/29/12 0335 11/30/12 0305 11/30/12 0330 12/01/12 0451  HGB  --   --   --   --  17.0  --  14.6 14.5  --   --   --   WBC  --   --   --   --  22.0*  --  20.5* 21.1*  --   --   --   PLT   --   --   --   --  275  --  212 220  --   --   --   NA  --   --   --   < > 136  --  134* 135 135  --  135  K  --   --   --   < > 3.7  --  3.7 4.1 3.7  --  4.0  CL  --   --   --   < > 98  --  99 102 100  --  100  CO2  --   --   --   < > 24  --  24 25 23   --  26  GLUCOSE  --   --   --   < > 127*  --  149* 154* 168*  --  98  BUN  --   --   --   < > 17  --  15 16 21   --  16  CREATININE  --   --   --   < >  0.79  --  0.76 0.70 0.67  --  0.63  CALCIUM  --   --   --   < > 8.9  --  8.7 8.9 8.5  --  8.5  MG  --   --   --   --   --   --   --   --  2.2  --   --   PHOS  --   --   --   --   --   --   --   --  3.6  --   --   AST  --   --   --   --  17  --   --   --   --   --   --   ALT  --   --   --   --  11  --   --   --   --   --   --   ALKPHOS  --   --   --   --  78  --   --   --   --   --   --   BILITOT  --   --   --   --  1.3*  --   --   --   --   --   --   PROT  --   --   --   --  7.8  --   --   --   --   --   --   ALBUMIN  --   --   --   --  3.9  --   --   --   --   --   --   LATICACIDVEN  --   --   --   --  2.3*  --   --   --   --   --   --   TROPONINI  --   --   --   --  <0.30 <0.30 <0.30  --   --   --   --   PROCALCITON  --   --  3.50  --   --   --   --   --   --   --   --   PROBNP  --  1234.0*  --   --   --   --   --   --   --  1516.0*  --   PHART 7.447  --   --   --   --   --   --   --   --   --   --   PCO2ART 34.2*  --   --   --   --   --   --   --   --   --   --   PO2ART 55.3*  --   --   --   --   --   --   --   --   --   --   < > = values in this interval not displayed.  pcxr 11/28/12  Copd only   ASSESSMENT / PLAN:  PULMONARY A: Acute on Chronic Hypoxic Resp Failure 2nd to PNA and COPD exacerbation  P:   Scheduled BD's  Added flutter valve, mucinex  Taper po prednisone  CARDIOVASCULAR A: Sinus Tachycardia ? due to hypoxia and exacerbation >> improved 2/12. Hx of CABG in 2009  Hx of SVT  P:  Resumed amlodipine, asa Lipitor while in hospital >> pravachol at home Even fluid  balance  GASTROINTESTINAL A:  Hx of GERD and Barretts  Constipation. P:   PPI  Dulcolax, Miralax prn Mobilize Limit narcotics senokot  INFECTIOUS A:  AECOPD with PNA. P:   Continue levaquin x 7 days total course>  complete 2/17

## 2012-12-04 LAB — CULTURE, BLOOD (ROUTINE X 2): Culture: NO GROWTH

## 2012-12-04 MED ORDER — ALPRAZOLAM 0.25 MG PO TABS: 0.2500 mg | ORAL_TABLET | Freq: Every evening | ORAL | Status: AC | PRN

## 2012-12-04 MED ORDER — LEVOFLOXACIN 750 MG PO TABS
750.0000 mg | ORAL_TABLET | ORAL | Status: AC
  Administered 2012-12-04: 750 mg via ORAL
  Filled 2012-12-04: qty 1

## 2012-12-04 MED ORDER — PREDNISONE 10 MG PO TABS: ORAL_TABLET | ORAL | Status: DC

## 2012-12-04 NOTE — Progress Notes (Signed)
Physical Therapy Treatment Patient Details Name: EVEN BUDLONG MRN: [id number] DOB: 01/15/1936 Today's Date: 12/04/2012 Time: 4098-1191 PT Time Calculation (min): 25 min  PT Assessment / Plan / Recommendation Comments on Treatment Session  Pt. tolerated ambulating x 2 in hall. Sats 92% on 3 l, HR 90, dyspnea 2-3. Pt. feels he can make it up 9 steps with rails. Informed pt/wife that he could request ambulance ride home. He declined. he will f/u w/ pulmonary for =OP rehab at some point.    Follow Up Recommendations   (pulm. rehab)     Does the patient have the potential to tolerate intense rehabilitation     Barriers to Discharge        Equipment Recommendations  None recommended by PT    Recommendations for Other Services    Frequency Min 3X/week   Plan Discharge plan remains appropriate;Frequency remains appropriate    Precautions / Restrictions Precautions Precaution Comments: monitor O2 Sats   Pertinent Vitals/Pain >92% on 3 l. For mobi;ity    Mobility  Bed Mobility Bed Mobility: Supine to Sit Supine to Sit: 7: Independent Transfers Sit to Stand: 6: Modified independent (Device/Increase time);From chair/3-in-1;With armrests Stand to Sit: 6: Modified independent (Device/Increase time);To chair/3-in-1;With armrests Ambulation/Gait Ambulation/Gait Assistance: 5: Supervision Ambulation Distance (Feet): 160 Feet (and 100) Assistive device: Rolling walker Gait Pattern: Within Functional Limits General Gait Details: used RW appropriately    Exercises     PT Diagnosis:    PT Problem List:   PT Treatment Interventions:     PT Goals Acute Rehab PT Goals Pt will go Supine/Side to Sit: Independently PT Goal: Supine/Side to Sit - Progress: Met Pt will Ambulate: >150 feet;with modified independence PT Goal: Ambulate - Progress: Progressing toward goal  Visit Information  Last PT Received On: 12/04/12 Assistance Needed: +1    Subjective Data  Subjective: I think  I get up my steps   Cognition  Cognition Overall Cognitive Status: Appears within functional limits for tasks assessed/performed    Balance  Balance Balance Assessed: Yes Static Standing Balance Static Standing - Balance Support: No upper extremity supported Static Standing - Level of Assistance: 5: Stand by assistance  End of Session PT - End of Session Equipment Utilized During Treatment: Oxygen Activity Tolerance: Patient tolerated treatment well Patient left: in chair;with call bell/phone within reach;with family/visitor present Nurse Communication: Mobility status   GP     Rada Hay 12/04/2012, 11:57 [id number]

## 2012-12-04 NOTE — Discharge Summary (Signed)
Physician Discharge Summary     Patient ID: [name] MRN: [id number] DOB/AGE: 02/07/1936 77 y.o.  Admit date: 11/27/2012 Discharge date: 12/04/2012  Admission Diagnoses: Dyspnea  Discharge Diagnoses:  Active Problems:   Acute and chronic respiratory failure   COPD exacerbation   CAP (community acquired pneumonia)   Significant Hospital tests/ studies/ interventions and procedures  CHIEF COMPLAINT: "Cant breath"  BRIEF PATIENT DESCRIPTION: 57 yowm quit smoking 14 y pta with severe COPD-O2 dependent w/ 1 day hx of acute onset increased dyspnea and productive cough w/ purulent sputum. 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  VITAL SIGNS:  Temp: [97.9 F (36.6 C)-98.2 F (36.8 C)] 97.9 F (36.6 C) (02/16 0519)  Pulse Rate: [86-93] 86 (02/16 0519)  Resp: [20] 20 (02/16 0519)  BP: (129-135)/(66-75) 135/75 mmHg (02/16 0519)  SpO2: [95 %-98 %] 95 % (02/16 0519)  Weight: [63.413 kg (139 lb 12.8 oz)] 63.413 kg (139 lb 12.8 oz) (02/16 0519)  02 Rx = 4lpm ( vs baseline 3 @home )  Intake/Output  02/15 0701 - 02/16 0700 02/16 0701 - 02/17 0700  P.O. 720  IV Piggyback  Total Intake(mL/kg) 720 (11.4)  Urine (mL/kg/hr) 800 (0.5)  Total Output 800  Net -80  Urine Occurrence 3 x  Stool Occurrence 1 x   PHYSICAL EXAMINATION:  General: Wd male in nad  Neuro: A/o x 3 , moves all 4.  HEENT: nares without d/c or purulence. No LN or TMG  Cardiovascular: rrr  Lungs: Decreased breath sounds, no wheeze  Abdomen: soft /NT , BS  Musculoskeletal: no edema  No cyanosis or calf tenderness.     Hospital Course:   Acute on Chronic Hypoxic Resp Failure 2nd to PNA and  COPD exacerbation  Treated in usual fashion: oxygen, BDs, systemic steroids and antibiotics. He improved with these measures and is now ready for d/c back to home. Completed 7d abx.  P:  Home on usual regimen  pred taper  Close f/u Oxygen 3 liters    Sinus Tachycardia ? due to hypoxia and exacerbation >> improved 2/12.  Hx of CABG in 2009  Hx of SVT  P:  Resumed amlodipine, asa  pravachol at home    Hx of GERD and Barretts  Constipation.  P:  PPI  Dulcolax, Miralax prn  Mobilize  Limit narcotics  senokot    Discharge Exam: BP 121/72  Pulse 90  Temp(Src) 97.9 F (36.6 C) (Oral)  Resp 16  Ht 5\' 11"  (1.803 m)  Wt 63.2 kg (139 lb 5.3 oz)  BMI 19.44 kg/m2  SpO2 98%  PHYSICAL EXAMINATION:  General: Wd male in nad  Neuro: A/o x 3 , moves all 4.  HEENT: nares without d/c or purulence. No LN or TMG  Cardiovascular: rrr  Lungs: Decreased breath sounds, no wheeze  Abdomen: soft /NT , BS  Musculoskeletal: no edema  No cyanosis or calf tenderness.    Labs at discharge Lab Results  Component Value Date   CREATININE 0.63 12/01/2012   BUN 16 12/01/2012   NA 135 12/01/2012   K 4.0 12/01/2012   CL 100 12/01/2012   CO2 26  12/01/2012   Lab Results  Component Value Date   WBC 21.1* 11/29/2012   HGB 14.5 11/29/2012   HCT 42.9 11/29/2012   MCV 95.3 11/29/2012   PLT 220 11/29/2012   Lab Results  Component Value Date   ALT 11 11/27/2012   AST 17 11/27/2012   ALKPHOS 78 11/27/2012   BILITOT 1.3* 11/27/2012   Lab Results  Component Value Date   INR 1.6* 04/12/2008   INR 1.1 04/11/2008    Current radiology studies No results found.  Disposition:  01-Home or Self Care      Discharge Orders   Future Appointments Provider Department Dept Phone   12/08/2012 4:00 PM [facility] [phone]   12/21/2012 2:00 PM Julio Sicks, NP [facility] [phone]   Future Orders Complete By Expires     (HEART FAILURE PATIENTS) Call MD:   Anytime you have any of the following symptoms: 1) 3 pound weight gain in 24 hours or 5 pounds in 1 week 2) shortness of breath, with or without a dry hacking cough 3) swelling in the hands, feet or stomach 4) if you have to sleep on extra pillows at night in order to breathe.  As directed     Call MD for:  temperature >100.4  As directed     Call MD for:  As directed     Diet - low sodium heart healthy  As directed     For home use only DME oxygen  As directed     Questions:      Mode or (Route):  Nasal cannula    Liters per Minute:  3    Frequency:  Continuous    Oxygen conserving device:      Increase activity slowly  As directed         Medication List    STOP taking these medications       [facility] PURELAX powder  Generic drug:  polyethylene glycol powder     Fluticasone-Salmeterol 250-50 MCG/DOSE Aepb  Commonly known as:  ADVAIR      TAKE these medications       ALPRAZolam 0.25 MG tablet  Commonly known as:  XANAX  Take 1 tablet (0.25 mg total) by mouth at bedtime as needed for sleep.     amLODipine 10 MG tablet  Commonly known as:  NORVASC  Take 10 mg by mouth daily.     aspirin EC 325 MG tablet  Take 325 mg by mouth daily.     EQL FISH OIL 1000 MG Caps  Take 2 capsules (2,000 mg total) by mouth daily.     furosemide 20 MG tablet  Commonly known as:  LASIX  Take 1 tablet by mouth Once daily as needed.     hydroxypropyl methylcellulose 2.5 % ophthalmic solution  Commonly known as:  ISOPTO TEARS  Place 1 drop into both eyes daily as needed. For redness     omeprazole 20 MG capsule  Commonly known as:  PRILOSEC  Take 20 mg by mouth daily. 30 minutes before breakfast     pravastatin 80 MG tablet  Commonly known as:  PRAVACHOL  TAKE 1 TABLET BY MOUTH AT BEDTIME     predniSONE 10 MG tablet  Commonly known as:  DELTASONE  Take 4 tabs  daily with food x 4 days, then 3 tabs daily x 4 days, then 2 tabs daily x 4 days, then 1 tab daily x4 days then stop. #40  PROAIR HFA 108 (90 BASE) MCG/ACT inhaler  Generic drug:  albuterol  INHALE 2 PUFFS BY MOUTH EVERY 4 HOURS AS NEEDED FOR SHORTNESS OF BREATH /WHEEZE     albuterol (2.5 MG/3ML) 0.083% nebulizer solution  Commonly known as:  PROVENTIL  Take 3 mLs (2.5 mg total) by nebulization every 6 (six) hours as needed. DX:  496     tiotropium 18 MCG inhalation capsule  Commonly known as:  SPIRIVA  Place 1 capsule (18 mcg total) into inhaler and inhale daily.       Follow-up Information   Follow up with PARRETT,TAMMY, NP On 12/11/2012. (10am )    Contact information:   [address] [place] [address] [phone]       Discharged Condition: good  Physician Statement:   The Patient was personally examined, the discharge assessment and plan has been personally reviewed and I agree with ACNP Babcock's assessment and plan. > 30 minutes of time have been dedicated to discharge assessment, planning and discharge instructions.   SignedAnders Simmonds 12/04/2012, 12:27 PM    Sandrea Hughs, MD Pulmonary and Critical Care Medicine [facility] Cell [phone] After 5:30 PM or weekends, call [phone]

## 2012-12-06 ENCOUNTER — Telehealth: Payer: Self-pay | Admitting: Pulmonary Disease

## 2012-12-06 DIAGNOSIS — J449 Chronic obstructive pulmonary disease, unspecified: Secondary | ICD-10-CM

## 2012-12-06 NOTE — Telephone Encounter (Signed)
ATC spouse line busy X3

## 2012-12-07 NOTE — Telephone Encounter (Signed)
Ok to send order to [facility] 

## 2012-12-07 NOTE — Telephone Encounter (Signed)
Order has been sent to [facility]. I spoke with spouse and is aware of this. Please advise PCC's thanks

## 2012-12-07 NOTE — Telephone Encounter (Signed)
Called, spoke with pt's wife.  States pt's current portable system funs out of o2 within 45 minutes to 1 hour because pt is now on a cont flow since being d/c'd from hospital.  They would like an order sent to [facility] for a different portable o2 system that will last longer when pt is out.  Dr. Vassie Loll, pls advise if you are ok with this.  Thank you.

## 2012-12-08 NOTE — Telephone Encounter (Signed)
Order given to [facility] ° °

## 2012-12-11 ENCOUNTER — Encounter: Payer: Self-pay | Admitting: Adult Health

## 2012-12-11 ENCOUNTER — Ambulatory Visit (INDEPENDENT_AMBULATORY_CARE_PROVIDER_SITE_OTHER): Payer: Medicare Other | Admitting: Adult Health

## 2012-12-11 ENCOUNTER — Ambulatory Visit (INDEPENDENT_AMBULATORY_CARE_PROVIDER_SITE_OTHER)
Admission: RE | Admit: 2012-12-11 | Discharge: 2012-12-11 | Disposition: A | Discharge status: Home / Self Care | Payer: Medicare Other | Source: Ambulatory Visit | Attending: Adult Health | Admitting: Adult Health

## 2012-12-11 VITALS — BP 122/68 | HR 84 | Temp 97.3°F | Ht 71.0 in | Wt 141.6 lb

## 2012-12-11 DIAGNOSIS — J449 Chronic obstructive pulmonary disease, unspecified: Secondary | ICD-10-CM

## 2012-12-11 DIAGNOSIS — J189 Pneumonia, unspecified organism: Secondary | ICD-10-CM

## 2012-12-11 NOTE — Progress Notes (Signed)
Subjective:    Patient ID: [name], male    DOB: 1935-11-29, 77 y.o.   MRN: [id number] HPI PMD - [name]   76/M, ex- smoker- severe COPD on nocturnal O2 since 5/11 & cough.  5/10 PFTs >> severe obstruction, FEV1 39%, no BD response  He developed a chronic cough.since his heart surgery by [name] in 6/09  Barrett's esophagus 3 cm noted to be stable by [name].  H/o sinus surgery, tonsillectomy  CT chest 4/09 severe emphysema, scattered sub-cm nodules stable since 2005  Hosp adm in 2/12 >> for cough syncope - admitted to tele but transferred to 2900 for another syncopal event  CT chest 11/16/10 neg PE, stable nodules, small RLL infx Rxed for HCAP with cipro & zosyn  Modified Ba swallow nml  Ct angio 2/12 did not show any suspicious nodules   11/04/2011 Started on Daliresp by Primary MD - stopped 4/13, makes him 'mean' , has lost wt ,  4/13 ono on RA showed only 2 min desatn < 88% -has been off O2   08/23/2012 - happy b'day!   07/03/2012 Acute OV  >> augmentin, pred, trial of tudorza  Acute OV - 10/13 ([facility]) - CXR - bibasal scarring, nipple shadow clarified with markers He did not see improvement in his breathing with Spiriva, and was given a trial of tudorza at the last visit. He did not see any change in his breathing with any of these therapies >> trial of prednisone C/o good and bad days w/ breathing, occasional cough, wheezing, chest tx. uses oxygen PRN during day and everynight at bedtime No hemoptysis or chest pain . No edema.   11/27/2012 Acute OV  Complains of Lots of wheezing and SOB x 2 day. Reports chills.  Complains of cough, congestion and thick mucus. Green thick mucus.  Denies chest pain or chest tightness. No fever, hemotpysis, or fever. Some lower ext swelling.  On arrival to office 83% on 4 l/m O2.  Will require admission to Hospital .  >admitted   12/11/2012 Post Hospital follow up  Patient returns for a post hospital followup. The patient was admitted  February 10 through 12/04/2012 for acute COPD exacerbation and community-acquired pneumonia. Pt  was discharged on prednisone taper. He  was treated with a seven-day course of antibiotics,  Discharged on steroid taper, and aggressive nebulized bronchodilators. . Advair was stopped.,  Sputum showed abundant candida   Since discharge. Patient is feeling better. Doing well since discharge; reports still weak/fatigued.  currently on 3 tabs x3days on pred taper Chest x-ray today shows COPD changes, without any acute infiltrates noted. No hemoptysis, no fever.  Coughing clear mucus Feels flutter valve is really helping.        Review of Systems Constitutional:   No  weight loss, night sweats,  Fevers, chills, fatigue, or  lassitude.  HEENT:   No headaches,  Difficulty swallowing,  Tooth/dental problems, or  Sore throat,                No sneezing, itching, ear ache,  +nasal congestion, post nasal drip,   CV:  No chest pain,  Orthopnea, PND, swelling in lower extremities, anasarca, dizziness, palpitations, syncope.   GI  No heartburn, indigestion, abdominal pain, nausea, vomiting, diarrhea, change in bowel habits, loss of appetite, bloody stools.   Resp:    No coughing up of blood.    No chest wall deformity  Skin: no rash or lesions.  GU: no dysuria, change  in color of urine, no urgency or frequency.  No flank pain, no hematuria   MS:  No joint pain or swelling.  No decreased range of motion.  No back pain.  Psych:  No change in mood or affect. No depression or anxiety.  No memory loss.         Objective:   Physical Exam  Gen. Pleasant, tachypneic, increased WOB  ENT - no lesions, no post nasal drip Neck: No JVD, no thyromegaly, no carotid bruits Lungs: diminished BS in bases  Cardiovascular: Rhythm regular, heart sounds  normal, no murmurs or gallops,  no  peripheral edema Musculoskeletal: No deformities, no cyanosis or clubbing        Assessment & Plan:

## 2012-12-11 NOTE — Patient Instructions (Addendum)
Continue on Spiriva daily, brush rinse and gargle after use. Continue on oxygen 3 L continuous flow. Taper prednisone to off as directed  Followup with Dr. Vassie Loll in 4 weeks and as needed. Please contact office for sooner follow up if symptoms do not improve or worsen or seek emergency care

## 2012-12-11 NOTE — Assessment & Plan Note (Signed)
Recent exacerbation, now resolved. Patient is now off of Advair to 2 oropharyngeal candidiasis. Patient continue on Spiriva daily. Taper off prednisone as directed to off. He will follow back in 4 weeks at Dr Vassie Loll

## 2012-12-20 DIAGNOSIS — J449 Chronic obstructive pulmonary disease, unspecified: Secondary | ICD-10-CM | POA: Diagnosis not present

## 2012-12-20 DIAGNOSIS — IMO0002 Reserved for concepts with insufficient information to code with codable children: Secondary | ICD-10-CM | POA: Diagnosis not present

## 2012-12-20 DIAGNOSIS — I2510 Atherosclerotic heart disease of native coronary artery without angina pectoris: Secondary | ICD-10-CM | POA: Diagnosis not present

## 2012-12-20 DIAGNOSIS — R0789 Other chest pain: Secondary | ICD-10-CM | POA: Diagnosis not present

## 2012-12-21 ENCOUNTER — Telehealth: Payer: Self-pay | Admitting: Pulmonary Disease

## 2012-12-21 ENCOUNTER — Ambulatory Visit: Payer: Medicare Other | Admitting: Adult Health

## 2012-12-21 DIAGNOSIS — J449 Chronic obstructive pulmonary disease, unspecified: Secondary | ICD-10-CM

## 2012-12-21 NOTE — Telephone Encounter (Signed)
Currently using [facility] and they used to come out and exchange the O2 tanks for them but they aren't doing that for them any more, they are making his wife bring the tanks to them.   They are wanting to switch to another DME company.  Dr. Vassie Loll please advise. Thanks.

## 2012-12-21 NOTE — Telephone Encounter (Signed)
Pl fwd to [facility]

## 2012-12-27 NOTE — Telephone Encounter (Signed)
Order placed to change DME companies. Carron Curie, CMA

## 2013-01-08 ENCOUNTER — Encounter: Payer: Self-pay | Admitting: Pulmonary Disease

## 2013-01-08 ENCOUNTER — Ambulatory Visit (INDEPENDENT_AMBULATORY_CARE_PROVIDER_SITE_OTHER): Payer: Medicare Other | Admitting: Pulmonary Disease

## 2013-01-08 VITALS — BP 122/78 | HR 79 | Temp 97.4°F | Ht 71.0 in | Wt 145.0 lb

## 2013-01-08 DIAGNOSIS — J449 Chronic obstructive pulmonary disease, unspecified: Secondary | ICD-10-CM | POA: Diagnosis not present

## 2013-01-08 DIAGNOSIS — J441 Chronic obstructive pulmonary disease with (acute) exacerbation: Secondary | ICD-10-CM | POA: Diagnosis not present

## 2013-01-08 NOTE — Assessment & Plan Note (Signed)
Resolved Would benefit from pulm rehab

## 2013-01-08 NOTE — Patient Instructions (Signed)
Spiriva sample Use  3L/m walking & during sleep - Ok to stay off oxygen at rest You may qualify for pulse o2 Reschedule PFT so we can get you into rehab at [facility] Use multivitamin pill daily for appetite 

## 2013-01-08 NOTE — Assessment & Plan Note (Signed)
Spiriva sample Use  3L/m walking & during sleep - Ok to stay off oxygen at rest You may qualify for pulse o2 Reschedule PFT so we can get you into rehab at [facility] Use multivitamin pill daily for appetite

## 2013-01-08 NOTE — Progress Notes (Signed)
  Subjective:    Patient ID: [name], male    DOB: 11/26/1935, 77 y.o.   MRN: [id number]  HPI  PMD - [name]   76/M, ex- smoker- severe COPD on nocturnal O2 since 5/11 & cough.  5/10 PFTs >> severe obstruction, FEV1 39%, no BD response  He developed a chronic cough.since his heart surgery by [name] in 6/09  Barrett's esophagus 3 cm noted to be stable by [name].  H/o sinus surgery, tonsillectomy  CT chest 4/09 severe emphysema, scattered sub-cm nodules stable since 2005  Hosp adm in 2/12 >> for cough syncope - admitted to tele but transferred to 2900 for another syncopal event  CT chest 11/16/10 neg PE, stable nodules, small RLL infx Rxed for HCAP with cipro & zosyn  Modified Ba swallow nml  Ct angio 2/12 did not show any suspicious nodules  11/04/2011 Started on Daliresp by Primary MD - stopped 4/13, makes him 'mean' , has lost wt ,  4/13 ono on RA showed only 2 min desatn < 88% -has been off O2  He did not see improvement in his breathing with Spiriva, and was given a trial of tudorza. He did not see any change in his breathing >> trial of prednisone     01/08/2013  11/27/2012 Acute OV  83% on 4 l/m O2.  Admitted February 10 through 12/04/2012 for acute COPD exacerbation and community-acquired pneumonia. Pt was discharged on prednisone taper. He was treated with a seven-day course of antibiotics,Advair was stopped.,  Since discharge. Patient is feeling better. Doing well since discharge; reports still weak/fatigued. currently on 3 tabs x3days on pred taper  No hemoptysis, no fever.  Coughing clear mucus  Feels flutter valve is really helping.   C/o cough w/ clear phlem, occasional wheezing comes and goes. denies any chest tx, CP, nasal congestion. satn 88% RA Feels back to baseline, CXR 2/14 reviewed - clear  Review of Systems neg for any significant sore throat, dysphagia, itching, sneezing, nasal congestion or excess/ purulent secretions, fever, chills, sweats,  unintended wt loss, pleuritic or exertional cp, hempoptysis, orthopnea pnd or change in chronic leg swelling. Also denies presyncope, palpitations, heartburn, abdominal pain, nausea, vomiting, diarrhea or change in bowel or urinary habits, dysuria,hematuria, rash, arthralgias, visual complaints, headache, numbness weakness or ataxia.     Objective:   Physical Exam   Gen. Pleasant, well-nourished, in no distress ENT - no lesions, no post nasal drip Neck: No JVD, no thyromegaly, no carotid bruits Lungs: no use of accessory muscles, no dullness to percussion, decreased BL without rales or rhonchi  Cardiovascular: Rhythm regular, heart sounds  normal, no murmurs or gallops, no peripheral edema Musculoskeletal: No deformities, no cyanosis or clubbing         Assessment & Plan:

## 2013-01-10 ENCOUNTER — Ambulatory Visit (INDEPENDENT_AMBULATORY_CARE_PROVIDER_SITE_OTHER): Payer: Medicare Other | Admitting: Pulmonary Disease

## 2013-01-10 DIAGNOSIS — J449 Chronic obstructive pulmonary disease, unspecified: Secondary | ICD-10-CM

## 2013-01-10 DIAGNOSIS — R05 Cough: Secondary | ICD-10-CM

## 2013-01-10 LAB — PULMONARY FUNCTION TEST

## 2013-01-10 NOTE — Progress Notes (Signed)
Spirometry before and after done today. 

## 2013-01-15 ENCOUNTER — Telehealth: Payer: Self-pay | Admitting: Pulmonary Disease

## 2013-01-15 DIAGNOSIS — J449 Chronic obstructive pulmonary disease, unspecified: Secondary | ICD-10-CM

## 2013-01-15 NOTE — Telephone Encounter (Signed)
FEV1 34 % -slight drop from earlier - will benefit from rehab at [facility]

## 2013-01-16 NOTE — Telephone Encounter (Signed)
I spoke with spouse and is aware. Order placed.

## 2013-01-19 DIAGNOSIS — H18419 Arcus senilis, unspecified eye: Secondary | ICD-10-CM | POA: Diagnosis not present

## 2013-01-19 DIAGNOSIS — H3530 Unspecified macular degeneration: Secondary | ICD-10-CM | POA: Diagnosis not present

## 2013-01-19 DIAGNOSIS — Z961 Presence of intraocular lens: Secondary | ICD-10-CM | POA: Diagnosis not present

## 2013-01-28 ENCOUNTER — Emergency Department (HOSPITAL_COMMUNITY): Payer: Medicare Other

## 2013-01-28 ENCOUNTER — Emergency Department (HOSPITAL_COMMUNITY)
Admission: EM | Admit: 2013-01-28 | Discharge: 2013-01-28 | Disposition: A | Discharge status: Home / Self Care | Payer: Medicare Other | Attending: Emergency Medicine | Admitting: Emergency Medicine

## 2013-01-28 ENCOUNTER — Encounter (HOSPITAL_COMMUNITY): Payer: Self-pay | Admitting: Family Medicine

## 2013-01-28 DIAGNOSIS — Z87891 Personal history of nicotine dependence: Secondary | ICD-10-CM | POA: Diagnosis not present

## 2013-01-28 DIAGNOSIS — Z862 Personal history of diseases of the blood and blood-forming organs and certain disorders involving the immune mechanism: Secondary | ICD-10-CM | POA: Insufficient documentation

## 2013-01-28 DIAGNOSIS — K2270 Barrett's esophagus without dysplasia: Secondary | ICD-10-CM | POA: Diagnosis not present

## 2013-01-28 DIAGNOSIS — Z8719 Personal history of other diseases of the digestive system: Secondary | ICD-10-CM | POA: Insufficient documentation

## 2013-01-28 DIAGNOSIS — Z8701 Personal history of pneumonia (recurrent): Secondary | ICD-10-CM | POA: Diagnosis not present

## 2013-01-28 DIAGNOSIS — J438 Other emphysema: Secondary | ICD-10-CM | POA: Diagnosis not present

## 2013-01-28 DIAGNOSIS — R609 Edema, unspecified: Secondary | ICD-10-CM | POA: Diagnosis not present

## 2013-01-28 DIAGNOSIS — I2510 Atherosclerotic heart disease of native coronary artery without angina pectoris: Secondary | ICD-10-CM | POA: Insufficient documentation

## 2013-01-28 DIAGNOSIS — Z79899 Other long term (current) drug therapy: Secondary | ICD-10-CM | POA: Diagnosis not present

## 2013-01-28 DIAGNOSIS — J439 Emphysema, unspecified: Secondary | ICD-10-CM

## 2013-01-28 DIAGNOSIS — I129 Hypertensive chronic kidney disease with stage 1 through stage 4 chronic kidney disease, or unspecified chronic kidney disease: Secondary | ICD-10-CM | POA: Insufficient documentation

## 2013-01-28 DIAGNOSIS — K219 Gastro-esophageal reflux disease without esophagitis: Secondary | ICD-10-CM | POA: Diagnosis not present

## 2013-01-28 DIAGNOSIS — E785 Hyperlipidemia, unspecified: Secondary | ICD-10-CM | POA: Diagnosis not present

## 2013-01-28 DIAGNOSIS — N183 Chronic kidney disease, stage 3 unspecified: Secondary | ICD-10-CM | POA: Diagnosis not present

## 2013-01-28 LAB — CBC
Hemoglobin: 15.1 g/dL (ref 13.0–17.0)
MCH: 32.8 pg (ref 26.0–34.0)
MCV: 92.2 fL (ref 78.0–100.0)
Platelets: 249 10*3/uL (ref 150–400)
RBC: 4.6 MIL/uL (ref 4.22–5.81)
WBC: 7.7 10*3/uL (ref 4.0–10.5)

## 2013-01-28 LAB — BASIC METABOLIC PANEL
CO2: 28 mEq/L (ref 19–32)
Calcium: 8.7 mg/dL (ref 8.4–10.5)
Chloride: 100 mEq/L (ref 96–112)
Creatinine, Ser: 0.71 mg/dL (ref 0.50–1.35)
Glucose, Bld: 104 mg/dL — ABNORMAL HIGH (ref 70–99)
Sodium: 137 mEq/L (ref 135–145)

## 2013-01-28 LAB — PRO B NATRIURETIC PEPTIDE: Pro B Natriuretic peptide (BNP): 469.3 pg/mL — ABNORMAL HIGH (ref 0–125)

## 2013-01-28 LAB — POCT I-STAT TROPONIN I: Troponin i, poc: 0 ng/mL (ref 0.00–0.08)

## 2013-01-28 NOTE — ED Provider Notes (Signed)
Medical screening examination/treatment/procedure(s) were conducted as a shared visit with non-physician practitioner(s) and myself.  I personally evaluated the patient during the encounter  Results for orders placed during the hospital encounter of 01/28/13  CBC      Result Value Range   WBC 7.7  4.0 - 10.5 K/uL   RBC 4.60  4.22 - 5.81 MIL/uL   Hemoglobin 15.1  13.0 - 17.0 g/dL   HCT 81.1  91.4 - 78.2 %   MCV 92.2  78.0 - 100.0 fL   MCH 32.8  26.0 - 34.0 pg   MCHC 35.6  30.0 - 36.0 g/dL   RDW 95.6  21.3 - 08.6 %   Platelets 249  150 - 400 K/uL  BASIC METABOLIC PANEL      Result Value Range   Sodium 137  135 - 145 mEq/L   Potassium 3.2 (*) 3.5 - 5.1 mEq/L   Chloride 100  96 - 112 mEq/L   CO2 28  19 - 32 mEq/L   Glucose, Bld 104 (*) 70 - 99 mg/dL   BUN 8  6 - 23 mg/dL   Creatinine, Ser 5.78  0.50 - 1.35 mg/dL   Calcium 8.7  8.4 - 46.9 mg/dL   GFR calc non Af Amer >90  >90 mL/min   GFR calc Af Amer >90  >90 mL/min  PRO B NATRIURETIC PEPTIDE      Result Value Range   Pro B Natriuretic peptide (BNP) 469.3 (*) 0 - 125 pg/mL  POCT I-STAT TROPONIN I      Result Value Range   Troponin i, poc 0.00  0.00 - 0.08 ng/mL   Comment 3             The patient seen by me. Patient lower trimming the edema is actually improved here in the emergency department severely minimal currently. Patient does have a history of renal problems and congestive heart failure. No evidence that today chest x-rays negative actually his BNP is markedly improved from just a few weeks ago. Set the 400 range. Renal function is normal. Chest x-ray as stated the without evidence of pulmonary edema.  Shelda Jakes, MD 01/28/13 [phone]

## 2013-01-28 NOTE — ED Provider Notes (Signed)
Medical screening examination/treatment/procedure(s) were conducted as a shared visit with non-physician practitioner(s) and myself.  I personally evaluated the patient during the encounter   Shelda Jakes, MD 01/28/13 [phone]

## 2013-01-28 NOTE — ED Notes (Signed)
Per pt sts a few days of BLE edema. No other complaints.

## 2013-01-28 NOTE — ED Provider Notes (Addendum)
History     CSN: [id number]  Arrival date & time 01/28/13  1600   First MD Initiated Contact with Patient 01/28/13 1636      Chief Complaint  Patient presents with  . Leg Swelling    (Consider location/radiation/quality/duration/timing/severity/associated sxs/prior treatment) HPI  PCP: DR. Vassie Loll Cardiologist: [facility] Nephrologist:   Patient brought to the ED by his partner for bilateral lower extremity swelling for 1-2 days. Last night she felt as though Waynes legs looked streaky red and swollen. Today they are only swollen. He has no complaints and says his legs do not hurt him. He has a significant past medical history. He was release from the hospital after a 9 day stay in February for pneumonia. He has recently been diagnosed with renal failure- not on dialysis. He has positive CHF, no pacemaker. He has had coronary artery bypass. He has previous diagnosis of multiple myeloma. He has severe COPD and has at home oxygen. He has been checking his oxygen levels at home and they have stayed greater than 90%.  He has a chronic cough due to barrets esophagitis which is a little bit worse today than normal. He says he does not feel SOB, CP, weakness, headache, chest pain, abdominal pains, nauseous, gen/focal weakness, fevers. He says he  Feels fine but his wife wants his legs looked at to make sure everything is okay.  Past Medical History  Diagnosis Date  . Hypertension   . Hyperlipidemia     excellent control  . Chronic kidney disease, stage III (moderate)   . Barrett's esophagus     frequent surveillance EGDs by Dr. Jena Gauss  . Chronic obstructive pulmonary disease     severe emphysema  . Cough, persistent   . Hyperkalemia     peak of 6.2 in 2009  . Colonic polyp     resected 2007 via colonoscope  . Diverticulosis   . Pneumonia     10/2010  . Coronary artery disease     CABG in 03/2008 for severe left main disease with critical RCA stenosis; EF of 45-50% with  inferior wall hypokinesis; echocardiogram in 10/2008-no change in LV function; stress nuclear-conflicting information in report indicating inferior reversibility but inferior scar  . Gastroesophageal reflux disease   . Peptic ulcer disease     perforation and surgical repair at [facility] in 1999; treated for positive Helicobacter pylori  . History of orthostatic hypotension     noted following CABG surgery  . COPD (chronic obstructive pulmonary disease)     Past Surgical History  Procedure Laterality Date  . Laparoscopic gastrotomy w/ repair of ulcer      1999  . Coronary artery bypass graft  03/2008  . Colon surgery    . Cardiac surgery      Family History  Problem Relation Age of Onset  . Heart disease Mother   . Heart disease Father     History  Substance Use Topics  . Smoking status: Former Smoker -- 1.00 packs/day for 20 years    Types: Cigarettes, Pipe, Cigars    Quit date: 01/12/1998  . Smokeless tobacco: Never Used  . Alcohol Use: Yes     Comment: rare wine      Review of Systems  All other systems reviewed and are negative.    Allergies  Review of patient's allergies indicates no known allergies.  Home Medications   Current Outpatient Rx  Name  Route  Sig  Dispense  Refill  . acetaminophen (  TYLENOL) 500 MG tablet   Oral   Take 500 mg by mouth every 6 (six) hours as needed for pain.         Marland Kitchen ALPRAZolam (XANAX) 0.25 MG tablet   Oral   Take 1 tablet (0.25 mg total) by mouth at bedtime as needed for sleep.   30 tablet   0   . amLODipine (NORVASC) 10 MG tablet   Oral   Take 10 mg by mouth daily.          Marland Kitchen aspirin EC 325 MG tablet   Oral   Take 325 mg by mouth daily.         Marland Kitchen docusate sodium (COLACE) 100 MG capsule   Oral   Take 200 mg by mouth at bedtime as needed for constipation.          . furosemide (LASIX) 20 MG tablet   Oral   Take 1 tablet by mouth Once daily as needed.         Marland Kitchen HYDROcodone-acetaminophen (NORCO/VICODIN)  5-325 MG per tablet   Oral   Take 1 tablet by mouth every 6 (six) hours as needed for pain.         . hydroxypropyl methylcellulose (ISOPTO TEARS) 2.5 % ophthalmic solution   Both Eyes   Place 1 drop into both eyes daily as needed. For redness         . Omega-3 Fatty Acids (EQL FISH OIL) 1000 MG CAPS   Oral   Take 2 capsules (2,000 mg total) by mouth daily.   60 capsule   3   . omeprazole (PRILOSEC) 20 MG capsule   Oral   Take 20 mg by mouth daily. 30 minutes before breakfast         . pravastatin (PRAVACHOL) 80 MG tablet   Oral   Take 80 mg by mouth at bedtime.         Marland Kitchen tiotropium (SPIRIVA) 18 MCG inhalation capsule   Inhalation   Place 1 capsule (18 mcg total) into inhaler and inhale daily.   30 capsule   5     BP 117/73  Pulse 78  Temp(Src) 98.3 F (36.8 C)  Resp 17  Ht 5\' 8"  (1.727 m)  Wt 139 lb (63.05 kg)  BMI 21.14 kg/m2  SpO2 98%  Physical Exam  Nursing note and vitals reviewed. Constitutional: He appears well-developed and well-nourished. No distress.  HENT:  Head: Normocephalic and atraumatic.  Eyes: Pupils are equal, round, and reactive to light.  Neck: Normal range of motion. Neck supple.  Cardiovascular: Normal rate and regular rhythm.   Pulmonary/Chest: Effort normal.  Abdominal: Soft.  Musculoskeletal:  Significant leg edema which appears chronic. No redness or streaking noted. No induration. Bilateral pedal pulses are physiologic.  Neurological: He is alert.  Skin: Skin is warm and dry.    ED Course  Procedures (including critical care time)  Labs Reviewed  BASIC METABOLIC PANEL - Abnormal; Notable for the following:    Potassium 3.2 (*)    Glucose, Bld 104 (*)    All other components within normal limits  PRO B NATRIURETIC PEPTIDE - Abnormal; Notable for the following:    Pro B Natriuretic peptide (BNP) 469.3 (*)    All other components within normal limits  CBC  POCT I-STAT TROPONIN I   Dg Chest Port 1 View  01/28/2013   *RADIOLOGY REPORT*  Clinical Data: Lower extremity swelling.  COPD.  PORTABLE CHEST - 1 VIEW  Comparison:  PA and lateral chest 12/11/2012 and 01/31/2012.  Single view of the chest 11/27/2012 and CT chest 10/06/2012.  Findings: The lungs are severely emphysematous.  There is some basilar scarring.  No consolidative process, pneumothorax or effusion.  Heart size is normal.  The patient is status post CABG.  IMPRESSION: Severe emphysema without acute disease.   Original Report Authenticated By: Holley Dexter, M.D.      1. Pitting edema   2. Emphysema       MDM   Date: 01/28/2013  Rate: 79  Rhythm: sinus rhythm  QRS Axis: normal  Intervals: normal  ST/T Wave abnormalities: normal  Conduction Disutrbances:multiple premature ventricular contractions  Narrative Interpretation:   Old EKG Reviewed: unchanged from Nov 27, 2012  6:00pm- Fortunately labs and chest xray unremarkable from baseline.  kidney function, troponin and WNL. BNP is much improved from previous visits. Chest xray shows no new findings.  Dr. Deretha Emory has seen patient as well. Patient will follow-up with PCP Dr. Sherwood Gambler.  Pt has been advised of the symptoms that warrant their return to the ED. Patient has voiced understanding and has agreed to follow-up with the PCP or specialist.        Dorthula Matas, PA-C 01/28/13 1808  Dorthula Matas, PA-C 02/13/13 5366

## 2013-01-29 ENCOUNTER — Encounter: Payer: Self-pay | Admitting: Pulmonary Disease

## 2013-02-05 ENCOUNTER — Encounter (HOSPITAL_COMMUNITY): Payer: Self-pay | Admitting: *Deleted

## 2013-02-06 ENCOUNTER — Encounter (HOSPITAL_COMMUNITY): Payer: Self-pay

## 2013-02-06 ENCOUNTER — Encounter (HOSPITAL_COMMUNITY)
Admission: RE | Admit: 2013-02-06 | Discharge: 2013-02-06 | Disposition: A | Discharge status: Home / Self Care | Payer: Medicare Other | Source: Ambulatory Visit | Attending: Pulmonary Disease | Admitting: Pulmonary Disease

## 2013-02-06 VITALS — BP 110/60 | HR 73 | Ht 68.0 in | Wt 141.5 lb

## 2013-02-06 DIAGNOSIS — J4489 Other specified chronic obstructive pulmonary disease: Secondary | ICD-10-CM | POA: Insufficient documentation

## 2013-02-06 DIAGNOSIS — J449 Chronic obstructive pulmonary disease, unspecified: Secondary | ICD-10-CM | POA: Diagnosis not present

## 2013-02-06 DIAGNOSIS — Z5189 Encounter for other specified aftercare: Secondary | ICD-10-CM | POA: Insufficient documentation

## 2013-02-06 NOTE — Progress Notes (Signed)
Patient referred to Pulmonary Rehab by Dr. Vassie Loll for COPD 496. During orientation advised patient on arrival and appointment times what to wear, what to do before, during and after exercise. Reviewed attendance and class policy. Talked about inclement weather and class consultation policy. Pt is scheduled to start Pulm  Rehab on 02/19/13 at 1 pm. Pt was advised to come to class 5 minutes before class starts. He was also given instructions on meeting with the dietician and attending the Family Structure classes. Pt is eager to get started. Pt did 6 minute pre-walk test. Talked about pursed-lip breathing.

## 2013-02-06 NOTE — Patient Instructions (Signed)
Pt has finished orientation and is scheduled to start PR on 02/19/13 at 1 pm. Pt has been instructed to arrive to class 15 minutes early for scheduled class. Pt has been instructed to wear comfortable clothing and shoes with rubber soles. Pt has been told to take their medications 1 hour prior to coming to class.  If the patient is not going to attend class, he/she has been instructed to call.   

## 2013-02-08 DIAGNOSIS — I10 Essential (primary) hypertension: Secondary | ICD-10-CM | POA: Diagnosis not present

## 2013-02-08 DIAGNOSIS — I2510 Atherosclerotic heart disease of native coronary artery without angina pectoris: Secondary | ICD-10-CM | POA: Diagnosis not present

## 2013-02-08 DIAGNOSIS — IMO0002 Reserved for concepts with insufficient information to code with codable children: Secondary | ICD-10-CM | POA: Diagnosis not present

## 2013-02-08 DIAGNOSIS — J449 Chronic obstructive pulmonary disease, unspecified: Secondary | ICD-10-CM | POA: Diagnosis not present

## 2013-02-13 NOTE — ED Provider Notes (Signed)
Medical screening examination/treatment/procedure(s) were conducted as a shared visit with non-physician practitioner(s) and myself.  I personally evaluated the patient during the encounter   Shelda Jakes, MD 02/13/13 1524

## 2013-02-19 ENCOUNTER — Telehealth: Payer: Self-pay | Admitting: Pulmonary Disease

## 2013-02-19 ENCOUNTER — Encounter (HOSPITAL_COMMUNITY)
Admission: RE | Admit: 2013-02-19 | Discharge: 2013-02-19 | Disposition: A | Discharge status: Home / Self Care | Payer: Medicare Other | Source: Ambulatory Visit | Attending: Pulmonary Disease | Admitting: Pulmonary Disease

## 2013-02-19 DIAGNOSIS — J449 Chronic obstructive pulmonary disease, unspecified: Secondary | ICD-10-CM | POA: Insufficient documentation

## 2013-02-19 DIAGNOSIS — Z5189 Encounter for other specified aftercare: Secondary | ICD-10-CM | POA: Insufficient documentation

## 2013-02-19 DIAGNOSIS — J4489 Other specified chronic obstructive pulmonary disease: Secondary | ICD-10-CM | POA: Insufficient documentation

## 2013-02-19 NOTE — Telephone Encounter (Signed)
Samples have been placed up front for pick up. Pt's wife is aware.

## 2013-02-21 ENCOUNTER — Encounter (HOSPITAL_COMMUNITY)
Admission: RE | Admit: 2013-02-21 | Discharge: 2013-02-21 | Disposition: A | Discharge status: Home / Self Care | Payer: Medicare Other | Source: Ambulatory Visit | Attending: Pulmonary Disease | Admitting: Pulmonary Disease

## 2013-02-26 ENCOUNTER — Encounter (HOSPITAL_COMMUNITY)
Admission: RE | Admit: 2013-02-26 | Discharge: 2013-02-26 | Disposition: A | Discharge status: Home / Self Care | Payer: Medicare Other | Source: Ambulatory Visit | Attending: Pulmonary Disease | Admitting: Pulmonary Disease

## 2013-02-28 ENCOUNTER — Encounter (HOSPITAL_COMMUNITY)
Admission: RE | Admit: 2013-02-28 | Discharge: 2013-02-28 | Disposition: A | Discharge status: Home / Self Care | Payer: Medicare Other | Source: Ambulatory Visit | Attending: Pulmonary Disease | Admitting: Pulmonary Disease

## 2013-03-05 ENCOUNTER — Encounter (HOSPITAL_COMMUNITY)
Admission: RE | Admit: 2013-03-05 | Discharge: 2013-03-05 | Disposition: A | Discharge status: Home / Self Care | Payer: Medicare Other | Source: Ambulatory Visit | Attending: Pulmonary Disease | Admitting: Pulmonary Disease

## 2013-03-07 ENCOUNTER — Encounter (HOSPITAL_COMMUNITY)
Admission: RE | Admit: 2013-03-07 | Discharge: 2013-03-07 | Disposition: A | Discharge status: Home / Self Care | Payer: Medicare Other | Source: Ambulatory Visit | Attending: Pulmonary Disease | Admitting: Pulmonary Disease

## 2013-03-12 ENCOUNTER — Encounter (HOSPITAL_COMMUNITY): Payer: Medicare Other

## 2013-03-13 ENCOUNTER — Ambulatory Visit (INDEPENDENT_AMBULATORY_CARE_PROVIDER_SITE_OTHER): Payer: Medicare Other | Admitting: Adult Health

## 2013-03-13 ENCOUNTER — Encounter: Payer: Self-pay | Admitting: Adult Health

## 2013-03-13 VITALS — BP 122/74 | HR 77 | Temp 97.8°F | Ht 68.0 in | Wt 145.0 lb

## 2013-03-13 DIAGNOSIS — J449 Chronic obstructive pulmonary disease, unspecified: Secondary | ICD-10-CM | POA: Diagnosis not present

## 2013-03-13 NOTE — Progress Notes (Signed)
  Subjective:    Patient ID: [name], male    DOB: [birth year], 77 y.o.   MRN: [id number]  HPI PMD - [name]   76/M, ex- smoker- severe COPD on nocturnal O2 since 5/11 & cough.  5/10 PFTs >> severe obstruction, FEV1 39%, no BD response  He developed a chronic cough.since his heart surgery by [name] in 6/09  Barrett's esophagus 3 cm noted to be stable by [name].  H/o sinus surgery, tonsillectomy  CT chest 4/09 severe emphysema, scattered sub-cm nodules stable since 2005  Hosp adm in 2/12 >> for cough syncope - admitted to tele but transferred to 2900 for another syncopal event  CT chest 11/16/10 neg PE, stable nodules, small RLL infx Rxed for HCAP with cipro & zosyn  Modified Ba swallow nml  Ct angio 2/12 did not show any suspicious nodules  11/04/2011 Started on Daliresp by Primary MD - stopped 4/13, makes him 'mean' , has lost wt ,  4/13 ono on RA showed only 2 min desatn < 88% -has been off O2  He did not see improvement in his breathing with Spiriva, and was given a trial of tudorza. He did not see any change in his breathing >> trial of prednisone  11/27/12 AECOPD/CAP admission >CXR 2/14 - clear   03/13/2013 Follow up for COPD  Returns for a 3 month follow up for COPD  Continues at his baseline , has good and bad days.  Last visit, retry of Spiriva.  Feels breathing may be slightly better.  Walking more.  Continues on on O2 at 3L/m walking & during sleep -   Referred to pulmonary rehab. Feels better in program.  CAT score today >21.  Was seen in ER 1 month ago for LE edema, Lasix was increased. Seen w/ follow up with PCP .  No chest pain, hemoptysis, wt loss, n/v/d, fever .       Review of Systems  neg for any significant sore throat, dysphagia, itching, sneezing, nasal congestion or excess/ purulent secretions, fever, chills, sweats, unintended wt loss, pleuritic or exertional cp, hempoptysis, orthopnea pnd or change in chronic leg swelling. Also denies presyncope,  palpitations, heartburn, abdominal pain, nausea, vomiting, diarrhea or change in bowel or urinary habits, dysuria,hematuria, rash, arthralgias, visual complaints, headache, numbness weakness or ataxia.     Objective:   Physical Exam    Gen. Pleasant, well-nourished, in no distress ENT - no lesions, no post nasal drip Neck: No JVD, no thyromegaly, no carotid bruits Lungs: no use of accessory muscles, no dullness to percussion, decreased BL without rales or rhonchi  Cardiovascular: Rhythm regular, heart sounds  normal, no murmurs or gallops, tr  peripheral edema Musculoskeletal: No deformities, no cyanosis or clubbing         Assessment & Plan:

## 2013-03-13 NOTE — Assessment & Plan Note (Signed)
Improved compensation on Spiriva and pulmonary rehab.   Plan  Continue on Spiriva daily, brush rinse and gargle after use. Continue on oxygen 3 L with walking and At bedtime   Add Stool softner daily  Use Miralax As needed  Constipation .  Continue with pulmonary rehab-great job.  Followup with Dr. Vassie Loll in 3 months and as needed. Please contact office for sooner follow up if symptoms do not improve or worsen or seek emergency care

## 2013-03-13 NOTE — Patient Instructions (Addendum)
Continue on Spiriva daily, brush rinse and gargle after use. Continue on oxygen 3 L with walking and At bedtime   Add Stool softner daily  Use Miralax As needed  Constipation .  Continue with pulmonary rehab-great job.  Followup with Dr. Vassie Loll in 3 months and as needed. Please contact office for sooner follow up if symptoms do not improve or worsen or seek emergency care

## 2013-03-14 ENCOUNTER — Encounter (HOSPITAL_COMMUNITY)
Admission: RE | Admit: 2013-03-14 | Discharge: 2013-03-14 | Disposition: A | Discharge status: Home / Self Care | Payer: Medicare Other | Source: Ambulatory Visit | Attending: Pulmonary Disease | Admitting: Pulmonary Disease

## 2013-03-19 ENCOUNTER — Encounter (HOSPITAL_COMMUNITY)
Admission: RE | Admit: 2013-03-19 | Discharge: 2013-03-19 | Disposition: A | Discharge status: Home / Self Care | Payer: Medicare Other | Source: Ambulatory Visit | Attending: Pulmonary Disease | Admitting: Pulmonary Disease

## 2013-03-19 DIAGNOSIS — J449 Chronic obstructive pulmonary disease, unspecified: Secondary | ICD-10-CM | POA: Insufficient documentation

## 2013-03-19 DIAGNOSIS — Z5189 Encounter for other specified aftercare: Secondary | ICD-10-CM | POA: Insufficient documentation

## 2013-03-19 DIAGNOSIS — J4489 Other specified chronic obstructive pulmonary disease: Secondary | ICD-10-CM | POA: Insufficient documentation

## 2013-03-21 ENCOUNTER — Encounter (HOSPITAL_COMMUNITY)
Admission: RE | Admit: 2013-03-21 | Discharge: 2013-03-21 | Disposition: A | Discharge status: Home / Self Care | Payer: Medicare Other | Source: Ambulatory Visit | Attending: Pulmonary Disease | Admitting: Pulmonary Disease

## 2013-03-26 ENCOUNTER — Encounter (HOSPITAL_COMMUNITY)
Admission: RE | Admit: 2013-03-26 | Discharge: 2013-03-26 | Disposition: A | Discharge status: Home / Self Care | Payer: Medicare Other | Source: Ambulatory Visit | Attending: Pulmonary Disease | Admitting: Pulmonary Disease

## 2013-03-28 ENCOUNTER — Encounter (HOSPITAL_COMMUNITY)
Admission: RE | Admit: 2013-03-28 | Discharge: 2013-03-28 | Disposition: A | Discharge status: Home / Self Care | Payer: Medicare Other | Source: Ambulatory Visit | Attending: Pulmonary Disease | Admitting: Pulmonary Disease

## 2013-04-02 ENCOUNTER — Encounter (HOSPITAL_COMMUNITY)
Admission: RE | Admit: 2013-04-02 | Discharge: 2013-04-02 | Disposition: A | Discharge status: Home / Self Care | Payer: Medicare Other | Source: Ambulatory Visit | Attending: Pulmonary Disease | Admitting: Pulmonary Disease

## 2013-04-04 ENCOUNTER — Encounter (HOSPITAL_COMMUNITY)
Admission: RE | Admit: 2013-04-04 | Discharge: 2013-04-04 | Disposition: A | Discharge status: Home / Self Care | Payer: Medicare Other | Source: Ambulatory Visit | Attending: Pulmonary Disease | Admitting: Pulmonary Disease

## 2013-04-09 ENCOUNTER — Encounter (HOSPITAL_COMMUNITY)
Admission: RE | Admit: 2013-04-09 | Discharge: 2013-04-09 | Disposition: A | Discharge status: Home / Self Care | Payer: Medicare Other | Source: Ambulatory Visit | Attending: Pulmonary Disease | Admitting: Pulmonary Disease

## 2013-04-11 ENCOUNTER — Telehealth: Payer: Self-pay | Admitting: Pulmonary Disease

## 2013-04-11 ENCOUNTER — Encounter (HOSPITAL_COMMUNITY)
Admission: RE | Admit: 2013-04-11 | Discharge: 2013-04-11 | Disposition: A | Discharge status: Home / Self Care | Payer: Medicare Other | Source: Ambulatory Visit | Attending: Pulmonary Disease | Admitting: Pulmonary Disease

## 2013-04-11 DIAGNOSIS — J449 Chronic obstructive pulmonary disease, unspecified: Secondary | ICD-10-CM

## 2013-04-11 MED ORDER — TIOTROPIUM BROMIDE MONOHYDRATE 18 MCG IN CAPS: 18.0000 ug | ORAL_CAPSULE | Freq: Every day | RESPIRATORY_TRACT | Status: DC

## 2013-04-11 NOTE — Telephone Encounter (Signed)
Called, spoke with pt's wife.  Pt is in the donut hole and requesting sample of spiriva. I have placed 2 samples at front for pick up. Wife aware and was very grateful for this. Nothing further needed at this time.

## 2013-04-16 ENCOUNTER — Encounter (HOSPITAL_COMMUNITY)
Admission: RE | Admit: 2013-04-16 | Discharge: 2013-04-16 | Disposition: A | Discharge status: Home / Self Care | Payer: Medicare Other | Source: Ambulatory Visit | Attending: Pulmonary Disease | Admitting: Pulmonary Disease

## 2013-04-18 ENCOUNTER — Encounter (HOSPITAL_COMMUNITY)
Admission: RE | Admit: 2013-04-18 | Discharge: 2013-04-18 | Disposition: A | Discharge status: Home / Self Care | Payer: Medicare Other | Source: Ambulatory Visit | Attending: Pulmonary Disease | Admitting: Pulmonary Disease

## 2013-04-18 DIAGNOSIS — Z5189 Encounter for other specified aftercare: Secondary | ICD-10-CM | POA: Insufficient documentation

## 2013-04-18 DIAGNOSIS — J449 Chronic obstructive pulmonary disease, unspecified: Secondary | ICD-10-CM | POA: Diagnosis not present

## 2013-04-18 DIAGNOSIS — J4489 Other specified chronic obstructive pulmonary disease: Secondary | ICD-10-CM | POA: Insufficient documentation

## 2013-04-23 ENCOUNTER — Encounter (HOSPITAL_COMMUNITY)
Admission: RE | Admit: 2013-04-23 | Discharge: 2013-04-23 | Disposition: A | Discharge status: Home / Self Care | Payer: Medicare Other | Source: Ambulatory Visit | Attending: Pulmonary Disease | Admitting: Pulmonary Disease

## 2013-04-25 ENCOUNTER — Encounter (HOSPITAL_COMMUNITY)
Admission: RE | Admit: 2013-04-25 | Discharge: 2013-04-25 | Disposition: A | Discharge status: Home / Self Care | Payer: Medicare Other | Source: Ambulatory Visit | Attending: Pulmonary Disease | Admitting: Pulmonary Disease

## 2013-04-30 ENCOUNTER — Encounter (HOSPITAL_COMMUNITY)
Admission: RE | Admit: 2013-04-30 | Discharge: 2013-04-30 | Disposition: A | Discharge status: Home / Self Care | Payer: Medicare Other | Source: Ambulatory Visit | Attending: Pulmonary Disease | Admitting: Pulmonary Disease

## 2013-04-30 ENCOUNTER — Telehealth: Payer: Self-pay | Admitting: Pulmonary Disease

## 2013-04-30 DIAGNOSIS — J449 Chronic obstructive pulmonary disease, unspecified: Secondary | ICD-10-CM

## 2013-04-30 MED ORDER — TIOTROPIUM BROMIDE MONOHYDRATE 18 MCG IN CAPS: 18.0000 ug | ORAL_CAPSULE | Freq: Every day | RESPIRATORY_TRACT | Status: DC

## 2013-04-30 NOTE — Telephone Encounter (Signed)
Spoke with patients spouse, Patient is in the donut hole Requesting sample of spiriva Made spouse aware samples would be placed upfront for pickup Nothing further needed at this time

## 2013-05-02 ENCOUNTER — Encounter (HOSPITAL_COMMUNITY)
Admission: RE | Admit: 2013-05-02 | Discharge: 2013-05-02 | Disposition: A | Discharge status: Home / Self Care | Payer: Medicare Other | Source: Ambulatory Visit | Attending: Pulmonary Disease | Admitting: Pulmonary Disease

## 2013-05-07 ENCOUNTER — Encounter (HOSPITAL_COMMUNITY)
Admission: RE | Admit: 2013-05-07 | Discharge: 2013-05-07 | Disposition: A | Discharge status: Home / Self Care | Payer: Medicare Other | Source: Ambulatory Visit | Attending: Pulmonary Disease | Admitting: Pulmonary Disease

## 2013-05-07 DIAGNOSIS — D509 Iron deficiency anemia, unspecified: Secondary | ICD-10-CM | POA: Diagnosis not present

## 2013-05-07 DIAGNOSIS — N182 Chronic kidney disease, stage 2 (mild): Secondary | ICD-10-CM | POA: Diagnosis not present

## 2013-05-09 ENCOUNTER — Encounter (HOSPITAL_COMMUNITY)
Admission: RE | Admit: 2013-05-09 | Discharge: 2013-05-09 | Disposition: A | Discharge status: Home / Self Care | Payer: Medicare Other | Source: Ambulatory Visit | Attending: Pulmonary Disease | Admitting: Pulmonary Disease

## 2013-05-09 DIAGNOSIS — I10 Essential (primary) hypertension: Secondary | ICD-10-CM | POA: Diagnosis not present

## 2013-05-09 DIAGNOSIS — Z125 Encounter for screening for malignant neoplasm of prostate: Secondary | ICD-10-CM | POA: Diagnosis not present

## 2013-05-09 DIAGNOSIS — R350 Frequency of micturition: Secondary | ICD-10-CM | POA: Diagnosis not present

## 2013-05-09 DIAGNOSIS — R339 Retention of urine, unspecified: Secondary | ICD-10-CM | POA: Diagnosis not present

## 2013-05-09 DIAGNOSIS — D509 Iron deficiency anemia, unspecified: Secondary | ICD-10-CM | POA: Diagnosis not present

## 2013-05-09 DIAGNOSIS — R820 Chyluria: Secondary | ICD-10-CM | POA: Diagnosis not present

## 2013-05-09 DIAGNOSIS — N182 Chronic kidney disease, stage 2 (mild): Secondary | ICD-10-CM | POA: Diagnosis not present

## 2013-05-09 DIAGNOSIS — N2581 Secondary hyperparathyroidism of renal origin: Secondary | ICD-10-CM | POA: Diagnosis not present

## 2013-05-14 ENCOUNTER — Encounter (HOSPITAL_COMMUNITY)
Admission: RE | Admit: 2013-05-14 | Discharge: 2013-05-14 | Disposition: A | Discharge status: Home / Self Care | Payer: Medicare Other | Source: Ambulatory Visit | Attending: Pulmonary Disease | Admitting: Pulmonary Disease

## 2013-05-16 ENCOUNTER — Encounter (HOSPITAL_COMMUNITY)
Admission: RE | Admit: 2013-05-16 | Discharge: 2013-05-16 | Disposition: A | Discharge status: Home / Self Care | Payer: Medicare Other | Source: Ambulatory Visit | Attending: Pulmonary Disease | Admitting: Pulmonary Disease

## 2013-05-21 ENCOUNTER — Encounter (HOSPITAL_COMMUNITY)
Admission: RE | Admit: 2013-05-21 | Discharge: 2013-05-21 | Disposition: A | Discharge status: Home / Self Care | Payer: Medicare Other | Source: Ambulatory Visit | Attending: Pulmonary Disease | Admitting: Pulmonary Disease

## 2013-05-21 DIAGNOSIS — J4489 Other specified chronic obstructive pulmonary disease: Secondary | ICD-10-CM | POA: Insufficient documentation

## 2013-05-21 DIAGNOSIS — Z5189 Encounter for other specified aftercare: Secondary | ICD-10-CM | POA: Insufficient documentation

## 2013-05-21 DIAGNOSIS — J449 Chronic obstructive pulmonary disease, unspecified: Secondary | ICD-10-CM | POA: Diagnosis not present

## 2013-05-23 ENCOUNTER — Encounter (HOSPITAL_COMMUNITY)
Admission: RE | Admit: 2013-05-23 | Discharge: 2013-05-23 | Disposition: A | Discharge status: Home / Self Care | Payer: Medicare Other | Source: Ambulatory Visit | Attending: Pulmonary Disease | Admitting: Pulmonary Disease

## 2013-05-23 DIAGNOSIS — J449 Chronic obstructive pulmonary disease, unspecified: Secondary | ICD-10-CM | POA: Diagnosis not present

## 2013-05-23 DIAGNOSIS — Z5189 Encounter for other specified aftercare: Secondary | ICD-10-CM | POA: Diagnosis not present

## 2013-05-28 ENCOUNTER — Encounter (HOSPITAL_COMMUNITY)
Admission: RE | Admit: 2013-05-28 | Discharge: 2013-05-28 | Disposition: A | Discharge status: Home / Self Care | Payer: Medicare Other | Source: Ambulatory Visit | Attending: Pulmonary Disease | Admitting: Pulmonary Disease

## 2013-05-28 DIAGNOSIS — Z5189 Encounter for other specified aftercare: Secondary | ICD-10-CM | POA: Diagnosis not present

## 2013-05-28 DIAGNOSIS — J449 Chronic obstructive pulmonary disease, unspecified: Secondary | ICD-10-CM | POA: Diagnosis not present

## 2013-05-30 ENCOUNTER — Encounter (HOSPITAL_COMMUNITY)
Admission: RE | Admit: 2013-05-30 | Discharge: 2013-05-30 | Disposition: A | Discharge status: Home / Self Care | Payer: Medicare Other | Source: Ambulatory Visit | Attending: Pulmonary Disease | Admitting: Pulmonary Disease

## 2013-05-30 ENCOUNTER — Telehealth: Payer: Self-pay | Admitting: Pulmonary Disease

## 2013-05-30 DIAGNOSIS — J449 Chronic obstructive pulmonary disease, unspecified: Secondary | ICD-10-CM | POA: Diagnosis not present

## 2013-05-30 DIAGNOSIS — Z5189 Encounter for other specified aftercare: Secondary | ICD-10-CM | POA: Diagnosis not present

## 2013-05-30 MED ORDER — TIOTROPIUM BROMIDE MONOHYDRATE 18 MCG IN CAPS: 18.0000 ug | ORAL_CAPSULE | Freq: Every day | RESPIRATORY_TRACT | Status: DC

## 2013-05-30 NOTE — Telephone Encounter (Signed)
Spoke with patients wife(caller) aware that samples are at front for pick up. I also left patient assistance information and copay assistance papers with samples for patient to fill out and send in. Wife is aware if patient does not qualify for patient assistance then they will need to speak with RA at next OV about this.

## 2013-05-30 NOTE — Telephone Encounter (Signed)
Spouse returned call. Kathleen W Perdue °

## 2013-05-30 NOTE — Telephone Encounter (Signed)
LMTCB x 1 

## 2013-06-04 ENCOUNTER — Encounter: Payer: Self-pay | Admitting: Internal Medicine

## 2013-06-04 ENCOUNTER — Encounter (HOSPITAL_COMMUNITY)
Admission: RE | Admit: 2013-06-04 | Discharge: 2013-06-04 | Disposition: A | Discharge status: Home / Self Care | Payer: Medicare Other | Source: Ambulatory Visit | Attending: Pulmonary Disease | Admitting: Pulmonary Disease

## 2013-06-04 DIAGNOSIS — J449 Chronic obstructive pulmonary disease, unspecified: Secondary | ICD-10-CM | POA: Diagnosis not present

## 2013-06-04 DIAGNOSIS — Z5189 Encounter for other specified aftercare: Secondary | ICD-10-CM | POA: Diagnosis not present

## 2013-06-06 ENCOUNTER — Encounter (HOSPITAL_COMMUNITY)
Admission: RE | Admit: 2013-06-06 | Discharge: 2013-06-06 | Disposition: A | Discharge status: Home / Self Care | Payer: Medicare Other | Source: Ambulatory Visit | Attending: Pulmonary Disease | Admitting: Pulmonary Disease

## 2013-06-06 DIAGNOSIS — J449 Chronic obstructive pulmonary disease, unspecified: Secondary | ICD-10-CM | POA: Diagnosis not present

## 2013-06-06 DIAGNOSIS — Z5189 Encounter for other specified aftercare: Secondary | ICD-10-CM | POA: Diagnosis not present

## 2013-06-11 ENCOUNTER — Encounter (HOSPITAL_COMMUNITY)
Admission: RE | Admit: 2013-06-11 | Discharge: 2013-06-11 | Disposition: A | Discharge status: Home / Self Care | Payer: Medicare Other | Source: Ambulatory Visit | Attending: Pulmonary Disease | Admitting: Pulmonary Disease

## 2013-06-11 DIAGNOSIS — Z5189 Encounter for other specified aftercare: Secondary | ICD-10-CM | POA: Diagnosis not present

## 2013-06-11 DIAGNOSIS — J449 Chronic obstructive pulmonary disease, unspecified: Secondary | ICD-10-CM | POA: Diagnosis not present

## 2013-06-13 ENCOUNTER — Encounter (HOSPITAL_COMMUNITY)
Admission: RE | Admit: 2013-06-13 | Discharge: 2013-06-13 | Disposition: A | Discharge status: Home / Self Care | Payer: Medicare Other | Source: Ambulatory Visit | Attending: Pulmonary Disease | Admitting: Pulmonary Disease

## 2013-06-13 DIAGNOSIS — Z5189 Encounter for other specified aftercare: Secondary | ICD-10-CM | POA: Diagnosis not present

## 2013-06-13 DIAGNOSIS — J449 Chronic obstructive pulmonary disease, unspecified: Secondary | ICD-10-CM | POA: Diagnosis not present

## 2013-06-18 ENCOUNTER — Encounter (HOSPITAL_COMMUNITY): Payer: Medicare Other

## 2013-06-20 ENCOUNTER — Encounter (HOSPITAL_COMMUNITY)
Admission: RE | Admit: 2013-06-20 | Discharge: 2013-06-20 | Disposition: A | Discharge status: Home / Self Care | Payer: Medicare Other | Source: Ambulatory Visit | Attending: Pulmonary Disease | Admitting: Pulmonary Disease

## 2013-06-20 DIAGNOSIS — J449 Chronic obstructive pulmonary disease, unspecified: Secondary | ICD-10-CM | POA: Diagnosis not present

## 2013-06-20 DIAGNOSIS — Z5189 Encounter for other specified aftercare: Secondary | ICD-10-CM | POA: Insufficient documentation

## 2013-06-20 DIAGNOSIS — J4489 Other specified chronic obstructive pulmonary disease: Secondary | ICD-10-CM | POA: Insufficient documentation

## 2013-06-25 ENCOUNTER — Encounter (HOSPITAL_COMMUNITY)
Admission: RE | Admit: 2013-06-25 | Discharge: 2013-06-25 | Disposition: A | Discharge status: Home / Self Care | Payer: Medicare Other | Source: Ambulatory Visit | Attending: Pulmonary Disease | Admitting: Pulmonary Disease

## 2013-06-25 DIAGNOSIS — J449 Chronic obstructive pulmonary disease, unspecified: Secondary | ICD-10-CM | POA: Diagnosis not present

## 2013-06-25 DIAGNOSIS — Z5189 Encounter for other specified aftercare: Secondary | ICD-10-CM | POA: Diagnosis not present

## 2013-06-27 ENCOUNTER — Encounter (HOSPITAL_COMMUNITY)
Admission: RE | Admit: 2013-06-27 | Discharge: 2013-06-27 | Disposition: A | Discharge status: Home / Self Care | Payer: Medicare Other | Source: Ambulatory Visit | Attending: Pulmonary Disease | Admitting: Pulmonary Disease

## 2013-06-27 DIAGNOSIS — J449 Chronic obstructive pulmonary disease, unspecified: Secondary | ICD-10-CM | POA: Diagnosis not present

## 2013-06-27 DIAGNOSIS — Z5189 Encounter for other specified aftercare: Secondary | ICD-10-CM | POA: Diagnosis not present

## 2013-07-05 ENCOUNTER — Encounter: Payer: Self-pay | Admitting: Pulmonary Disease

## 2013-07-05 ENCOUNTER — Ambulatory Visit (INDEPENDENT_AMBULATORY_CARE_PROVIDER_SITE_OTHER): Payer: Medicare Other | Admitting: Pulmonary Disease

## 2013-07-05 VITALS — BP 130/72 | HR 81 | Temp 96.8°F | Ht 69.0 in | Wt 142.8 lb

## 2013-07-05 DIAGNOSIS — Z23 Encounter for immunization: Secondary | ICD-10-CM | POA: Diagnosis not present

## 2013-07-05 DIAGNOSIS — J449 Chronic obstructive pulmonary disease, unspecified: Secondary | ICD-10-CM

## 2013-07-05 NOTE — Patient Instructions (Addendum)
STOP albuterol tabs Use albuterol MDI 2 puffs as needed for shortness of breath Flu shot Stay on maintenance exercise program

## 2013-07-05 NOTE — Assessment & Plan Note (Addendum)
STOP albuterol tabs Use albuterol MDI 2 puffs as needed for shortness of breath Ct symbicort/ spiriva Flu shot Stay on maintenance exercise program

## 2013-07-05 NOTE — Progress Notes (Signed)
  Subjective:    Patient ID: [name], male    DOB: [birth year], 77 y.o.   MRN: [id number]  HPI  PMD - [name]   77/M, ex- smoker- severe COPD on nocturnal O2 since 5/11 & cough.  5/10 PFTs >> severe obstruction, FEV1 39%, no BD response  He developed a chronic cough.since his heart surgery by [name] in 6/09  Barrett's esophagus 3 cm noted to be stable by [name].  H/o sinus surgery, tonsillectomy  CT chest 4/09 severe emphysema, scattered sub-cm nodules stable since 2005  Hosp adm in 2/12 >> for cough syncope - admitted to tele but transferred to 2900 for another syncopal event  CT chest 11/16/10 neg PE, stable nodules, small RLL infx Rxed for HCAP with cipro & zosyn  Modified Ba swallow nml  Ct angio 2/12 did not show any suspicious nodules  11/04/2011 Started on Daliresp by Primary MD - stopped 4/13, makes him 'mean' , has lost wt ,  4/13 ono on RA showed only 2 min desatn < 88% -has been off O2   11/27/12 AECOPD/CAP admission >CXR 2/14 - clear    07/05/2013 110m FU  Pt reports his breathing has been doing good. He graduated from [facility] rehab [facility] Takes albuterol tabs - not used MDI in a while - expires nov 2014 Pt does cough up clear-yellow tint phlem. Denies any wheezing and no chest tx Uses O2 at night, doe snot need much in daytime Last ER visit 12/2012 for LE edema, Lasix was increased  Review of Systems neg for any significant sore throat, dysphagia, itching, sneezing, nasal congestion or excess/ purulent secretions, fever, chills, sweats, unintended wt loss, pleuritic or exertional cp, hempoptysis, orthopnea pnd or change in chronic leg swelling. Also denies presyncope, palpitations, heartburn, abdominal pain, nausea, vomiting, diarrhea or change in bowel or urinary habits, dysuria,hematuria, rash, arthralgias, visual complaints, headache, numbness weakness or ataxia.     Objective:   Physical Exam  Gen. Pleasant, well-nourished, in no  distress ENT - no lesions, no post nasal drip Neck: No JVD, no thyromegaly, no carotid bruits Lungs: no use of accessory muscles, no dullness to percussion, clear without rales or rhonchi  Cardiovascular: Rhythm regular, heart sounds  normal, no murmurs or gallops, no peripheral edema Musculoskeletal: No deformities, no cyanosis or clubbing        Assessment & Plan:

## 2013-07-09 NOTE — Progress Notes (Signed)
Pulmonary Rehabilitation Program Outcomes Report   Orientation:  02/06/2013 Halfway report: 04/23/13 Graduate Date:  tbd Discharge Date:  tbd # of sessions completed: 18  Pulmonologist: Cyril Mourning Family MD:  Dr. Elfredia Nevins Class Time:  13:00  A.  Exercise Program:  Tolerates exercise @ 3.70 METS for 15 minutes  B.  Mental Health:  Good mental attitude  C.  Education/Instruction/Skills  Accurately checks own pulse.  Rest:  84  Exercise:  102, Knows THR for exercise and Uses Perceived Exertion Scale and/or Dyspnea Scale  Demonstrates accurate pursed lip breathing  D.  Nutrition/Weight Control/Body Composition:  Adherence to prescribed nutrition program: good    E.  Blood Lipids    Lab Results  Component Value Date   CHOL 155 03/03/2012   HDL 69 03/03/2012   LDLCALC 70 03/03/2012   TRIG 78 03/03/2012   CHOLHDL 2.2 03/03/2012    F.  Lifestyle Changes:  Making positive lifestyle changes and Not smoking:  Quit 1999  G.  Symptoms noted with exercise:  Asymptomatic  Report Completed By:  Lelon Huh. Aberdeen Hafen RN   Comments:  This is patient's Halfway report. He achieved a peak METS of 3.70. His resting HR was 84 and BP was 112/60. Peak HR was 102 and BP was 142/62. He has done well while in rehab.

## 2013-07-09 NOTE — Progress Notes (Signed)
Pulmonary Rehabilitation Program Outcomes Report   Orientation:  02/06/2013 1st week Report : 02/26/2013 Graduate Date:  tbd Discharge Date:  tbd # of sessions completed: 3 [id number]  Pulmonologist: Cyril Mourning Family MD:  Elfredia Nevins Class Time:  13:00  A.  Exercise Program:  Tolerates exercise @ 3.70 METS for 15 minutes, Walk Test Results:  Pre: Pre walk Test: Resting HR 73, BP 110/60, O2 98%, RPE 8, and RPD 8. ^min HR 84, BP 138/70, O2 91%,, RPE 13 and RPD 13, Post HR 71, BP 118/60, O2 97%,,RPE 8 and RPD8. Walked 900 ft at 1. with METS of 2.3. and No Change functional capacity  2.3 %  B.  Mental Health:  Good mental attitude and Quality of Life (QOL)  improvements:  Overall  20.81 %, Health/Functioning 20.67 %, Socioeconomics 21.00 %, Psych/Spiritual 21.00 %, Family 21.00 %    C.  Education/Instruction/Skills  Accurately checks own pulse.  Rest:  76  Exercise:  97, Knows THR for exercise and Uses Perceived Exertion Scale and/or Dyspnea Scale  Demonstrates accurate pursed lip breathing  D.  Nutrition/Weight Control/Body Composition:  Adherence to prescribed nutrition program: good    E.  Blood Lipids    Lab Results  Component Value Date   CHOL 155 03/03/2012   HDL 69 03/03/2012   LDLCALC 70 03/03/2012   TRIG 78 03/03/2012   CHOLHDL 2.2 03/03/2012    F.  Lifestyle Changes:  Making positive lifestyle changes and Not smoking:  Quit 1999  G.  Symptoms noted with exercise:  Asymptomatic  Report Completed By:  Lelon Huh. Breyanna Valera RN   Comments:  This is patients 1st week Report . He achieved a peak Mets of 3.70. His resting HR was 76 and his resting BP was 110/70. His peak HR was 97 and peak BP was 142/70. A halfway report will follow upon his 18th Visit.

## 2013-07-09 NOTE — Addendum Note (Signed)
Encounter addended by: Angelica Pou, RN on: 07/09/2013  7:53 AM<BR>     Documentation filed: Notes Section

## 2013-07-09 NOTE — Addendum Note (Signed)
Encounter addended by: Angelica Pou, RN on: 07/09/2013  7:54 AM<BR>     Documentation filed: Clinical Notes

## 2013-07-09 NOTE — Addendum Note (Signed)
Encounter addended by: Angelica Pou, RN on: 07/09/2013  7:39 AM<BR>     Documentation filed: Clinical Notes

## 2013-07-09 NOTE — Addendum Note (Signed)
Encounter addended by: Angelica Pou, RN on: 07/09/2013  7:38 AM<BR>     Documentation filed: Notes Section

## 2013-07-10 ENCOUNTER — Encounter: Payer: Self-pay | Admitting: Cardiovascular Disease

## 2013-07-13 NOTE — Progress Notes (Signed)
Patient graduated from Pulmonary Rehabilitation today on 06/27/13 after completing 36 sessions. He achieved LTG of 30 minutes of aerobic exercise at Max Met level of 3.69. All patients vitals are WNL. Patient has met with dietician. Discharge instruction has been reviewed in detail and patient stated an understanding of material given. Patient plans to exercise at [facility] on Treadmill and Nustep. Cardiac Rehab staff will make f/u calls at 1 month, 6 months, and 1 year. Patient had no complaints of any abnormal S/S or pain on their exit visit. Exit walk test was done on TM.

## 2013-07-23 NOTE — Addendum Note (Signed)
Encounter addended by: Angelica Pou, RN on: 07/23/2013  7:59 AM<BR>     Documentation filed: Clinical Notes

## 2013-07-23 NOTE — Progress Notes (Signed)
Pulmonary Rehabilitation Program Outcomes Report   Orientation:  02/06/2013 Graduate Date:  06/27/2013 Discharge Date:  06/27/2013 # of sessions completed: 36 DX: COPD  Pulmonologist: Dr. Vassie Loll Family MD:  Dr. Sigmund Hazel Time:  13:00  A.  Exercise Program:  Tolerates exercise @ 3.69 METS for 15 minutes, Walk Test Results:  Post: Graduation Walk Test: Resting HR 71, BP120/70, O2 98%, RPE 8 and RPD 8. 6 min HR 85, BP 152/68, O 2 92%, RPE 12 and RPD 12, and Post HR 60, BP 118/60, O2 99%,  RPE 8 and RPD 8. Walked 900 ft at 1.7 mph at METS of 2.3. and Discharged to home exercise program.  Anticipated compliance:  excellent  B.  Mental Health:  Good mental attitude and Quality of Life (QOL)  changes:  Overall  26.18 %, Health/Functioning 22.80 %, Socioeconomics 29.00 %, Psych/Spiritual 29.14 %, Family 28.80 %    C.  Education/Instruction/Skills  Accurately checks own pulse.  Rest:  71  Exercise:  92, Knows THR for exercise, Uses Perceived Exertion Scale and/or Dyspnea Scale and Attended 14 education classes  Anticipated home compliance with respiratory program:  excellent  D.  Nutrition/Weight Control/Body Composition:  Adherence to prescribed nutrition program: good , Patient has gained 0.2 kg and Refused Appointment   E.  Blood Lipids    Lab Results  Component Value Date   CHOL 155 03/03/2012   HDL 69 03/03/2012   LDLCALC 70 03/03/2012   TRIG 78 03/03/2012   CHOLHDL 2.2 03/03/2012    F.  Lifestyle Changes:  Making positive lifestyle changes and Not smoking:  Quit 1999  G.  Symptoms noted with exercise:  Asymptomatic  Report Completed By:  Lelon Huh. Nelda Luckey RN   Comments:  This is patients graduation Report. He achieved a Peak METS of 3.69. He has done well and Plans to continue his exercise at the local [facility]. His resting HR was 71 and BP 120/70. Peak HR was 92 and BP was 152/68.  He has done well.

## 2013-07-23 NOTE — Addendum Note (Signed)
Encounter addended by: Angelica Pou, RN on: 07/23/2013  7:58 AM<BR>     Documentation filed: Notes Section

## 2013-08-07 ENCOUNTER — Telehealth: Payer: Self-pay | Admitting: Pulmonary Disease

## 2013-08-07 NOTE — Telephone Encounter (Signed)
Samples of the spiriva have been left up front for the pt to come by and pick these up.  i have called the pts home number and lmomtcb to make him aware.

## 2013-08-07 NOTE — Telephone Encounter (Signed)
Patient calling about the same--[phone]

## 2013-08-08 NOTE — Telephone Encounter (Signed)
Pt returned call & is aware that samples have been left up front for him to p/u.  Pt states nothing further needed at this time.   [name]

## 2013-08-23 ENCOUNTER — Telehealth: Payer: Self-pay | Admitting: Pulmonary Disease

## 2013-08-23 DIAGNOSIS — J449 Chronic obstructive pulmonary disease, unspecified: Secondary | ICD-10-CM

## 2013-08-23 MED ORDER — TIOTROPIUM BROMIDE MONOHYDRATE 18 MCG IN CAPS: 18.0000 ug | ORAL_CAPSULE | Freq: Every day | RESPIRATORY_TRACT | Status: DC

## 2013-08-23 NOTE — Telephone Encounter (Signed)
PT advised that samples of Spiriva were left at front desk.

## 2013-09-10 ENCOUNTER — Ambulatory Visit: Payer: Medicare Other | Admitting: Cardiovascular Disease

## 2013-09-11 ENCOUNTER — Telehealth: Payer: Self-pay | Admitting: Pulmonary Disease

## 2013-09-11 DIAGNOSIS — J4489 Other specified chronic obstructive pulmonary disease: Secondary | ICD-10-CM

## 2013-09-11 DIAGNOSIS — J449 Chronic obstructive pulmonary disease, unspecified: Secondary | ICD-10-CM

## 2013-09-11 MED ORDER — TIOTROPIUM BROMIDE MONOHYDRATE 18 MCG IN CAPS: 18.0000 ug | ORAL_CAPSULE | Freq: Every day | RESPIRATORY_TRACT | Status: DC

## 2013-09-11 NOTE — Telephone Encounter (Signed)
Pt aware samples left for pick up. Nothing further needed 

## 2013-09-19 ENCOUNTER — Ambulatory Visit: Payer: Medicare Other | Admitting: Cardiovascular Disease

## 2013-10-02 ENCOUNTER — Encounter: Payer: Self-pay | Admitting: Adult Health

## 2013-10-02 ENCOUNTER — Ambulatory Visit (INDEPENDENT_AMBULATORY_CARE_PROVIDER_SITE_OTHER): Payer: Medicare Other | Admitting: Adult Health

## 2013-10-02 VITALS — BP 124/62 | HR 92 | Temp 98.0°F | Ht 69.0 in | Wt 146.6 lb

## 2013-10-02 DIAGNOSIS — J441 Chronic obstructive pulmonary disease with (acute) exacerbation: Secondary | ICD-10-CM | POA: Diagnosis not present

## 2013-10-02 MED ORDER — LEVALBUTEROL HCL 0.63 MG/3ML IN NEBU
0.6300 mg | INHALATION_SOLUTION | Freq: Once | RESPIRATORY_TRACT | Status: AC
  Administered 2013-10-02: 0.63 mg via RESPIRATORY_TRACT

## 2013-10-02 MED ORDER — AZITHROMYCIN 250 MG PO TABS: ORAL_TABLET | ORAL | Status: AC

## 2013-10-02 MED ORDER — PREDNISONE 10 MG PO TABS: ORAL_TABLET | ORAL | Status: DC

## 2013-10-02 NOTE — Progress Notes (Signed)
  Subjective:    Patient ID: [name], male    DOB: 1936-09-02, 77 y.o.   MRN: [id number]  HPI PMD - [name]   77/M, ex- smoker- severe COPD on nocturnal O2 since 5/11 & cough.  5/10 PFTs >> severe obstruction, FEV1 39%, no BD response  He developed a chronic cough.since his heart surgery by [name] in 6/09  Barrett's esophagus 3 cm noted to be stable by [name].  H/o sinus surgery, tonsillectomy  CT chest 4/09 severe emphysema, scattered sub-cm nodules stable since 2005  Hosp adm in 2/12 >> for cough syncope - admitted to tele but transferred to 2900 for another syncopal event  CT chest 11/16/10 neg PE, stable nodules, small RLL infx Rxed for HCAP with cipro & zosyn  Modified Ba swallow nml  Ct angio 2/12 did not show any suspicious nodules  11/04/2011 Started on Daliresp by Primary MD - stopped 4/13, makes him 'mean' , has lost wt ,  4/13 ono on RA showed only 2 min desatn < 88% -has been off O2   11/27/12 AECOPD/CAP admission >CXR 2/14 - clear    07/05/13  60m FU  Pt reports his breathing has been doing good. He graduated from [facility] Takes albuterol tabs - not used MDI in a while - expires nov 2014 Pt does cough up clear-yellow tint phlem. Denies any wheezing and no chest tx Uses O2 at night, doe snot need much in daytime Last ER visit 12/2012 for LE edema, Lasix was increased  10/02/2013 Acute OV  Complains of  sore throat, prod cough w/ white color phlem, wheezing, chest congestion, HAa, facial pressure x last night.   denies any PND, no nasal congestion. Patient says that he has been under a lot of stress lately. His wife has been diagnosed with stage IV cancer. She's been in and out of the hospital.  He denies any hemoptysis, orthopnea, PND, leg swelling. Patient states that over the last week. Congestion. Has worsenedWith worsening sinus congestion, and wheezing. Last night. He denies any recent travel or antibiotic use. He's had no  emergency room visits or hospitalizations. Over the last 6 months.  Review of Systems  neg for any significant sore throat, dysphagia, itching, sneezing,  fever, chills, sweats, unintended wt loss, pleuritic or exertional cp, hempoptysis, orthopnea pnd or change in chronic leg swelling. Also denies presyncope, palpitations, heartburn, abdominal pain, nausea, vomiting, diarrhea or change in bowel or urinary habits, dysuria,hematuria, rash, arthralgias, visual complaints, headache, numbness weakness or ataxia.     Objective:   Physical Exam   Gen. Pleasant, elderly , in no distress ENT - no lesions, no post nasal drip Neck: No JVD, no thyromegaly, no carotid bruits Lungs: no use of accessory muscles, no dullness to percussion, faint exp wheeze  Cardiovascular: Rhythm regular, heart sounds  normal, no murmurs or gallops, no peripheral edema Musculoskeletal: No deformities, no cyanosis or clubbing        Assessment & Plan:

## 2013-10-02 NOTE — Assessment & Plan Note (Signed)
Flare   Plan  Mucinex DM Twice daily  As needed  Cough/congesiton  Fluids and rest  Prednisone taper over next week.  Zpack take as directed.  Please contact office for sooner follow up if symptoms do not improve or worsen or seek emergency care  follow up Dr. Vassie Loll  As planned and As needed

## 2013-10-02 NOTE — Patient Instructions (Addendum)
Mucinex DM Twice daily  As needed  Cough/congesiton  Fluids and rest  Prednisone taper over next week.  Zpack take as directed.  Please contact office for sooner follow up if symptoms do not improve or worsen or seek emergency care  follow up Dr. Vassie Loll  As planned and As needed

## 2013-10-12 ENCOUNTER — Telehealth: Payer: Self-pay | Admitting: Pulmonary Disease

## 2013-10-12 NOTE — Telephone Encounter (Signed)
Ok to use otc decongestant Sudafed-PE lightly, maybe just once each morning for a few days. This will help open his head, but hopefully not over-stimulate heart.

## 2013-10-12 NOTE — Telephone Encounter (Signed)
Called, spoke with pt and pt's wife.  Informed them both of below recs per CDY.  They verbalized understanding and is to call back if symptoms do not improve or worsen.

## 2013-10-12 NOTE — Telephone Encounter (Signed)
Called and spoke with pt and he stated that we was seen by TP on 12/16 and given zpak and pred taper.  He is still having lots of head congestion.  He stated that he never really got over being sick even with the zpak and pred taper.  This congestion has not gone to his chest yet.  Denies any fever or body aches.  Pt is aware the RA is out of the office today.  CY please advise. Thanks  Next ov with TP--11/07/2013 Last ov with RA--07/05/2013  No Known Allergies   Current Outpatient Prescriptions on File Prior to Visit  Medication Sig Dispense Refill  . acetaminophen (TYLENOL) 500 MG tablet Take 500 mg by mouth every 6 (six) hours as needed for pain.      Marland Kitchen albuterol (PROVENTIL HFA;VENTOLIN HFA) 108 (90 BASE) MCG/ACT inhaler Inhale 2 puffs into the lungs every 6 (six) hours as needed for wheezing or shortness of breath.      . ALPRAZolam (XANAX) 0.25 MG tablet Take 1 tablet (0.25 mg total) by mouth at bedtime as needed for sleep.  30 tablet  0  . amLODipine (NORVASC) 10 MG tablet Take 5 mg by mouth daily.       Marland Kitchen aspirin 81 MG tablet Take 81 mg by mouth daily.      Marland Kitchen docusate sodium (COLACE) 100 MG capsule Take 200 mg by mouth at bedtime as needed for constipation.       . furosemide (LASIX) 20 MG tablet Take 1 tablet by mouth Once daily as needed.      Marland Kitchen HYDROcodone-acetaminophen (NORCO/VICODIN) 5-325 MG per tablet Take 1 tablet by mouth every 6 (six) hours as needed for pain.      . hydroxypropyl methylcellulose (ISOPTO TEARS) 2.5 % ophthalmic solution Place 1 drop into both eyes daily as needed. For redness      . Omega-3 Fatty Acids (EQL FISH OIL) 1000 MG CAPS Take 2 capsules (2,000 mg total) by mouth daily.  60 capsule  3  . omeprazole (PRILOSEC) 20 MG capsule Take 20 mg by mouth daily. 30 minutes before breakfast      . pravastatin (PRAVACHOL) 80 MG tablet Take 80 mg by mouth at bedtime.      . predniSONE (DELTASONE) 10 MG tablet 4 tabs for 2 days, then 3 tabs for 2 days, 2 tabs for 2 days,  then 1 tab for 2 days, then stop  20 tablet  0  . tiotropium (SPIRIVA) 18 MCG inhalation capsule Place 1 capsule (18 mcg total) into inhaler and inhale daily.  30 capsule  0   No current facility-administered medications on file prior to visit.

## 2013-10-19 ENCOUNTER — Telehealth: Payer: Self-pay | Admitting: Pulmonary Disease

## 2013-10-19 MED ORDER — AZITHROMYCIN 250 MG PO TABS: ORAL_TABLET | ORAL | Status: DC

## 2013-10-19 NOTE — Telephone Encounter (Signed)
Called and spoke with pt and he is aware of RA recs.  zpak has been sent to his pharmacy and pt is aware. Nothing further is needed.

## 2013-10-19 NOTE — Telephone Encounter (Signed)
Called and spoke with pt and he stated that he and his wife have been sick.  He has been having nasal congestion and headache x 1 week.  He has been using the tylenol and sudafed PE this morning.  Pt was seen by TP on 10/02/2013 and given pred taper.  Pt still c/o of not being better.  Requesting that something be called in for him.  RA please advise. Thanks  No Known Allergies   Current Outpatient Prescriptions on File Prior to Visit  Medication Sig Dispense Refill  . acetaminophen (TYLENOL) 500 MG tablet Take 500 mg by mouth every 6 (six) hours as needed for pain.      Marland Kitchen. albuterol (PROVENTIL HFA;VENTOLIN HFA) 108 (90 BASE) MCG/ACT inhaler Inhale 2 puffs into the lungs every 6 (six) hours as needed for wheezing or shortness of breath.      . ALPRAZolam (XANAX) 0.25 MG tablet Take 1 tablet (0.25 mg total) by mouth at bedtime as needed for sleep.  30 tablet  0  . amLODipine (NORVASC) 10 MG tablet Take 5 mg by mouth daily.       Marland Kitchen. aspirin 81 MG tablet Take 81 mg by mouth daily.      Marland Kitchen. docusate sodium (COLACE) 100 MG capsule Take 200 mg by mouth at bedtime as needed for constipation.       . furosemide (LASIX) 20 MG tablet Take 1 tablet by mouth Once daily as needed.      Marland Kitchen. HYDROcodone-acetaminophen (NORCO/VICODIN) 5-325 MG per tablet Take 1 tablet by mouth every 6 (six) hours as needed for pain.      . hydroxypropyl methylcellulose (ISOPTO TEARS) 2.5 % ophthalmic solution Place 1 drop into both eyes daily as needed. For redness      . Omega-3 Fatty Acids (EQL FISH OIL) 1000 MG CAPS Take 2 capsules (2,000 mg total) by mouth daily.  60 capsule  3  . omeprazole (PRILOSEC) 20 MG capsule Take 20 mg by mouth daily. 30 minutes before breakfast      . pravastatin (PRAVACHOL) 80 MG tablet Take 80 mg by mouth at bedtime.      . predniSONE (DELTASONE) 10 MG tablet 4 tabs for 2 days, then 3 tabs for 2 days, 2 tabs for 2 days, then 1 tab for 2 days, then stop  20 tablet  0  . tiotropium (SPIRIVA) 18 MCG  inhalation capsule Place 1 capsule (18 mcg total) into inhaler and inhale daily.  30 capsule  0   No current facility-administered medications on file prior to visit.

## 2013-10-19 NOTE — Telephone Encounter (Signed)
Pt's wife calling again in ref to previous msg can be reached at [phone].Joseph EvertsJuanita S [name]

## 2013-10-19 NOTE — Telephone Encounter (Signed)
zpak OV next week if no better

## 2013-10-22 ENCOUNTER — Telehealth: Payer: Self-pay | Admitting: Pulmonary Disease

## 2013-10-22 MED ORDER — PREDNISONE 10 MG PO TABS: ORAL_TABLET | ORAL | Status: DC

## 2013-10-22 NOTE — Telephone Encounter (Signed)
Spouse aware of recs. Nothing further needed

## 2013-10-22 NOTE — Telephone Encounter (Signed)
Called and spoke with pts wife and she stated that the pt was given zpak on 10/19/13 and finished this this morning.  She stated that he is really not any better.  Still cough with white sputum, sore throat, runny nose.  They are requesting that something else be called to the pharmacy.  Please advise. Thanks  No Known Allergies   Current Outpatient Prescriptions on File Prior to Visit  Medication Sig Dispense Refill  . acetaminophen (TYLENOL) 500 MG tablet Take 500 mg by mouth every 6 (six) hours as needed for pain.      Marland Kitchen. albuterol (PROVENTIL HFA;VENTOLIN HFA) 108 (90 BASE) MCG/ACT inhaler Inhale 2 puffs into the lungs every 6 (six) hours as needed for wheezing or shortness of breath.      . ALPRAZolam (XANAX) 0.25 MG tablet Take 1 tablet (0.25 mg total) by mouth at bedtime as needed for sleep.  30 tablet  0  . amLODipine (NORVASC) 10 MG tablet Take 5 mg by mouth daily.       Marland Kitchen. aspirin 81 MG tablet Take 81 mg by mouth daily.      Marland Kitchen. azithromycin (ZITHROMAX) 250 MG tablet Take as directed  6 tablet  0  . docusate sodium (COLACE) 100 MG capsule Take 200 mg by mouth at bedtime as needed for constipation.       . furosemide (LASIX) 20 MG tablet Take 1 tablet by mouth Once daily as needed.      Marland Kitchen. HYDROcodone-acetaminophen (NORCO/VICODIN) 5-325 MG per tablet Take 1 tablet by mouth every 6 (six) hours as needed for pain.      . hydroxypropyl methylcellulose (ISOPTO TEARS) 2.5 % ophthalmic solution Place 1 drop into both eyes daily as needed. For redness      . Omega-3 Fatty Acids (EQL FISH OIL) 1000 MG CAPS Take 2 capsules (2,000 mg total) by mouth daily.  60 capsule  3  . omeprazole (PRILOSEC) 20 MG capsule Take 20 mg by mouth daily. 30 minutes before breakfast      . pravastatin (PRAVACHOL) 80 MG tablet Take 80 mg by mouth at bedtime.      . predniSONE (DELTASONE) 10 MG tablet 4 tabs for 2 days, then 3 tabs for 2 days, 2 tabs for 2 days, then 1 tab for 2 days, then stop  20 tablet  0  . tiotropium  (SPIRIVA) 18 MCG inhalation capsule Place 1 capsule (18 mcg total) into inhaler and inhale daily.  30 capsule  0   No current facility-administered medications on file prior to visit.

## 2013-10-22 NOTE — Telephone Encounter (Signed)
Does not need more abx since phlegm is clear Prednisone 10 mg tabs  Take 2 tabs daily with food x 5ds, then 1 tab daily with food x 5ds then STOP

## 2013-10-22 NOTE — Telephone Encounter (Signed)
Pt's wife calling again in ref to previous msg can be reached at [phone].[name] ° °

## 2013-10-23 DIAGNOSIS — E871 Hypo-osmolality and hyponatremia: Secondary | ICD-10-CM | POA: Diagnosis not present

## 2013-10-26 ENCOUNTER — Ambulatory Visit: Payer: Medicare Other | Admitting: Cardiovascular Disease

## 2013-11-01 DIAGNOSIS — IMO0002 Reserved for concepts with insufficient information to code with codable children: Secondary | ICD-10-CM | POA: Diagnosis not present

## 2013-11-01 DIAGNOSIS — K219 Gastro-esophageal reflux disease without esophagitis: Secondary | ICD-10-CM | POA: Diagnosis not present

## 2013-11-01 DIAGNOSIS — J441 Chronic obstructive pulmonary disease with (acute) exacerbation: Secondary | ICD-10-CM | POA: Diagnosis not present

## 2013-11-01 DIAGNOSIS — J018 Other acute sinusitis: Secondary | ICD-10-CM | POA: Diagnosis not present

## 2013-11-01 DIAGNOSIS — J209 Acute bronchitis, unspecified: Secondary | ICD-10-CM | POA: Diagnosis not present

## 2013-11-07 ENCOUNTER — Ambulatory Visit (INDEPENDENT_AMBULATORY_CARE_PROVIDER_SITE_OTHER)
Admission: RE | Admit: 2013-11-07 | Discharge: 2013-11-07 | Disposition: A | Discharge status: Home / Self Care | Payer: Medicare Other | Source: Ambulatory Visit | Attending: Adult Health | Admitting: Adult Health

## 2013-11-07 ENCOUNTER — Encounter: Payer: Self-pay | Admitting: Adult Health

## 2013-11-07 ENCOUNTER — Ambulatory Visit (INDEPENDENT_AMBULATORY_CARE_PROVIDER_SITE_OTHER): Payer: Medicare Other | Admitting: Adult Health

## 2013-11-07 VITALS — BP 116/76 | HR 75 | Temp 97.6°F | Ht 69.0 in | Wt 139.8 lb

## 2013-11-07 DIAGNOSIS — J441 Chronic obstructive pulmonary disease with (acute) exacerbation: Secondary | ICD-10-CM

## 2013-11-07 DIAGNOSIS — J449 Chronic obstructive pulmonary disease, unspecified: Secondary | ICD-10-CM | POA: Diagnosis not present

## 2013-11-07 DIAGNOSIS — J4489 Other specified chronic obstructive pulmonary disease: Secondary | ICD-10-CM

## 2013-11-07 NOTE — Patient Instructions (Signed)
Add Breo 1 puff daily  Continue on Spiriva daily, brush rinse and gargle after use. Check chest xray today .  Followup with Dr. Vassie LollAlva in 1 months and as needed. Please contact office for sooner follow up if symptoms do not improve or worsen or seek emergency care

## 2013-11-07 NOTE — Progress Notes (Signed)
  Subjective:    Patient ID: [name], male    DOB: 06/21/1936, 78 y.o.   MRN: [id number]  HPI PMD - [name]   77/M, ex- smoker- severe COPD on nocturnal O2 since 5/11 & cough.  5/10 PFTs >> severe obstruction, FEV1 39%, no BD response  He developed a chronic cough.since his heart surgery by Dr Cornelius Moraswen in 6/09  Barrett's esophagus 3 cm noted to be stable by Dr Jena Gaussourk.  H/o sinus surgery, tonsillectomy  CT chest 4/09 severe emphysema, scattered sub-cm nodules stable since 2005  Hosp adm in 2/12 >> for cough syncope - admitted to tele but transferred to 2900 for another syncopal event  CT chest 11/16/10 neg PE, stable nodules, small RLL infx Rxed for HCAP with cipro & zosyn  Modified Ba swallow nml  Ct angio 2/12 did not show any suspicious nodules  11/04/2011 Started on Daliresp by Primary MD - stopped 4/13, makes him 'mean' , has lost wt ,  4/13 ono on RA showed only 2 min desatn < 88% -has been off O2   11/27/12 AECOPD/CAP admission >CXR 2/14 - clear    07/05/13  8448m FU  Pt reports his breathing has been doing good. He graduated from [facility] Takes albuterol tabs - not used MDI in a while - expires nov 2014 Pt does cough up clear-yellow tint phlem. Denies any wheezing and no chest tx Uses O2 at night, doe snot need much in daytime Last ER visit 12/2012 for LE edema, Lasix was increased  10/02/13  Acute OV  Complains of  sore throat, prod cough w/ white color phlem, wheezing, chest congestion, HAa, facial pressure x last night.   denies any PND, no nasal congestion. Patient says that he has been under a lot of stress lately. His wife has been diagnosed with stage IV cancer. She's been in and out of the hospital.  He denies any hemoptysis, orthopnea, PND, leg swelling. Patient states that over the last week. Congestion. Has worsenedWith worsening sinus congestion, and wheezing. Last night. He denies any recent travel or antibiotic use. He's had no  emergency room visits or hospitalizations. Over the last 6 months. >>zpack /pred   11/07/2013 Follow up  1 month follow up COPD.  Seen by PCP this week, still having prod cough with white mucus, wheezing, increased SOB, chest tightness.  denies f/c/s, nausea, vomiting, edema.  on abx by PCP. Has few days left of doxycycline.  No hemoptysis, chest pain, orthopnea or edema.  Mucus is mainly white.  Previously on advair and daliresp.     Review of Systems  neg for any significant sore throat, dysphagia, itching, sneezing,  fever, chills, sweats, unintended wt loss, pleuritic or exertional cp, hempoptysis, orthopnea pnd or change in chronic leg swelling. Also denies presyncope, palpitations, heartburn, abdominal pain, nausea, vomiting, diarrhea or change in bowel or urinary habits, dysuria,hematuria, rash, arthralgias, visual complaints, headache, numbness weakness or ataxia.     Objective:   Physical Exam   Gen. Pleasant, elderly , in no distress ENT - no lesions, no post nasal drip Neck: No JVD, no thyromegaly, no carotid bruits Lungs: no use of accessory muscles, no dullness to percussion, no wheezing  Cardiovascular: Rhythm regular, heart sounds  normal, no murmurs or gallops, no peripheral edema Musculoskeletal: No deformities, no cyanosis or clubbing        Assessment & Plan:

## 2013-11-07 NOTE — Assessment & Plan Note (Signed)
Frequent flare in severe COPD  Check cxr today  Finish abx per PCP   Plan  Add Breo 1 puff daily  Continue on Spiriva daily, brush rinse and gargle after use. Check chest xray today .  Followup with Dr. Vassie LollAlva in 1 months and as needed. Please contact office for sooner follow up if symptoms do not improve or worsen or seek emergency care

## 2013-11-08 ENCOUNTER — Telehealth: Payer: Self-pay | Admitting: Pulmonary Disease

## 2013-11-08 ENCOUNTER — Encounter: Payer: Self-pay | Admitting: *Deleted

## 2013-11-08 NOTE — Telephone Encounter (Signed)
Notes Recorded by Julio Sicksammy S Parrett, NP on 11/08/2013 at 8:45 AM Chronic changes  No acute process noted ---  I spoke with patient about results and he verbalized understanding and had no questions. Spouse is also aware of results. Nothing further needed

## 2013-11-12 ENCOUNTER — Encounter: Payer: Self-pay | Admitting: Cardiovascular Disease

## 2013-11-12 ENCOUNTER — Ambulatory Visit (INDEPENDENT_AMBULATORY_CARE_PROVIDER_SITE_OTHER): Payer: Medicare Other | Admitting: Cardiovascular Disease

## 2013-11-12 VITALS — BP 143/79 | HR 86 | Ht 68.0 in | Wt 138.0 lb

## 2013-11-12 DIAGNOSIS — E785 Hyperlipidemia, unspecified: Secondary | ICD-10-CM

## 2013-11-12 DIAGNOSIS — I10 Essential (primary) hypertension: Secondary | ICD-10-CM | POA: Diagnosis not present

## 2013-11-12 DIAGNOSIS — J449 Chronic obstructive pulmonary disease, unspecified: Secondary | ICD-10-CM

## 2013-11-12 DIAGNOSIS — I25810 Atherosclerosis of coronary artery bypass graft(s) without angina pectoris: Secondary | ICD-10-CM

## 2013-11-12 DIAGNOSIS — J4489 Other specified chronic obstructive pulmonary disease: Secondary | ICD-10-CM

## 2013-11-12 NOTE — Patient Instructions (Addendum)
Your physician wants you to follow-up in: 6 months You will receive a reminder letter in the mail two months in advance. If you don't receive a letter, please call our office to schedule the follow-up appointment.   Your physician recommends that you continue on your current medications as directed. Please refer to the Current Medication list given to you today.   Your physician recommends that you return for lab work in: Lipids

## 2013-11-12 NOTE — Progress Notes (Signed)
Patient ID: [name], male   DOB: 06/23/1936, 78 y.o.   MRN: [id number]      SUBJECTIVE: The patient presents for routine cardiovascular followup. He has a history of coronary artery bypass graft surgery in 2009, hypertension, hyperlipidemia, PSVT, and severe COPD requiring oxygen at night while sleeping. A recent chest x-ray showed no acute cardiopulmonary abnormalities. He denies chest pain and leg swelling. He very seldom has palpitations. He has chronic dyspnea on exertion that is stable.    No Known Allergies  Current Outpatient Prescriptions  Medication Sig Dispense Refill  . acetaminophen (TYLENOL) 500 MG tablet Take 500 mg by mouth every 6 (six) hours as needed for pain.      Marland Kitchen. albuterol (PROVENTIL HFA;VENTOLIN HFA) 108 (90 BASE) MCG/ACT inhaler Inhale 2 puffs into the lungs every 6 (six) hours as needed for wheezing or shortness of breath.      . ALPRAZolam (XANAX) 0.25 MG tablet Take 1 tablet (0.25 mg total) by mouth at bedtime as needed for sleep.  30 tablet  0  . amLODipine (NORVASC) 10 MG tablet Take 5 mg by mouth daily.       Marland Kitchen. aspirin 81 MG tablet Take 81 mg by mouth daily.      Marland Kitchen. docusate sodium (COLACE) 100 MG capsule Take 200 mg by mouth at bedtime as needed for constipation.       . furosemide (LASIX) 20 MG tablet Take 1 tablet by mouth Once daily as needed.      Marland Kitchen. HYDROcodone-acetaminophen (NORCO/VICODIN) 5-325 MG per tablet Take 1 tablet by mouth every 6 (six) hours as needed for pain.      . hydroxypropyl methylcellulose (ISOPTO TEARS) 2.5 % ophthalmic solution Place 1 drop into both eyes daily as needed. For redness      . Omega-3 Fatty Acids (EQL FISH OIL) 1000 MG CAPS Take 2 capsules (2,000 mg total) by mouth daily.  60 capsule  3  . omeprazole (PRILOSEC) 20 MG capsule Take 20 mg by mouth daily. 30 minutes before breakfast      . pravastatin (PRAVACHOL) 80 MG tablet Take 80 mg by mouth at bedtime.      Marland Kitchen. tiotropium (SPIRIVA) 18 MCG inhalation capsule Place  1 capsule (18 mcg total) into inhaler and inhale daily.  30 capsule  0   No current facility-administered medications for this visit.    Past Medical History  Diagnosis Date  . Hypertension   . Hyperlipidemia     excellent control  . Chronic kidney disease, stage III (moderate)   . Barrett's esophagus     frequent surveillance EGDs by Dr. Jena Gaussourk  . Chronic obstructive pulmonary disease     severe emphysema  . Cough, persistent   . Hyperkalemia     peak of 6.2 in 2009  . Colonic polyp     resected 2007 via colonoscope  . Diverticulosis   . Pneumonia     10/2010  . Coronary artery disease     CABG in 03/2008 for severe left main disease with critical RCA stenosis; EF of 45-50% with inferior wall hypokinesis; echocardiogram in 10/2008-no change in LV function; stress nuclear-conflicting information in report indicating inferior reversibility but inferior scar  . Gastroesophageal reflux disease   . Peptic ulcer disease     [facility]; treated for positive Helicobacter pylori  . History of orthostatic hypotension     noted following CABG surgery  . COPD (chronic obstructive pulmonary disease)  Past Surgical History  Procedure Laterality Date  . Laparoscopic gastrotomy w/ repair of ulcer      1999  . Coronary artery bypass graft  03/2008  . Colon surgery    . Cardiac surgery      History   Social History  . Marital Status: Married    Spouse Name: N/A    Number of Children: 2  . Years of Education: N/A   Occupational History  . retired     Animal nutritionist   Social History Main Topics  . Smoking status: Former Smoker -- 1.00 packs/day for 20 years    Types: Cigarettes, Pipe, Cigars    Quit date: 01/12/1998  . Smokeless tobacco: Never Used  . Alcohol Use: Yes     Comment: rare wine  . Drug Use: No  . Sexual Activity: Not on file   Other Topics Concern  . Not on file   Social History Narrative  . No narrative on file     Filed  Vitals:   11/12/13 1528  BP: 143/79  Pulse: 86  Height: 5\' 8"  (1.727 m)  Weight: 138 lb (62.596 kg)    PHYSICAL EXAM General: NAD Neck: No JVD, no thyromegaly or thyroid nodule.  Lungs: faint inspiratory wheezes with normal respiratory effort. CV: Nondisplaced PMI.  Heart regular but distant tones. Normal S1/S2, no S3/S4, no murmur.  No peripheral edema.  No carotid bruit.  Normal pedal pulses.  Abdomen: Soft, nontender, no hepatosplenomegaly, no distention.  Neurologic: Alert and oriented x 3.  Psych: Normal affect. Extremities: No clubbing or cyanosis.   ECG: reviewed and available in electronic records.      ASSESSMENT AND PLAN: 1. CAD with h/o CABG: symptomatically stable. Continue current therapy. 2. HTN: reasonably controlled. No medication changes at present. 3. Hyperlipidemia: will check lipids.  Dispo: f/u 6 months.  Prentice Docker, M.D., F.A.C.C.

## 2013-11-26 DIAGNOSIS — R109 Unspecified abdominal pain: Secondary | ICD-10-CM | POA: Diagnosis not present

## 2013-11-26 DIAGNOSIS — B999 Unspecified infectious disease: Secondary | ICD-10-CM | POA: Diagnosis not present

## 2013-11-26 DIAGNOSIS — I10 Essential (primary) hypertension: Secondary | ICD-10-CM | POA: Diagnosis not present

## 2013-11-26 DIAGNOSIS — I2510 Atherosclerotic heart disease of native coronary artery without angina pectoris: Secondary | ICD-10-CM | POA: Diagnosis not present

## 2013-11-26 DIAGNOSIS — Z125 Encounter for screening for malignant neoplasm of prostate: Secondary | ICD-10-CM | POA: Diagnosis not present

## 2013-11-26 DIAGNOSIS — IMO0002 Reserved for concepts with insufficient information to code with codable children: Secondary | ICD-10-CM | POA: Diagnosis not present

## 2013-11-26 DIAGNOSIS — Z79899 Other long term (current) drug therapy: Secondary | ICD-10-CM | POA: Diagnosis not present

## 2013-11-26 DIAGNOSIS — J441 Chronic obstructive pulmonary disease with (acute) exacerbation: Secondary | ICD-10-CM | POA: Diagnosis not present

## 2013-12-04 ENCOUNTER — Ambulatory Visit: Payer: Medicare Other | Admitting: Pulmonary Disease

## 2013-12-10 ENCOUNTER — Telehealth: Payer: Self-pay | Admitting: Pulmonary Disease

## 2013-12-10 DIAGNOSIS — J449 Chronic obstructive pulmonary disease, unspecified: Secondary | ICD-10-CM

## 2013-12-10 MED ORDER — FLUTICASONE FUROATE-VILANTEROL 100-25 MCG/INH IN AEPB: 1.0000 | INHALATION_SPRAY | Freq: Every day | RESPIRATORY_TRACT | Status: DC

## 2013-12-10 MED ORDER — TIOTROPIUM BROMIDE MONOHYDRATE 18 MCG IN CAPS: 18.0000 ug | ORAL_CAPSULE | Freq: Every day | RESPIRATORY_TRACT | Status: DC

## 2013-12-10 NOTE — Telephone Encounter (Signed)
Spoke with spouse. Samples will be left for pick up. Nothing further needed

## 2013-12-10 NOTE — Telephone Encounter (Signed)
PT CALLING FOR  SAMPLES OF BREO AND SPRIVIA.Joseph GriffinsStanley A [name]

## 2013-12-20 ENCOUNTER — Ambulatory Visit (INDEPENDENT_AMBULATORY_CARE_PROVIDER_SITE_OTHER): Payer: Medicare Other | Admitting: Pulmonary Disease

## 2013-12-20 ENCOUNTER — Encounter: Payer: Self-pay | Admitting: Pulmonary Disease

## 2013-12-20 VITALS — BP 112/60 | HR 90 | Temp 98.3°F | Ht 68.0 in | Wt 139.2 lb

## 2013-12-20 DIAGNOSIS — I25810 Atherosclerosis of coronary artery bypass graft(s) without angina pectoris: Secondary | ICD-10-CM | POA: Diagnosis not present

## 2013-12-20 DIAGNOSIS — J449 Chronic obstructive pulmonary disease, unspecified: Secondary | ICD-10-CM

## 2013-12-20 NOTE — Patient Instructions (Signed)
Stay on advair & spiriva Call us if green phlegm or chest colds

## 2013-12-20 NOTE — Progress Notes (Signed)
   Subjective:    Patient ID: [name], male    DOB: 01/14/1936, 78 y.o.   MRN: [id number]  HPI  PMD - [name]   77/M, ex- smoker- severe COPD on nocturnal O2 since 5/11 & cough.  5/10 PFTs >> severe obstruction, FEV1 39%, no BD response  He developed a chronic cough.since his heart surgery by Dr Cornelius Moraswen in 6/09  Barrett's esophagus 3 cm noted to be stable by Dr Jena Gaussourk.  H/o sinus surgery, tonsillectomy  CT chest 4/09 severe emphysema, scattered sub-cm nodules stable since 2005  CT chest 11/16/10 neg PE, stable nodules, small RLL infx Rxed for HCAP with cipro & zosyn  Modified Ba swallow nml  Ct angio 2/12 did not show any suspicious nodules  11/04/2011 Started on Daliresp by Primary MD - stopped 4/13, makes him 'mean' , lost wt ,  4/13 ono on RA showed only 2 min desatn < 88% - off O2  06/2013 - Uses O2 at night, does not need much in daytime    Hosp adm in 2/12 >> for cough syncope - admitted to tele but transferred to 2900 for another syncopal event  11/27/12 AECOPD/CAP admission 07/05/13  - graduated from pulm rehab    12/20/2013  10/02/13 Acute OV -zpak/pred  10/2013 doxycycline.  His wife was diagnosed with stage IV rectal cancer. She's been in and out of the hospital.  He denies any hemoptysis, orthopnea, PND, leg swelling. Patient states that over the last week. Congestion. Has worsenedWith worsening sinus congestion, and wheezing. Last night.  He denies any recent travel or antibiotic use. He's had no emergency room visits or hospitalizations. Over the last 6 months.   Improved , then c/o diarrhea x 1 d Good & bad days with breathing Cough always there  Review of Systems neg for any significant sore throat, dysphagia, itching, sneezing, nasal congestion or excess/ purulent secretions, fever, chills, sweats, unintended wt loss, pleuritic or exertional cp, hempoptysis, orthopnea pnd or change in chronic leg swelling. Also denies presyncope, palpitations, heartburn,  abdominal pain, nausea, vomiting, diarrhea or change in bowel or urinary habits, dysuria,hematuria, rash, arthralgias, visual complaints, headache, numbness weakness or ataxia.     Objective:   Physical Exam  Gen. Pleasant, well-nourished, in no distress ENT - no lesions, no post nasal drip Neck: No JVD, no thyromegaly, no carotid bruits Lungs: no use of accessory muscles, no dullness to percussion, clear without rales or rhonchi  Cardiovascular: Rhythm regular, heart sounds  normal, no murmurs or gallops, no peripheral edema Musculoskeletal: No deformities, no cyanosis or clubbing        Assessment & Plan:

## 2013-12-21 ENCOUNTER — Telehealth: Payer: Self-pay | Admitting: Pulmonary Disease

## 2013-12-21 MED ORDER — DOXYCYCLINE HYCLATE 100 MG PO TABS: 100.0000 mg | ORAL_TABLET | Freq: Two times a day (BID) | ORAL | Status: DC

## 2013-12-21 NOTE — Telephone Encounter (Signed)
Pt is aware of RA's recs. Rx will be sent to [facility]. Nothing further is needed.

## 2013-12-21 NOTE — Telephone Encounter (Signed)
Pt was seen by RA yesterday. He can in reporting cough and production of green mucus x2 months. RA advised him to call back if this continued so we could send in some medication. States that since his appointment yesterday his symptoms have gotten worse. Would like something called in.  RA - please advise. Thanks.

## 2013-12-21 NOTE — Telephone Encounter (Signed)
Doxy 100 x 7 days Mucinex bid

## 2013-12-24 NOTE — Assessment & Plan Note (Signed)
Stay on advair & spiriva Call us if green phlegm or chest colds Screen for depression on future visits due to wife's terminal illness

## 2014-01-23 DIAGNOSIS — IMO0002 Reserved for concepts with insufficient information to code with codable children: Secondary | ICD-10-CM | POA: Diagnosis not present

## 2014-01-23 DIAGNOSIS — I10 Essential (primary) hypertension: Secondary | ICD-10-CM | POA: Diagnosis not present

## 2014-01-23 DIAGNOSIS — K219 Gastro-esophageal reflux disease without esophagitis: Secondary | ICD-10-CM | POA: Diagnosis not present

## 2014-01-23 DIAGNOSIS — I2510 Atherosclerotic heart disease of native coronary artery without angina pectoris: Secondary | ICD-10-CM | POA: Diagnosis not present

## 2014-01-23 DIAGNOSIS — J209 Acute bronchitis, unspecified: Secondary | ICD-10-CM | POA: Diagnosis not present

## 2014-01-24 ENCOUNTER — Telehealth: Payer: Self-pay | Admitting: Pulmonary Disease

## 2014-01-24 DIAGNOSIS — J4489 Other specified chronic obstructive pulmonary disease: Secondary | ICD-10-CM

## 2014-01-24 DIAGNOSIS — J449 Chronic obstructive pulmonary disease, unspecified: Secondary | ICD-10-CM

## 2014-01-24 MED ORDER — TIOTROPIUM BROMIDE MONOHYDRATE 18 MCG IN CAPS
18.0000 ug | ORAL_CAPSULE | Freq: Every day | RESPIRATORY_TRACT | Status: DC
Start: 2014-01-24 — End: 2014-04-26

## 2014-01-24 MED ORDER — FLUTICASONE FUROATE-VILANTEROL 100-25 MCG/INH IN AEPB: 1.0000 | INHALATION_SPRAY | Freq: Every day | RESPIRATORY_TRACT | Status: DC

## 2014-01-24 NOTE — Telephone Encounter (Signed)
Called home # and line rang several times. No answer and no VM Called mobile # and LMTCB x1 Samples left for pick up

## 2014-01-25 DIAGNOSIS — I509 Heart failure, unspecified: Secondary | ICD-10-CM | POA: Diagnosis not present

## 2014-01-25 DIAGNOSIS — Z9981 Dependence on supplemental oxygen: Secondary | ICD-10-CM | POA: Diagnosis not present

## 2014-01-25 DIAGNOSIS — I2510 Atherosclerotic heart disease of native coronary artery without angina pectoris: Secondary | ICD-10-CM | POA: Diagnosis not present

## 2014-01-25 DIAGNOSIS — J440 Chronic obstructive pulmonary disease with acute lower respiratory infection: Secondary | ICD-10-CM | POA: Diagnosis not present

## 2014-01-25 DIAGNOSIS — I10 Essential (primary) hypertension: Secondary | ICD-10-CM | POA: Diagnosis not present

## 2014-01-25 DIAGNOSIS — Z87891 Personal history of nicotine dependence: Secondary | ICD-10-CM | POA: Diagnosis not present

## 2014-01-25 DIAGNOSIS — Z9181 History of falling: Secondary | ICD-10-CM | POA: Diagnosis not present

## 2014-01-25 DIAGNOSIS — D649 Anemia, unspecified: Secondary | ICD-10-CM | POA: Diagnosis not present

## 2014-01-25 NOTE — Telephone Encounter (Signed)
Pt advised. Jaycee Mckellips, CMA  

## 2014-01-28 ENCOUNTER — Encounter (HOSPITAL_COMMUNITY): Payer: Self-pay | Admitting: Emergency Medicine

## 2014-01-28 ENCOUNTER — Inpatient Hospital Stay (HOSPITAL_COMMUNITY)
Admission: EM | Admit: 2014-01-28 | Discharge: 2014-02-06 | DRG: 190 | Disposition: A | Discharge status: Skilled Nursing Facility | Payer: Medicare Other | Attending: Internal Medicine | Admitting: Internal Medicine

## 2014-01-28 ENCOUNTER — Emergency Department (HOSPITAL_COMMUNITY): Payer: Medicare Other

## 2014-01-28 DIAGNOSIS — Z9981 Dependence on supplemental oxygen: Secondary | ICD-10-CM | POA: Diagnosis not present

## 2014-01-28 DIAGNOSIS — R0602 Shortness of breath: Secondary | ICD-10-CM | POA: Diagnosis not present

## 2014-01-28 DIAGNOSIS — R4789 Other speech disturbances: Secondary | ICD-10-CM | POA: Diagnosis not present

## 2014-01-28 DIAGNOSIS — IMO0002 Reserved for concepts with insufficient information to code with codable children: Secondary | ICD-10-CM | POA: Diagnosis not present

## 2014-01-28 DIAGNOSIS — K219 Gastro-esophageal reflux disease without esophagitis: Secondary | ICD-10-CM | POA: Diagnosis present

## 2014-01-28 DIAGNOSIS — J9620 Acute and chronic respiratory failure, unspecified whether with hypoxia or hypercapnia: Secondary | ICD-10-CM | POA: Diagnosis present

## 2014-01-28 DIAGNOSIS — R059 Cough, unspecified: Secondary | ICD-10-CM | POA: Diagnosis not present

## 2014-01-28 DIAGNOSIS — Z87891 Personal history of nicotine dependence: Secondary | ICD-10-CM

## 2014-01-28 DIAGNOSIS — E785 Hyperlipidemia, unspecified: Secondary | ICD-10-CM | POA: Diagnosis present

## 2014-01-28 DIAGNOSIS — R279 Unspecified lack of coordination: Secondary | ICD-10-CM | POA: Diagnosis not present

## 2014-01-28 DIAGNOSIS — K5900 Constipation, unspecified: Secondary | ICD-10-CM | POA: Diagnosis not present

## 2014-01-28 DIAGNOSIS — Z7982 Long term (current) use of aspirin: Secondary | ICD-10-CM

## 2014-01-28 DIAGNOSIS — J438 Other emphysema: Secondary | ICD-10-CM | POA: Diagnosis not present

## 2014-01-28 DIAGNOSIS — E43 Unspecified severe protein-calorie malnutrition: Secondary | ICD-10-CM | POA: Diagnosis not present

## 2014-01-28 DIAGNOSIS — J441 Chronic obstructive pulmonary disease with (acute) exacerbation: Secondary | ICD-10-CM | POA: Diagnosis not present

## 2014-01-28 DIAGNOSIS — Z79899 Other long term (current) drug therapy: Secondary | ICD-10-CM

## 2014-01-28 DIAGNOSIS — I2510 Atherosclerotic heart disease of native coronary artery without angina pectoris: Secondary | ICD-10-CM | POA: Diagnosis present

## 2014-01-28 DIAGNOSIS — F29 Unspecified psychosis not due to a substance or known physiological condition: Secondary | ICD-10-CM | POA: Diagnosis not present

## 2014-01-28 DIAGNOSIS — Z951 Presence of aortocoronary bypass graft: Secondary | ICD-10-CM | POA: Diagnosis not present

## 2014-01-28 DIAGNOSIS — N183 Chronic kidney disease, stage 3 unspecified: Secondary | ICD-10-CM | POA: Diagnosis present

## 2014-01-28 DIAGNOSIS — I129 Hypertensive chronic kidney disease with stage 1 through stage 4 chronic kidney disease, or unspecified chronic kidney disease: Secondary | ICD-10-CM | POA: Diagnosis present

## 2014-01-28 DIAGNOSIS — M6281 Muscle weakness (generalized): Secondary | ICD-10-CM | POA: Diagnosis not present

## 2014-01-28 DIAGNOSIS — R05 Cough: Secondary | ICD-10-CM

## 2014-01-28 DIAGNOSIS — I10 Essential (primary) hypertension: Secondary | ICD-10-CM | POA: Diagnosis not present

## 2014-01-28 DIAGNOSIS — I259 Chronic ischemic heart disease, unspecified: Secondary | ICD-10-CM | POA: Diagnosis not present

## 2014-01-28 DIAGNOSIS — K4090 Unilateral inguinal hernia, without obstruction or gangrene, not specified as recurrent: Secondary | ICD-10-CM | POA: Diagnosis present

## 2014-01-28 DIAGNOSIS — R262 Difficulty in walking, not elsewhere classified: Secondary | ICD-10-CM | POA: Diagnosis not present

## 2014-01-28 DIAGNOSIS — Z8249 Family history of ischemic heart disease and other diseases of the circulatory system: Secondary | ICD-10-CM

## 2014-01-28 DIAGNOSIS — R1311 Dysphagia, oral phase: Secondary | ICD-10-CM | POA: Diagnosis not present

## 2014-01-28 DIAGNOSIS — R053 Chronic cough: Secondary | ICD-10-CM

## 2014-01-28 DIAGNOSIS — J449 Chronic obstructive pulmonary disease, unspecified: Secondary | ICD-10-CM | POA: Diagnosis not present

## 2014-01-28 DIAGNOSIS — E870 Hyperosmolality and hypernatremia: Secondary | ICD-10-CM | POA: Diagnosis not present

## 2014-01-28 LAB — BASIC METABOLIC PANEL
BUN: 13 mg/dL (ref 6–23)
CALCIUM: 8.6 mg/dL (ref 8.4–10.5)
CO2: 28 meq/L (ref 19–32)
Chloride: 99 mEq/L (ref 96–112)
Creatinine, Ser: 0.53 mg/dL (ref 0.50–1.35)
GFR calc Af Amer: >90 mL/min (ref >90)
GFR calc non Af Amer: >90 mL/min (ref >90)
GLUCOSE: 112 mg/dL — AB (ref 70–99)
Potassium: 3.5 mEq/L — ABNORMAL LOW (ref 3.7–5.3)
SODIUM: 140 meq/L (ref 137–147)

## 2014-01-28 LAB — CBC WITH DIFFERENTIAL/PLATELET
Basophils Absolute: 0.1 10*3/uL (ref 0.0–0.1)
Basophils Relative: 0 % (ref 0–1)
EOS PCT: 0 % (ref 0–5)
Eosinophils Absolute: 0.1 10*3/uL (ref 0.0–0.7)
HEMATOCRIT: 45.1 % (ref 39.0–52.0)
HEMOGLOBIN: 15.1 g/dL (ref 13.0–17.0)
LYMPHS ABS: 0.8 10*3/uL (ref 0.7–4.0)
LYMPHS PCT: 4 % — AB (ref 12–46)
MCH: 32.5 pg (ref 26.0–34.0)
MCHC: 33.5 g/dL (ref 30.0–36.0)
MCV: 97 fL (ref 78.0–100.0)
MONO ABS: 1.3 10*3/uL — AB (ref 0.1–1.0)
MONOS PCT: 7 % (ref 3–12)
Neutro Abs: 16.6 10*3/uL — ABNORMAL HIGH (ref 1.7–7.7)
Neutrophils Relative %: 88 % — ABNORMAL HIGH (ref 43–77)
PLATELETS: 437 10*3/uL — AB (ref 150–400)
RBC: 4.65 MIL/uL (ref 4.22–5.81)
RDW: 12.9 % (ref 11.5–15.5)
WBC: 18.9 10*3/uL — AB (ref 4.0–10.5)

## 2014-01-28 MED ORDER — DOCUSATE SODIUM 100 MG PO CAPS
200.0000 mg | ORAL_CAPSULE | Freq: Every evening | ORAL | Status: DC | PRN
  Administered 2014-01-29 – 2014-01-31 (×2): 200 mg via ORAL
  Filled 2014-01-28 (×2): qty 2

## 2014-01-28 MED ORDER — DEXTROSE 5 % IV SOLN
INTRAVENOUS | Status: AC
  Filled 2014-01-28: qty 10

## 2014-01-28 MED ORDER — ALBUTEROL SULFATE (2.5 MG/3ML) 0.083% IN NEBU
2.5000 mg | INHALATION_SOLUTION | RESPIRATORY_TRACT | Status: DC | PRN
  Administered 2014-01-28 – 2014-02-06 (×6): 2.5 mg via RESPIRATORY_TRACT
  Filled 2014-01-28 (×7): qty 3

## 2014-01-28 MED ORDER — IPRATROPIUM-ALBUTEROL 0.5-2.5 (3) MG/3ML IN SOLN
3.0000 mL | Freq: Four times a day (QID) | RESPIRATORY_TRACT | Status: DC
  Administered 2014-01-29 – 2014-02-06 (×35): 3 mL via RESPIRATORY_TRACT
  Filled 2014-01-28 (×36): qty 3

## 2014-01-28 MED ORDER — METHYLPREDNISOLONE SODIUM SUCC 125 MG IJ SOLR
125.0000 mg | Freq: Four times a day (QID) | INTRAMUSCULAR | Status: DC
  Administered 2014-01-28 – 2014-01-31 (×13): 125 mg via INTRAVENOUS
  Filled 2014-01-28 (×13): qty 2

## 2014-01-28 MED ORDER — PANTOPRAZOLE SODIUM 40 MG PO TBEC
40.0000 mg | DELAYED_RELEASE_TABLET | Freq: Every day | ORAL | Status: DC
  Administered 2014-01-29 – 2014-02-06 (×9): 40 mg via ORAL
  Filled 2014-01-28 (×10): qty 1

## 2014-01-28 MED ORDER — FLUTICASONE FUROATE-VILANTEROL 100-25 MCG/INH IN AEPB
1.0000 | INHALATION_SPRAY | Freq: Every day | RESPIRATORY_TRACT | Status: DC
  Filled 2014-01-28: qty 1

## 2014-01-28 MED ORDER — AZITHROMYCIN 500 MG IV SOLR
500.0000 mg | INTRAVENOUS | Status: DC
  Administered 2014-01-28 – 2014-01-31 (×4): 500 mg via INTRAVENOUS
  Filled 2014-01-28 (×3): qty 500

## 2014-01-28 MED ORDER — PREDNISONE 50 MG PO TABS
60.0000 mg | ORAL_TABLET | Freq: Once | ORAL | Status: AC
  Administered 2014-01-28: 60 mg via ORAL
  Filled 2014-01-28 (×2): qty 1

## 2014-01-28 MED ORDER — OMEGA-3-ACID ETHYL ESTERS 1 G PO CAPS
2.0000 | ORAL_CAPSULE | Freq: Every day | ORAL | Status: DC
  Administered 2014-01-29 – 2014-02-06 (×9): 2 g via ORAL
  Filled 2014-01-28 (×10): qty 2

## 2014-01-28 MED ORDER — ALBUTEROL SULFATE (2.5 MG/3ML) 0.083% IN NEBU
10.0000 mg | INHALATION_SOLUTION | Freq: Once | RESPIRATORY_TRACT | Status: AC
  Administered 2014-01-28: 10 mg via RESPIRATORY_TRACT
  Filled 2014-01-28: qty 12

## 2014-01-28 MED ORDER — DEXTROSE 5 % IV SOLN
INTRAVENOUS | Status: AC
  Filled 2014-01-28: qty 500

## 2014-01-28 MED ORDER — ASPIRIN EC 81 MG PO TBEC
81.0000 mg | DELAYED_RELEASE_TABLET | Freq: Every day | ORAL | Status: DC
  Administered 2014-01-29 – 2014-02-06 (×9): 81 mg via ORAL
  Filled 2014-01-28 (×10): qty 1

## 2014-01-28 MED ORDER — FUROSEMIDE 20 MG PO TABS
20.0000 mg | ORAL_TABLET | Freq: Every day | ORAL | Status: DC
  Administered 2014-01-29 – 2014-02-06 (×9): 20 mg via ORAL
  Filled 2014-01-28 (×11): qty 1

## 2014-01-28 MED ORDER — ALPRAZOLAM 0.25 MG PO TABS
0.2500 mg | ORAL_TABLET | Freq: Every evening | ORAL | Status: DC | PRN
  Administered 2014-01-29 – 2014-02-04 (×7): 0.25 mg via ORAL
  Filled 2014-01-28 (×7): qty 1

## 2014-01-28 MED ORDER — ALBUTEROL SULFATE HFA 108 (90 BASE) MCG/ACT IN AERS
2.0000 | INHALATION_SPRAY | Freq: Four times a day (QID) | RESPIRATORY_TRACT | Status: DC | PRN
  Filled 2014-01-28: qty 6.7

## 2014-01-28 MED ORDER — ACETAMINOPHEN 500 MG PO TABS
500.0000 mg | ORAL_TABLET | Freq: Four times a day (QID) | ORAL | Status: DC | PRN
  Administered 2014-01-30 – 2014-02-05 (×15): 500 mg via ORAL
  Filled 2014-01-28 (×16): qty 1

## 2014-01-28 MED ORDER — ONDANSETRON HCL 4 MG PO TABS: 4.0000 mg | ORAL_TABLET | Freq: Four times a day (QID) | ORAL | Status: DC | PRN

## 2014-01-28 MED ORDER — SODIUM CHLORIDE 0.9 % IV SOLN
INTRAVENOUS | Status: DC
  Administered 2014-01-28 – 2014-01-30 (×2): via INTRAVENOUS

## 2014-01-28 MED ORDER — HEPARIN SODIUM (PORCINE) 5000 UNIT/ML IJ SOLN
5000.0000 [IU] | Freq: Three times a day (TID) | INTRAMUSCULAR | Status: DC
  Administered 2014-01-28 – 2014-02-06 (×27): 5000 [IU] via SUBCUTANEOUS
  Filled 2014-01-28 (×26): qty 1

## 2014-01-28 MED ORDER — AMLODIPINE BESYLATE 5 MG PO TABS
5.0000 mg | ORAL_TABLET | Freq: Every day | ORAL | Status: DC
  Administered 2014-01-29 – 2014-02-06 (×9): 5 mg via ORAL
  Filled 2014-01-28 (×10): qty 1

## 2014-01-28 MED ORDER — SIMVASTATIN 20 MG PO TABS
40.0000 mg | ORAL_TABLET | Freq: Every day | ORAL | Status: DC
  Administered 2014-01-28: 40 mg via ORAL
  Filled 2014-01-28: qty 2

## 2014-01-28 MED ORDER — DEXTROSE 5 % IV SOLN
1.0000 g | INTRAVENOUS | Status: AC
  Administered 2014-01-28 – 2014-02-03 (×7): 1 g via INTRAVENOUS
  Filled 2014-01-28 (×6): qty 10

## 2014-01-28 MED ORDER — ONDANSETRON HCL 4 MG/2ML IJ SOLN: 4.0000 mg | Freq: Four times a day (QID) | INTRAMUSCULAR | Status: DC | PRN

## 2014-01-28 MED ORDER — TIOTROPIUM BROMIDE MONOHYDRATE 18 MCG IN CAPS
18.0000 ug | ORAL_CAPSULE | Freq: Every day | RESPIRATORY_TRACT | Status: DC
  Administered 2014-01-29 – 2014-01-30 (×2): 18 ug via RESPIRATORY_TRACT
  Filled 2014-01-28: qty 5

## 2014-01-28 NOTE — ED Provider Notes (Signed)
CSN: [id number]     Arrival date & time 01/28/14  1220 History  This chart was scribed for Joseph SouSam Gaetan Spieker, MD by [name], ED Scribe. This patient was seen in room APA02/APA02 and the patient's care was started at 1:24 PM.    Chief Complaint  Patient presents with  . Shortness of Breath   The history is provided by the patient. No language interpreter was used.   HPI Comments: [name] is a 78 y.o. male with a history of COPD (patient wears 3 L of oxygen at home), chronic cough, pneumonia, and CABG (in 2009) who presents to the Emergency Department complaining of shortness of breath with associated chest congestion and a productive cough onset about a week ago. He states that the shortness of breath is exacerbated with exertion. He reports using his albuterol inhaler at home without significant relief. Patient reports that he has been feeling "run down" since his wife passed away last week. He denies fever. Patient states that he does not take steroids. His son reports that the patient had a few episodes of non-bilious, non-bloody emesis and watery diarrhea a few days ago, but has been taking Pepto-Bismol at home with temporary relief. He denies any allergies to medications. Patient also has a history of HTN, hyperlipidemia, stage III chronic kidney disease, hyperkalemia, diverticulosis, coronary artery disease, GERD, peptic ulcer disease. Patient is a former smoker, quit in 1999, and does not drink alcohol.   Pulmonologist- Dr. Vassie [name]   Past Medical History  Diagnosis Date  . Hypertension   . Hyperlipidemia     excellent control  . Chronic kidney disease, stage III (moderate)   . Barrett's esophagus     frequent surveillance EGDs by Dr. Jena [name]  . Chronic obstructive pulmonary disease     severe emphysema  . Cough, persistent   . Hyperkalemia     peak of 6.2 in 2009  . Colonic polyp     resected 2007 via colonoscope  . Diverticulosis   . Pneumonia     10/2010  . Coronary  artery disease     CABG in 03/2008 for severe left main disease with critical RCA stenosis; EF of 45-50% with inferior wall hypokinesis; echocardiogram in 10/2008-no change in LV function; stress nuclear-conflicting information in report indicating inferior reversibility but inferior scar  . Gastroesophageal reflux disease   . Peptic ulcer disease     [facility]; treated for positive Helicobacter pylori  . History of orthostatic hypotension     noted following CABG surgery  . COPD (chronic obstructive pulmonary disease)    Past Surgical History  Procedure Laterality Date  . Laparoscopic gastrotomy w/ repair of ulcer      1999  . Coronary artery bypass graft  03/2008  . Colon surgery    . Cardiac surgery     Family History  Problem Relation Age of Onset  . Heart disease Mother   . Heart disease Father    History  Substance Use Topics  . Smoking status: Former Smoker -- 1.00 packs/day for 20 years    Types: Cigarettes, Pipe, Cigars    Quit date: 01/12/1998  . Smokeless tobacco: Never Used  . Alcohol Use: Yes     Comment: rare wine    Review of Systems  Constitutional: Negative.  Negative for fever.  HENT: Positive for congestion.   Respiratory: Positive for cough and shortness of breath.   Cardiovascular: Negative.   Gastrointestinal: Positive for  vomiting and diarrhea.  Genitourinary:       Painful area right groin for one month  Musculoskeletal: Negative.   Skin: Negative.   Neurological: Negative.   Psychiatric/Behavioral: Negative.       Allergies  Review of patient's allergies indicates no known allergies.  Home Medications   Current Outpatient Rx  Name  Route  Sig  Dispense  Refill  . acetaminophen (TYLENOL) 500 MG tablet   Oral   Take 500 mg by mouth every 6 (six) hours as needed for pain.         Marland Kitchen. albuterol (PROVENTIL HFA;VENTOLIN HFA) 108 (90 BASE) MCG/ACT inhaler   Inhalation   Inhale 2 puffs into the lungs  every 6 (six) hours as needed for wheezing or shortness of breath.         . ALPRAZolam (XANAX) 0.25 MG tablet   Oral   Take 1 tablet (0.25 mg total) by mouth at bedtime as needed for sleep.   30 tablet   0   . amLODipine (NORVASC) 10 MG tablet   Oral   Take 5 mg by mouth daily.          Marland Kitchen. aspirin 81 MG tablet   Oral   Take 81 mg by mouth daily.         Marland Kitchen. docusate sodium (COLACE) 100 MG capsule   Oral   Take 200 mg by mouth at bedtime as needed for constipation.          . Fluticasone Furoate-Vilanterol 100-25 MCG/INH AEPB   Inhalation   Inhale 1 puff into the lungs daily.   3 each   0   . furosemide (LASIX) 20 MG tablet   Oral   Take 1 tablet by mouth daily.          Marland Kitchen. levofloxacin (LEVAQUIN) 500 MG tablet   Oral   Take 1 tablet by mouth daily. Starting 01/23/2014 x 10 days.         . Omega-3 Fatty Acids (EQL FISH OIL) 1000 MG CAPS   Oral   Take 2 capsules (2,000 mg total) by mouth daily.   60 capsule   3   . omeprazole (PRILOSEC) 20 MG capsule   Oral   Take 20 mg by mouth daily. 30 minutes before breakfast         . pravastatin (PRAVACHOL) 80 MG tablet   Oral   Take 80 mg by mouth at bedtime.         Marland Kitchen. tiotropium (SPIRIVA) 18 MCG inhalation capsule   Inhalation   Place 1 capsule (18 mcg total) into inhaler and inhale daily.   30 capsule   0    Triage Vitals: BP 131/74  Pulse 96  Temp(Src) 97.8 F (36.6 C) (Oral)  Resp 20  Ht 5\' 9"  (1.753 m)  Wt 132 lb (59.875 kg)  BMI 19.48 kg/m2  SpO2 87%  Physical Exam  Nursing note and vitals reviewed. Constitutional: He appears well-developed and well-nourished.  HENT:  Head: Normocephalic and atraumatic.  Eyes: Conjunctivae are normal. Pupils are equal, round, and reactive to light.  Neck: Neck supple. No tracheal deviation present. No thyromegaly present.  Cardiovascular: Normal rate and regular rhythm.   No murmur heard. Pulmonary/Chest: Effort normal.  Coarse rhonchi. Retractions.    Abdominal: Soft. Bowel sounds are normal. He exhibits no distension. There is no tenderness.  Genitourinary: Penis normal.  Tenderness at right inguinal area worse with Valsalva consistent with for non-incarcerated right inguinal hernia  Musculoskeletal: Normal range of motion. He exhibits no edema and no tenderness.  Neurological: He is alert. Coordination normal.  Skin: Skin is warm and dry. No rash noted.  Psychiatric: He has a normal mood and affect.    ED Course  Procedures (including critical care time)  DIAGNOSTIC STUDIES: Oxygen Saturation is 87% on room air, low by my interpretation.    COORDINATION OF CARE: 12:46 PM- Ordered EKG and a chest x-ray.   1:28 PM- Will order CBC, BMP. Will order an albuterol breathing treatment and prednisone to manage symptoms. Discussed treatment plan with patient at bedside and patient verbalized agreement.   Results for orders placed during the hospital encounter of 01/28/14  CBC WITH DIFFERENTIAL      Result Value Ref Range   WBC 18.9 (*) 4.0 - 10.5 K/uL   RBC 4.65  4.22 - 5.81 MIL/uL   Hemoglobin 15.1  13.0 - 17.0 g/dL   HCT 96.0  45.4 - 09.8 %   MCV 97.0  78.0 - 100.0 fL   MCH 32.5  26.0 - 34.0 pg   MCHC 33.5  30.0 - 36.0 g/dL   RDW 11.9  14.7 - 82.9 %   Platelets 437 (*) 150 - 400 K/uL   Neutrophils Relative % 88 (*) 43 - 77 %   Neutro Abs 16.6 (*) 1.7 - 7.7 K/uL   Lymphocytes Relative 4 (*) 12 - 46 %   Lymphs Abs 0.8  0.7 - 4.0 K/uL   Monocytes Relative 7  3 - 12 %   Monocytes Absolute 1.3 (*) 0.1 - 1.0 K/uL   Eosinophils Relative 0  0 - 5 %   Eosinophils Absolute 0.1  0.0 - 0.7 K/uL   Basophils Relative 0  0 - 1 %   Basophils Absolute 0.1  0.0 - 0.1 K/uL  BASIC METABOLIC PANEL      Result Value Ref Range   Sodium 140  137 - 147 mEq/L   Potassium 3.5 (*) 3.7 - 5.3 mEq/L   Chloride 99  96 - 112 mEq/L   CO2 28  19 - 32 mEq/L   Glucose, Bld 112 (*) 70 - 99 mg/dL   BUN 13  6 - 23 mg/dL   Creatinine, Ser 5.62  0.50 - 1.35  mg/dL   Calcium 8.6  8.4 - 13.0 mg/dL   GFR calc non Af Amer >90  >90 mL/min   GFR calc Af Amer >90  >90 mL/min   Dg Chest 2 View  01/28/2014   CLINICAL DATA:  Shortness of breath and cough  EXAM: CHEST  2 VIEW  COMPARISON:  November 07, 2013  FINDINGS: There is underlying emphysematous change. There is scarring in the right mid lung and in both bases. There is no edema or consolidation.  The heart size is normal. Pulmonary vascularity reflects underlying emphysema. Aorta is tortuous with atherosclerotic change. Patient is status post median sternotomy. No bone lesions.  IMPRESSION: Underlying emphysematous change.  No edema or consolidation.   Electronically Signed   By: Bretta Bang M.D.   On: 01/28/2014 13:06      EKG Interpretation   Date/Time:  Monday January 28 2014 14:05:18 EDT Ventricular Rate:  89 PR Interval:  162 QRS Duration: 102 QT Interval:  392 QTC Calculation: 476 R Axis:   -3 Text Interpretation:  Normal sinus rhythm with sinus arrhythmia  Anteroseptal infarct , age undetermined Abnormal ECG No significant change  since last tracing Confirmed by Ethelda Chick  MD, Tramayne Sebesta (  [id number]) on 01/28/2014  2:09:47 PM     3:25 PM after one hour of continuous nebulization therapy and prednisone orally patient breathing is slightly improved, not at baseline. He becomes dyspneic upon standing pulse oximetry dropped to 91% on 3 L of oxygen. Chest x-ray viewed by me Results for orders placed during the hospital encounter of 01/28/14  CBC WITH DIFFERENTIAL      Result Value Ref Range   WBC 18.9 (*) 4.0 - 10.5 K/uL   RBC 4.65  4.22 - 5.81 MIL/uL   Hemoglobin 15.1  13.0 - 17.0 g/dL   HCT 60.4  54.0 - 98.1 %   MCV 97.0  78.0 - 100.0 fL   MCH 32.5  26.0 - 34.0 pg   MCHC 33.5  30.0 - 36.0 g/dL   RDW 19.1  47.8 - 29.5 %   Platelets 437 (*) 150 - 400 K/uL   Neutrophils Relative % 88 (*) 43 - 77 %   Neutro Abs 16.6 (*) 1.7 - 7.7 K/uL   Lymphocytes Relative 4 (*) 12 - 46 %   Lymphs Abs 0.8   0.7 - 4.0 K/uL   Monocytes Relative 7  3 - 12 %   Monocytes Absolute 1.3 (*) 0.1 - 1.0 K/uL   Eosinophils Relative 0  0 - 5 %   Eosinophils Absolute 0.1  0.0 - 0.7 K/uL   Basophils Relative 0  0 - 1 %   Basophils Absolute 0.1  0.0 - 0.1 K/uL  BASIC METABOLIC PANEL      Result Value Ref Range   Sodium 140  137 - 147 mEq/L   Potassium 3.5 (*) 3.7 - 5.3 mEq/L   Chloride 99  96 - 112 mEq/L   CO2 28  19 - 32 mEq/L   Glucose, Bld 112 (*) 70 - 99 mg/dL   BUN 13  6 - 23 mg/dL   Creatinine, Ser 6.21  0.50 - 1.35 mg/dL   Calcium 8.6  8.4 - 30.8 mg/dL   GFR calc non Af Amer >90  >90 mL/min   GFR calc Af Amer >90  >90 mL/min   Dg Chest 2 View  01/28/2014   CLINICAL DATA:  Shortness of breath and cough  EXAM: CHEST  2 VIEW  COMPARISON:  November 07, 2013  FINDINGS: There is underlying emphysematous change. There is scarring in the right mid lung and in both bases. There is no edema or consolidation.  The heart size is normal. Pulmonary vascularity reflects underlying emphysema. Aorta is tortuous with atherosclerotic change. Patient is status post median sternotomy. No bone lesions.  IMPRESSION: Underlying emphysematous change.  No edema or consolidation.   Electronically Signed   By: Bretta Bang M.D.   On: 01/28/2014 13:06    MDM   Final diagnoses:  None  pt's primary care MD Dr. Gareth Eagle with Dr.N. Karilyn Cota. Plan inpatient stay medical surgical bed Diagnosis exacerbation of COPD    I personally performed the services described in this documentation, which was scribed in my presence. The recorded information has been reviewed and considered.    Joseph Sou, MD 01/28/14 [phone]

## 2014-01-28 NOTE — ED Notes (Signed)
C/o not being able to breathe.   Has history of COPD.  Wife died last week at hospice and he was staying with her.  Son concerned that he has got run down.

## 2014-01-28 NOTE — H&P (Signed)
[facility] History and Physical  Joseph CraftWayne J Blatchford ZOX:[id number] DOB: 12/02/1935 DOA: 01/28/2014  Referring physician: ER. PCP: Cassell SmilesFUSCO,LAWRENCE J., MD   Chief Complaint: Dyspnea, productive cough.  HPI: [name] is a 78 y.o. male  This is a 78 year old man who has underlying COPD and has home oxygen at night, ex-smoker, who presents with approximately 3 to four-day history of dyspnea associated with it cough productive of yellow sputum. Unfortunately, his wife died in the last week or so. He denies any specific chest pain, fevers. He has been feeling rundown and has had a poor appetite. Despite being in the emergency room with continuous nebulizers, he has not improved and now he is being admitted for further treatment.   Review of Systems:  Constitutional:  No weight loss, night sweats, Fevers, chills.  HEENT:  No headaches, Difficulty swallowing,Tooth/dental problems,Sore throat,  No sneezing, itching, ear ache, nasal congestion, post nasal drip,  Cardio-vascular:  No chest pain, Orthopnea, PND, swelling in lower extremities, anasarca, dizziness, palpitations  GI:  No heartburn, indigestion, abdominal pain, nausea, vomiting, diarrhea, change in bowel habits.  Skin:  no rash or lesions.  GU:  no dysuria, change in color of urine, no urgency or frequency. No flank pain.  Musculoskeletal:  No joint pain or swelling. No decreased range of motion. No back pain.  Psych:  No change in mood or affect. No depression or anxiety. No memory loss.   Past Medical History  Diagnosis Date  . Hypertension   . Hyperlipidemia     excellent control  . Chronic kidney disease, stage III (moderate)   . Barrett's esophagus     frequent surveillance EGDs by Dr. Jena Gaussourk  . Chronic obstructive pulmonary disease     severe emphysema  . Cough, persistent   . Hyperkalemia     peak of 6.2 in 2009  . Colonic polyp     resected 2007 via colonoscope  . Diverticulosis   . Pneumonia       10/2010  . Coronary artery disease     CABG in 03/2008 for severe left main disease with critical RCA stenosis; EF of 45-50% with inferior wall hypokinesis; echocardiogram in 10/2008-no change in LV function; stress nuclear-conflicting information in report indicating inferior reversibility but inferior scar  . Gastroesophageal reflux disease   . Peptic ulcer disease     perfor[facility] in 1999; treated for positive Helicobacter pylori  . History of orthostatic hypotension     noted following CABG surgery  . COPD (chronic obstructive pulmonary disease)    Past Surgical History  Procedure Laterality Date  . Laparoscopic gastrotomy w/ repair of ulcer      1999  . Coronary artery bypass graft  03/2008  . Colon surgery    . Cardiac surgery     Social History:  reports that he quit smoking about 16 years ago. His smoking use included Cigarettes, Pipe, and Cigars. He has a 20 pack-year smoking history. He has never used smokeless tobacco. He reports that he drinks alcohol. He reports that he does not use illicit drugs.  No Known Allergies  Family History  Problem Relation Age of Onset  . Heart disease Mother   . Heart disease Father      Prior to Admission medications   Medication Sig Start Date End Date Taking? Authorizing Provider  acetaminophen (TYLENOL) 500 MG tablet Take 500 mg by mouth every 6 (six) hours as needed for pain.   Yes Historical  Provider, MD  albuterol (PROVENTIL HFA;VENTOLIN HFA) 108 (90 BASE) MCG/ACT inhaler Inhale 2 puffs into the lungs every 6 (six) hours as needed for wheezing or shortness of breath.   Yes Historical Provider, MD  ALPRAZolam (XANAX) 0.25 MG tablet Take 1 tablet (0.25 mg total) by mouth at bedtime as needed for sleep. 12/04/12  Yes Simonne Martinet, NP  amLODipine (NORVASC) 10 MG tablet Take 5 mg by mouth daily.  12/15/10  Yes Historical Provider, MD  aspirin 81 MG tablet Take 81 mg by mouth daily.   Yes Historical Provider,  MD  docusate sodium (COLACE) 100 MG capsule Take 200 mg by mouth at bedtime as needed for constipation.    Yes Historical Provider, MD  Fluticasone Furoate-Vilanterol 100-25 MCG/INH AEPB Inhale 1 puff into the lungs daily. 01/24/14  Yes Oretha Milch, MD  furosemide (LASIX) 20 MG tablet Take 1 tablet by mouth daily.  02/01/11  Yes Historical Provider, MD  levofloxacin (LEVAQUIN) 500 MG tablet Take 1 tablet by mouth daily. Starting 01/23/2014 x 10 days. 01/23/14  Yes Historical Provider, MD  Omega-3 Fatty Acids (EQL FISH OIL) 1000 MG CAPS Take 2 capsules (2,000 mg total) by mouth daily. 01/13/11  Yes Kathlen Brunswick, MD  omeprazole (PRILOSEC) 20 MG capsule Take 20 mg by mouth daily. 30 minutes before breakfast   Yes Historical Provider, MD  pravastatin (PRAVACHOL) 80 MG tablet Take 80 mg by mouth at bedtime.   Yes Historical Provider, MD  tiotropium (SPIRIVA) 18 MCG inhalation capsule Place 1 capsule (18 mcg total) into inhaler and inhale daily. 01/24/14  Yes Oretha Milch, MD   Physical Exam: Filed Vitals:   01/28/14 1500  BP:   Pulse: 98  Temp:   Resp: 30    BP 131/74  Pulse 98  Temp(Src) 97.8 F (36.6 C) (Oral)  Resp 30  Ht 5\' 9"  (1.753 m)  Wt 59.875 kg (132 lb)  BMI 19.48 kg/m2  SpO2 95%  General:  Appears uncomfortable. He does not have peripheral or central cyanosis. There is somewhat increased work of breathing at rest. There is no clubbing. Is not pale. There is no jaundice. Eyes: PERRL, normal lids, irises & conjunctiva ENT: grossly normal hearing, lips & tongue Neck: no LAD, masses or thyromegaly Cardiovascular: RRR, no m/r/g. No LE edema. Telemetry: SR, no arrhythmias  Respiratory: Bilateral wheezing with a tight chest. There are no crackles. There is no bronchial breathing. Abdomen: soft, ntnd Skin: no rash or induration seen on limited exam Musculoskeletal: grossly normal tone BUE/BLE Psychiatric: grossly normal mood and affect, speech fluent and appropriate Neurologic:  grossly non-focal.          Labs on Admission:  Basic Metabolic Panel:  Recent Labs Lab 01/28/14 1340  NA 140  K 3.5*  CL 99  CO2 28  GLUCOSE 112*  BUN 13  CREATININE 0.53  CALCIUM 8.6       CBC:  Recent Labs Lab 01/28/14 1340  WBC 18.9*  NEUTROABS 16.6*  HGB 15.1  HCT 45.1  MCV 97.0  PLT 437*      BNP (last 3 results)  Recent Labs  01/28/13 1643  PROBNP 469.3*   :   Radiological Exams on Admission: Dg Chest 2 View  01/28/2014   CLINICAL DATA:  Shortness of breath and cough  EXAM: CHEST  2 VIEW  COMPARISON:  November 07, 2013  FINDINGS: There is underlying emphysematous change. There is scarring in the right mid lung and in both  bases. There is no edema or consolidation.  The heart size is normal. Pulmonary vascularity reflects underlying emphysema. Aorta is tortuous with atherosclerotic change. Patient is status post median sternotomy. No bone lesions.  IMPRESSION: Underlying emphysematous change.  No edema or consolidation.   Electronically Signed   By: Bretta BangWilliam  Woodruff M.D.   On: 01/28/2014 13:06      Assessment/Plan Active Problems:   COPD exacerbation   HYPERTENSION   Coronary artery disease   1. COPD exacerbation. 2. Hypertension. 3. Coronary artery disease, stable clinically. 4. Acute grief secondary to death of his wife. This may explain his anorexia.  Plan: 1. Admit to medical floor. 2. Intravenous steroids. 3. Antibiotics empirically. 4. Bronchodilators.  Other recommendations will depend on patient's hospital progress.   Code Status: Full code.  Family Communication: Discussed plan with patient and patient's son at the bedside.   Disposition Plan: Home when medically stable.   Time spent: 45 minutes.  Wilson SingerNimish C Miesha Bachmann [facility] Pager [phone].

## 2014-01-29 DIAGNOSIS — J9620 Acute and chronic respiratory failure, unspecified whether with hypoxia or hypercapnia: Secondary | ICD-10-CM | POA: Diagnosis not present

## 2014-01-29 DIAGNOSIS — E43 Unspecified severe protein-calorie malnutrition: Secondary | ICD-10-CM | POA: Diagnosis not present

## 2014-01-29 DIAGNOSIS — I10 Essential (primary) hypertension: Secondary | ICD-10-CM | POA: Diagnosis not present

## 2014-01-29 DIAGNOSIS — J441 Chronic obstructive pulmonary disease with (acute) exacerbation: Secondary | ICD-10-CM | POA: Diagnosis not present

## 2014-01-29 LAB — CBC
HCT: 40.1 % (ref 39.0–52.0)
HEMOGLOBIN: 13.6 g/dL (ref 13.0–17.0)
MCH: 32.9 pg (ref 26.0–34.0)
MCHC: 33.9 g/dL (ref 30.0–36.0)
MCV: 96.9 fL (ref 78.0–100.0)
PLATELETS: 389 10*3/uL (ref 150–400)
RBC: 4.14 MIL/uL — ABNORMAL LOW (ref 4.22–5.81)
RDW: 12.8 % (ref 11.5–15.5)
WBC: 9 10*3/uL (ref 4.0–10.5)

## 2014-01-29 LAB — COMPREHENSIVE METABOLIC PANEL
ALT: 22 U/L (ref 0–53)
AST: 19 U/L (ref 0–37)
Albumin: 2.8 g/dL — ABNORMAL LOW (ref 3.5–5.2)
Alkaline Phosphatase: 44 U/L (ref 39–117)
BUN: 11 mg/dL (ref 6–23)
CALCIUM: 8.4 mg/dL (ref 8.4–10.5)
CO2: 28 meq/L (ref 19–32)
Chloride: 101 mEq/L (ref 96–112)
Creatinine, Ser: 0.46 mg/dL — ABNORMAL LOW (ref 0.50–1.35)
Glucose, Bld: 139 mg/dL — ABNORMAL HIGH (ref 70–99)
Potassium: 3.4 mEq/L — ABNORMAL LOW (ref 3.7–5.3)
SODIUM: 141 meq/L (ref 137–147)
Total Bilirubin: 0.4 mg/dL (ref 0.3–1.2)
Total Protein: 6.2 g/dL (ref 6.0–8.3)

## 2014-01-29 MED ORDER — POLYETHYLENE GLYCOL 3350 17 G PO PACK
17.0000 g | PACK | Freq: Every day | ORAL | Status: DC
  Administered 2014-01-29 – 2014-02-01 (×4): 17 g via ORAL
  Filled 2014-01-29 (×4): qty 1

## 2014-01-29 MED ORDER — BOOST / RESOURCE BREEZE PO LIQD
1.0000 | Freq: Three times a day (TID) | ORAL | Status: DC
Start: 2014-01-29 — End: 2014-02-06
  Administered 2014-01-29 – 2014-02-06 (×24): 1 via ORAL

## 2014-01-29 MED ORDER — ATORVASTATIN CALCIUM 20 MG PO TABS
20.0000 mg | ORAL_TABLET | Freq: Every day | ORAL | Status: DC
  Administered 2014-01-29 – 2014-02-05 (×8): 20 mg via ORAL
  Filled 2014-01-29 (×8): qty 1

## 2014-01-29 NOTE — Care Management Utilization Note (Signed)
UR completed 

## 2014-01-29 NOTE — Care Management Note (Addendum)
    Page 1 of 2   02/05/2014     12:21:04 PM CARE MANAGEMENT NOTE 02/05/2014  Patient:  [name],[name]   Account Number:  [id number]  Date Initiated:  01/29/2014  Documentation initiated by:  Rosemary HolmsOBSON,Rolanda Campa  Subjective/Objective Assessment:   Pt states he lives at home and her child is currently staying with him. [facility]. States he had HH PT and aide.     Action/Plan:   Anticipated DC Date:  02/05/2014   Anticipated DC Plan:  HOME W HOME HEALTH SERVICES      DC Planning Services  CM consult      Choice offered to / List presented to:          [facility]  [facility] RN  HH-2 PT  HH-4 NURSE'S AIDE      HH agency  [facility]   Status of service:  Completed, signed off Medicare Important Message given?   (If response is "NO", the following Medicare IM given date fields will be blank) Date Medicare IM given:   Date Additional Medicare IM given:    Discharge Disposition:    Per UR Regulation:    If discussed at Long Length of Stay Meetings, dates discussed:   02/05/2014    Comments:  02/05/14 Rosemary HolmsAmy Hudsyn Barich RN BSN CM Pt evaluated by PT who is recommending SNF. MD and CSW aware.  02/04/14 Rosemary HolmsAmy Jalissa Heinzelman RN bSN CM Pt up in chair. Per RN, O2 stats dropped to 80% transfering from bed to chair. RN requested to get O2 sats per Mediccare guidelines for documentation for continious O2. [facility]  02/01/14 Reggie Welge Leanord Hawkingobson RN BSN CM Tamala BariMary Manley aware of possible weekend DC. Staff alerted to call her cell [phone]. Call if DC'd  01/29/14 Joleigh Mineau Leanord Hawkingobson RN BSN CM

## 2014-01-29 NOTE — Progress Notes (Signed)
INITIAL NUTRITION ASSESSMENT  DOCUMENTATION CODES Per approved criteria  -Severe malnutrition in the context of acute illness or injury   Pt meets criteria for severe MALNUTRITION in the context of acute illness as evidenced by <50% estimated nutritional needs x 5 days, 13.9% wt loss x 1 month.  INTERVENTION: Resource Breeze po TID, each supplement provides 250 kcal and 9 grams of protein  NUTRITION DIAGNOSIS: Inadequate oral intake related to decreased appetite as evidenced by PO: 50%.   Goal: Pt will meet >90% of estimated nutritional needs  Monitor:  PO intake, labs, skin assessments, weight changes, I/O's  Reason for Assessment: low BMI  78 y.o. male  Admitting Dx: <principal problem not specified>  ASSESSMENT: Pt slightly agitated at time of visit and with multiple complaints. He reports that he has not had a BM in the past 3-4 days, but "nothing does anything". He reports his home regimen in Miralax daily. Due to constipation, he reports early satiety.  He reveals he lost his wife to cancer last week and "wasn't taking care of myself as I should" due to caring for her ("she wouldn't let me leave"). He disclosed that he was noncompliant with his medications and was not eating during this time, due to caring for his wife.  He denies any chewing or swallowing issues. He confirms weight loss, reporting UBW between 130-140#. Wt hx reveals a 19# (13.6%) wt loss x 1 month, which is clinically significant.  He is agreeable to a nutritional supplement, but refuses Boost and Ensure. He requests the supplement with the least amount of potassium and sodium ("that potassium will mess you up and I want no part of that").   Height: Ht Readings from Last 1 Encounters:  01/28/14 5\' 8"  (1.727 m)    Weight: Wt Readings from Last 1 Encounters:  01/28/14 120 lb (54.432 kg)    Ideal Body Weight: 154#  % Ideal Body Weight: 78%  Wt Readings from Last 10 Encounters:  01/28/14 120 lb  (54.432 kg)  12/20/13 139 lb 3.2 oz (63.141 kg)  11/12/13 138 lb (62.596 kg)  11/07/13 139 lb 12.8 oz (63.413 kg)  10/02/13 146 lb 9.6 oz (66.497 kg)  07/05/13 142 lb 12.8 oz (64.774 kg)  03/13/13 145 lb (65.772 kg)  02/06/13 141 lb 8 oz (64.184 kg)  01/28/13 139 lb (63.05 kg)  01/08/13 145 lb (65.772 kg)    Usual Body Weight: 140#  % Usual Body Weight: 85%  BMI:  Body mass index is 18.25 kg/(m^2). Meets criteria for underweight.   Estimated Nutritional Needs: Kcal: 1600-1800 daily Protein: 55-66 grams daily Fluid: 1.6-1.8 L daily  Skin: WDL  Diet Order: Cardiac  EDUCATION NEEDS: -Education needs addressed   Intake/Output Summary (Last 24 hours) at 01/29/14 1344 Last data filed at 01/29/14 0956  Gross per 24 hour  Intake    240 ml  Output   1425 ml  Net  -1185 ml    Last BM: 01/25/14  Labs:   Recent Labs Lab 01/28/14 1340 01/29/14 0516  NA 140 141  K 3.5* 3.4*  CL 99 101  CO2 28 28  BUN 13 11  CREATININE 0.53 0.46*  CALCIUM 8.6 8.4  GLUCOSE 112* 139*    CBG (last 3)  No results found for this basename: GLUCAP,  in the last 72 hours  Scheduled Meds: . amLODipine  5 mg Oral Daily  . aspirin EC  81 mg Oral Daily  . azithromycin  500 mg Intravenous Q24H  .  cefTRIAXone (ROCEPHIN)  IV  1 g Intravenous Q24H  . Fluticasone Furoate-Vilanterol  1 puff Inhalation Daily  . furosemide  20 mg Oral Daily  . heparin  5,000 Units Subcutaneous 3 times per day  . ipratropium-albuterol  3 mL Nebulization QID  . methylPREDNISolone (SOLU-MEDROL) injection  125 mg Intravenous Q6H  . omega-3 acid ethyl esters  2 capsule Oral Daily  . pantoprazole  40 mg Oral Daily  . simvastatin  40 mg Oral q1800  . tiotropium  18 mcg Inhalation Daily    Continuous Infusions: . sodium chloride 50 mL/hr at 01/28/14 1806    Past Medical History  Diagnosis Date  . Hypertension   . Hyperlipidemia     excellent control  . Chronic kidney disease, stage III (moderate)   .  Barrett's esophagus     frequent surveillance EGDs by Dr. Jena Gaussourk  . Chronic obstructive pulmonary disease     severe emphysema  . Cough, persistent   . Hyperkalemia     peak of 6.2 in 2009  . Colonic polyp     resected 2007 via colonoscope  . Diverticulosis   . Pneumonia     10/2010  . Coronary artery disease     CABG in 03/2008 for severe left main disease with critical RCA stenosis; EF of 45-50% with inferior wall hypokinesis; echocardiogram in 10/2008-no change in LV function; stress nuclear-conflicting information in report indicating inferior reversibility but inferior scar  . Gastroesophageal reflux disease   . Peptic ulcer disease     [facility]; treated for positive Helicobacter pylori  . History of orthostatic hypotension     noted following CABG surgery  . COPD (chronic obstructive pulmonary disease)     Past Surgical History  Procedure Laterality Date  . Laparoscopic gastrotomy w/ repair of ulcer      1999  . Coronary artery bypass graft  03/2008  . Colon surgery    . Cardiac surgery      Emorie Mcfate A. Mayford KnifeWilliams, RD, LDN Pager: [phone]

## 2014-01-29 NOTE — Progress Notes (Signed)
[facility] PROGRESS NOTE  Joseph CraftWayne J [name] [name] DOB: 03/29/1936 DOA: 01/28/2014 PCP: Cassell SmilesFUSCO,LAWRENCE J., MD  Assessment/Plan: 1. Acute on chronic respiratory failure. Patient usually uses oxygen only at night. Continue to wean down oxygen as tolerated. He may need to be discharged home oxygen. 2. COPD exacerbation. Continue current treatment with intravenous steroids, antibiotics and bronchodilators. Patient appears to have fairly advanced COPD, so his progress may be slow. 3. Hypertension. Continue outpatient regimen 4. Coronary artery disease. Stable. No chest pain  Code Status: full code Family Communication: discussed with patient, no family present Disposition Plan: discharge home once improved   Consultants:    Procedures:    Antibiotics:  Rocephin 4/13>>  Azithromycin 4/13  HPI/Subjective: Still feels very short of breath, coughing  Objective: Filed Vitals:   01/29/14 1413  BP: 132/75  Pulse: 101  Temp: 97.9 F (36.6 C)  Resp: 18    Intake/Output Summary (Last 24 hours) at 01/29/14 2027 Last data filed at 01/29/14 2000  Gross per 24 hour  Intake   1280 ml  Output   2775 ml  Net  -1495 ml   Filed Weights   01/28/14 1226 01/28/14 1816  Weight: 59.875 kg (132 lb) 54.432 kg (120 lb)    Exam:   General:  NAD, appears to be chronically ill  Cardiovascular: S1, S2 RRR  Respiratory: diminished breath sounds bilaterally with mild exp wheezes  Abdomen: soft, nt, nd, bs+  Musculoskeletal: no edema b/l   Data Reviewed: Basic Metabolic Panel:  Recent Labs Lab 01/28/14 1340 01/29/14 0516  NA 140 141  K 3.5* 3.4*  CL 99 101  CO2 28 28  GLUCOSE 112* 139*  BUN 13 11  CREATININE 0.53 0.46*  CALCIUM 8.6 8.4   Liver Function Tests:  Recent Labs Lab 01/29/14 0516  AST 19  ALT 22  ALKPHOS 44  BILITOT 0.4  PROT 6.2  ALBUMIN 2.8*   No results found for this basename: LIPASE, AMYLASE,  in the last 168 hours No results  found for this basename: AMMONIA,  in the last 168 hours CBC:  Recent Labs Lab 01/28/14 1340 01/29/14 0516  WBC 18.9* 9.0  NEUTROABS 16.6*  --   HGB 15.1 13.6  HCT 45.1 40.1  MCV 97.0 96.9  PLT 437* 389   Cardiac Enzymes: No results found for this basename: CKTOTAL, CKMB, CKMBINDEX, TROPONINI,  in the last 168 hours BNP (last 3 results) No results found for this basename: PROBNP,  in the last 8760 hours CBG: No results found for this basename: GLUCAP,  in the last 168 hours  No results found for this or any previous visit (from the past 240 hour(s)).   Studies: Dg Chest 2 View  01/28/2014   CLINICAL DATA:  Shortness of breath and cough  EXAM: CHEST  2 VIEW  COMPARISON:  November 07, 2013  FINDINGS: There is underlying emphysematous change. There is scarring in the right mid lung and in both bases. There is no edema or consolidation.  The heart size is normal. Pulmonary vascularity reflects underlying emphysema. Aorta is tortuous with atherosclerotic change. Patient is status post median sternotomy. No bone lesions.  IMPRESSION: Underlying emphysematous change.  No edema or consolidation.   Electronically Signed   By: Bretta BangWilliam  Woodruff M.D.   On: 01/28/2014 13:06    Scheduled Meds: . amLODipine  5 mg Oral Daily  . aspirin EC  81 mg Oral Daily  . atorvastatin  20 mg Oral q1800  . azithromycin  500 mg Intravenous Q24H  . cefTRIAXone (ROCEPHIN)  IV  1 g Intravenous Q24H  . feeding supplement (RESOURCE BREEZE)  1 Container Oral TID BM  . Fluticasone Furoate-Vilanterol  1 puff Inhalation Daily  . furosemide  20 mg Oral Daily  . heparin  5,000 Units Subcutaneous 3 times per day  . ipratropium-albuterol  3 mL Nebulization QID  . methylPREDNISolone (SOLU-MEDROL) injection  125 mg Intravenous Q6H  . omega-3 acid ethyl esters  2 capsule Oral Daily  . pantoprazole  40 mg Oral Daily  . polyethylene glycol  17 g Oral Daily  . tiotropium  18 mcg Inhalation Daily   Continuous  Infusions: . sodium chloride 50 mL/hr at 01/28/14 1806    Active Problems:   HYPERTENSION   Coronary artery disease   COPD exacerbation   Protein-calorie malnutrition, severe    Time spent: 25mins    [facility]night[facility] Medical CenterJehanzeb Memon  [facility] Pager [phone]. If 7PM-7AM, please contact night-coverage at [email or web address], password [email or web address] 01/29/2014, 8:27 PM  LOS: 1 day

## 2014-01-30 DIAGNOSIS — J441 Chronic obstructive pulmonary disease with (acute) exacerbation: Secondary | ICD-10-CM | POA: Diagnosis not present

## 2014-01-30 DIAGNOSIS — J9620 Acute and chronic respiratory failure, unspecified whether with hypoxia or hypercapnia: Secondary | ICD-10-CM | POA: Diagnosis not present

## 2014-01-30 LAB — CBC
HCT: 40.1 % (ref 39.0–52.0)
Hemoglobin: 13.3 g/dL (ref 13.0–17.0)
MCH: 32.3 pg (ref 26.0–34.0)
MCHC: 33.2 g/dL (ref 30.0–36.0)
MCV: 97.3 fL (ref 78.0–100.0)
PLATELETS: 444 10*3/uL — AB (ref 150–400)
RBC: 4.12 MIL/uL — AB (ref 4.22–5.81)
RDW: 12.8 % (ref 11.5–15.5)
WBC: 18.6 10*3/uL — AB (ref 4.0–10.5)

## 2014-01-30 LAB — BASIC METABOLIC PANEL
BUN: 15 mg/dL (ref 6–23)
CHLORIDE: 103 meq/L (ref 96–112)
CO2: 31 meq/L (ref 19–32)
Calcium: 8.4 mg/dL (ref 8.4–10.5)
Creatinine, Ser: 0.48 mg/dL — ABNORMAL LOW (ref 0.50–1.35)
GFR calc Af Amer: >90 mL/min (ref >90)
GFR calc non Af Amer: >90 mL/min (ref >90)
Glucose, Bld: 144 mg/dL — ABNORMAL HIGH (ref 70–99)
POTASSIUM: 3.2 meq/L — AB (ref 3.7–5.3)
SODIUM: 144 meq/L (ref 137–147)

## 2014-01-30 MED ORDER — POTASSIUM CHLORIDE CRYS ER 20 MEQ PO TBCR
40.0000 meq | EXTENDED_RELEASE_TABLET | Freq: Once | ORAL | Status: AC
  Administered 2014-01-30: 40 meq via ORAL
  Filled 2014-01-30: qty 2

## 2014-01-30 NOTE — Progress Notes (Signed)
  PROGRESS NOTE  Mariel CraftWayne J Noguera ZOX:[id number] DOB: 01/05/1936 DOA: 01/28/2014 PCP: Cassell SmilesFUSCO,LAWRENCE J., MD  Assessment/Plan: 1. Acute on chronic respiratory failure, uses nocturnal oxygen at home. Somewhat improved with decreased oxygen requirement. Suspect may need home oxygen. 2. COPD exacerbation. Appears stable but no significant improvement overnight. Continue current management. Expect progress to be slow. 3. Hypertension. Stable.   Continue supplemental oxygen, bronchodilators, antibiotics, steroids without change today.  Code Status: full code DVT prophylaxis: heparin Family Communication: son and other family Disposition Plan: home   Brendia Sacksaniel Goodrich, MD  [facility]  Pager [phone] If 7PM-7AM, please contact night-coverage at [email or web address], password [email or web address] 01/30/2014, 4:23 PM  LOS: 2 days   Consultants:    Procedures:    Antibiotics:  Rocephin 4/13>>  Azithromycin 4/13  HPI/Subjective: Feels about the same today. Still short of breath.  Objective: Filed Vitals:   01/30/14 0712 01/30/14 1139 01/30/14 1447 01/30/14 1527  BP:   109/57   Pulse:   96   Temp:   98 F (36.7 C)   TempSrc:      Resp:   18   Height:      Weight:      SpO2: 94% 95% 97% 96%    Intake/Output Summary (Last 24 hours) at 01/30/14 1623 Last data filed at 01/30/14 1200  Gross per 24 hour  Intake   2425 ml  Output   1650 ml  Net    775 ml     Filed Weights   01/28/14 1226 01/28/14 1816  Weight: 59.875 kg (132 lb) 54.432 kg (120 lb)    Exam:   Afebrile, vital signs are stable. Hypoxia somewhat improved, currently on 3 L.  Gen. Appears calm, comfortable. Appears short of breath but not toxic.  Cardiovascular regular rate and rhythm. No murmur, rub or gallop. No significant lower extremity edema.  Respiratory poor air movement. Pursed lip breathing. Moderate increased respiratory effort. Able to speak in full sentences. No frank rhonchi or rales. Mild  wheezing.  Psychiatric grossly normal mood and affect. Speech fluent and appropriate  Data Reviewed:  Potassium 3.2. Basic metabolic panel otherwise unremarkable.  CBC noted. Leukocytosis likely secondary to steroids. BPC 18.6.  Scheduled Meds: . amLODipine  5 mg Oral Daily  . aspirin EC  81 mg Oral Daily  . atorvastatin  20 mg Oral q1800  . azithromycin  500 mg Intravenous Q24H  . cefTRIAXone (ROCEPHIN)  IV  1 g Intravenous Q24H  . feeding supplement (RESOURCE BREEZE)  1 Container Oral TID BM  . Fluticasone Furoate-Vilanterol  1 puff Inhalation Daily  . furosemide  20 mg Oral Daily  . heparin  5,000 Units Subcutaneous 3 times per day  . ipratropium-albuterol  3 mL Nebulization QID  . methylPREDNISolone (SOLU-MEDROL) injection  125 mg Intravenous Q6H  . omega-3 acid ethyl esters  2 capsule Oral Daily  . pantoprazole  40 mg Oral Daily  . polyethylene glycol  17 g Oral Daily  . tiotropium  18 mcg Inhalation Daily   Continuous Infusions: . sodium chloride 50 mL/hr at 01/30/14 1126    Active Problems:   HYPERTENSION   Coronary artery disease   COPD exacerbation   Protein-calorie malnutrition, severe   Acute-on-chronic respiratory failure   Time spent 20 minutes

## 2014-01-31 DIAGNOSIS — E43 Unspecified severe protein-calorie malnutrition: Secondary | ICD-10-CM | POA: Diagnosis not present

## 2014-01-31 DIAGNOSIS — J441 Chronic obstructive pulmonary disease with (acute) exacerbation: Secondary | ICD-10-CM | POA: Diagnosis not present

## 2014-01-31 DIAGNOSIS — J9620 Acute and chronic respiratory failure, unspecified whether with hypoxia or hypercapnia: Secondary | ICD-10-CM | POA: Diagnosis not present

## 2014-01-31 LAB — BASIC METABOLIC PANEL
BUN: 11 mg/dL (ref 6–23)
CHLORIDE: 100 meq/L (ref 96–112)
CO2: 32 meq/L (ref 19–32)
Calcium: 8.2 mg/dL — ABNORMAL LOW (ref 8.4–10.5)
Creatinine, Ser: 0.43 mg/dL — ABNORMAL LOW (ref 0.50–1.35)
GFR calc Af Amer: >90 mL/min (ref >90)
GFR calc non Af Amer: >90 mL/min (ref >90)
Glucose, Bld: 137 mg/dL — ABNORMAL HIGH (ref 70–99)
POTASSIUM: 3.4 meq/L — AB (ref 3.7–5.3)
Sodium: 142 mEq/L (ref 137–147)

## 2014-01-31 LAB — MAGNESIUM: MAGNESIUM: 2.2 mg/dL (ref 1.5–2.5)

## 2014-01-31 MED ORDER — GUAIFENESIN 100 MG/5ML PO SOLN
200.0000 mg | ORAL | Status: DC | PRN
  Administered 2014-01-31 – 2014-02-04 (×6): 200 mg via ORAL
  Filled 2014-01-31: qty 5
  Filled 2014-01-31: qty 10
  Filled 2014-01-31 (×6): qty 5

## 2014-01-31 MED ORDER — POTASSIUM CHLORIDE CRYS ER 20 MEQ PO TBCR
40.0000 meq | EXTENDED_RELEASE_TABLET | Freq: Once | ORAL | Status: AC
  Administered 2014-01-31: 40 meq via ORAL
  Filled 2014-01-31: qty 2

## 2014-01-31 MED ORDER — METHYLPREDNISOLONE SODIUM SUCC 125 MG IJ SOLR
60.0000 mg | Freq: Four times a day (QID) | INTRAMUSCULAR | Status: DC
  Administered 2014-01-31 – 2014-02-03 (×11): 60 mg via INTRAVENOUS
  Filled 2014-01-31 (×11): qty 2

## 2014-01-31 NOTE — Progress Notes (Signed)
PROGRESS NOTE  Joseph CraftWayne J [name] [id number] DOB: 11/05/1935 DOA: 01/28/2014 PCP: Cassell SmilesFUSCO,LAWRENCE J., MD Pulmonologist- Dr. Vassie LollAlva   Summary: 78 year old man with history of COPD, nocturnal oxygen use, ex-smoker presented with several day history of increasing cough and dyspnea. Of note, his wife died one week prior to admission. Admitted for COPD exacerbation.  Assessment/Plan: 1. Acute on chronic respiratory failure, uses nocturnal oxygen at home 3L. slow improvement. 2. COPD exacerbation. Somewhat better today although will be very slow to improve . Maintained at home on albuterol, fluticasone/vilanterol/Spiriva at home. 3. Severe COPD. [place]. [place] Treated with doxycycline and Mucinex 30/2015. PFTS in past showed severe obstruction.  4. Hypertension. Stable.  5. Chronic kidney disease stage III. 6. H/o CABG 2009 7. History of tobacco dependence, quit in 1999.  8. Social. Wife died last week. 9. Severe malnutrition in context of acute illness. Resource Breeze po TID,   Continue supplemental oxygen, bronchodilators, antibiotics.  Decrease steroids stay.  Bowel regimen   Replete magnesium. BMP in the morning.  Code Status: full code DVT prophylaxis: heparin Family Communication:  [facility]  Brendia Sacksaniel Goodrich, MD  [facility]  Pager [phone] If 7PM-7AM, please contact night-coverage at [email or web address], password [email or web address] 01/31/2014, 10:29 AM  LOS: 3 days   Consultants:    Procedures:    Antibiotics:  Rocephin 4/13>>  Azithromycin 4/13 >>   HPI/Subjective: Reports constipation. Breathing better, still coughing. Has had right groin swelling with activity for several months. Non-tender, non-painful now.  Objective: Filed Vitals:   01/30/14 1931 01/30/14 2000 01/31/14 0358 01/31/14 0653  BP:  119/58 132/73   Pulse:  97 88   Temp:  97.7 F (36.5 C) 97.5 F (36.4 C)   TempSrc:  Oral Oral   Resp:  18 18     Height:      Weight:      SpO2: 95% 95% 96% 95%    Intake/Output Summary (Last 24 hours) at 01/31/14 1029 Last data filed at 01/31/14 0355  Gross per 24 hour  Intake    480 ml  Output   1750 ml  Net  -1270 ml     Filed Weights   01/28/14 1226 01/28/14 1816  Weight: 59.875 kg (132 lb) 54.432 kg (120 lb)    Exam:   Afebrile, vital signs are stable. Hypoxia , stable, remains on 3 L.  Gen. Appears calm, nontoxic. Sitting in chair.  Cardiovascular regular rate and rhythm. No murmur, rub gallop. No lower extremity edema.  Respiratory fair air movement, Some wheezes, no rhonchi or rales. Moderate increased respiratory effort however improved today over yesterday.  Right inguinal hernia is nontender to palpation.  Data Reviewed:  Potassium 3.4. Magnesium normal. Basic metabolic panel otherwise unremarkable.  Scheduled Meds: . amLODipine  5 mg Oral Daily  . aspirin EC  81 mg Oral Daily  . atorvastatin  20 mg Oral q1800  . azithromycin  500 mg Intravenous Q24H  . cefTRIAXone (ROCEPHIN)  IV  1 g Intravenous Q24H  . feeding supplement (RESOURCE BREEZE)  1 Container Oral TID BM  . furosemide  20 mg Oral Daily  . heparin  5,000 Units Subcutaneous 3 times per day  . ipratropium-albuterol  3 mL Nebulization QID  . methylPREDNISolone (SOLU-MEDROL) injection  125 mg Intravenous Q6H  . omega-3 acid ethyl esters  2 capsule Oral Daily  . pantoprazole  40 mg Oral Daily  . polyethylene glycol  17 g Oral Daily  .  tiotropium  18 mcg Inhalation Daily   Continuous Infusions:    Active Problems:   HYPERTENSION   Coronary artery disease   COPD exacerbation   Protein-calorie malnutrition, severe   Acute-on-chronic respiratory failure   Time spent 20 minutes

## 2014-01-31 NOTE — Care Management Note (Signed)
[facility]. Resumption of Care orders requested upon discharge.

## 2014-02-01 DIAGNOSIS — E43 Unspecified severe protein-calorie malnutrition: Secondary | ICD-10-CM | POA: Diagnosis not present

## 2014-02-01 DIAGNOSIS — J9620 Acute and chronic respiratory failure, unspecified whether with hypoxia or hypercapnia: Secondary | ICD-10-CM | POA: Diagnosis not present

## 2014-02-01 DIAGNOSIS — J441 Chronic obstructive pulmonary disease with (acute) exacerbation: Secondary | ICD-10-CM | POA: Diagnosis not present

## 2014-02-01 LAB — BASIC METABOLIC PANEL
BUN: 13 mg/dL (ref 6–23)
CO2: 31 mEq/L (ref 19–32)
Calcium: 8.4 mg/dL (ref 8.4–10.5)
Chloride: 99 mEq/L (ref 96–112)
Creatinine, Ser: 0.39 mg/dL — ABNORMAL LOW (ref 0.50–1.35)
GFR calc Af Amer: >90 mL/min (ref >90)
GFR calc non Af Amer: >90 mL/min (ref >90)
GLUCOSE: 117 mg/dL — AB (ref 70–99)
Potassium: 3.6 mEq/L — ABNORMAL LOW (ref 3.7–5.3)
SODIUM: 140 meq/L (ref 137–147)

## 2014-02-01 LAB — MAGNESIUM: MAGNESIUM: 2.3 mg/dL (ref 1.5–2.5)

## 2014-02-01 MED ORDER — SENNA 8.6 MG PO TABS
1.0000 | ORAL_TABLET | Freq: Every day | ORAL | Status: DC
  Administered 2014-02-01 – 2014-02-05 (×5): 8.6 mg via ORAL
  Filled 2014-02-01 (×5): qty 1

## 2014-02-01 MED ORDER — DOCUSATE SODIUM 100 MG PO CAPS
100.0000 mg | ORAL_CAPSULE | Freq: Two times a day (BID) | ORAL | Status: DC
  Administered 2014-02-01 – 2014-02-06 (×10): 100 mg via ORAL
  Filled 2014-02-01 (×10): qty 1

## 2014-02-01 MED ORDER — POLYETHYLENE GLYCOL 3350 17 G PO PACK
17.0000 g | PACK | Freq: Two times a day (BID) | ORAL | Status: DC
  Administered 2014-02-01 – 2014-02-06 (×10): 17 g via ORAL
  Filled 2014-02-01 (×10): qty 1

## 2014-02-01 MED ORDER — AZITHROMYCIN 250 MG PO TABS
500.0000 mg | ORAL_TABLET | Freq: Every day | ORAL | Status: DC
  Administered 2014-02-01 – 2014-02-03 (×3): 500 mg via ORAL
  Filled 2014-02-01 (×3): qty 2

## 2014-02-01 MED ORDER — POTASSIUM CHLORIDE CRYS ER 20 MEQ PO TBCR
40.0000 meq | EXTENDED_RELEASE_TABLET | Freq: Two times a day (BID) | ORAL | Status: AC
  Administered 2014-02-01 (×2): 40 meq via ORAL
  Filled 2014-02-01 (×2): qty 2

## 2014-02-01 NOTE — Progress Notes (Signed)
PROGRESS NOTE  [name] [id number] DOB: 11/09/1935 DOA: 01/28/2014 PCP: Cassell SmilesFUSCO,LAWRENCE J., MD Pulmonologist- Dr. Vassie LollAlva   Summary: 78 year old man with history of COPD, nocturnal oxygen use, ex-smoker presented with several day history of increasing cough and dyspnea. Of note, his wife died one week prior to admission. Admitted for COPD exacerbation.  Assessment/Plan: 1. Acute on chronic respiratory failure, uses nocturnal oxygen at home 3L.  2. COPD exacerbation. A little worse today. Maintained at home on albuterol, fluticasone/vilanterol/Spiriva at home. 3. Severe COPD. Follow[place] Dr. [place]ie [place]. Treated with doxycycline and Mucinex 30/2015. PFTS in past showed severe obstruction.  4. Hypertension. Well controlled. 5. H/o CABG 2009 6. History of tobacco dependence, quit in 1999.  7. Social. Wife died last week. 8. Severe malnutrition in context of acute illness. Resource Breeze po TID.   Overall somewhat worse today although respiratory status and hypoxia stable. Continue supplemental oxygen, bronchodilators, antibiotics, steroids. Suspect improvement will be very slow.  Aggressive bowel regimen   Replete potassium.   Code Status: full code DVT prophylaxis: heparin Family Communication:  [facility]  Brendia Sacksaniel Carles Florea, MD  [facility]  Pager [phone] If 7PM-7AM, please contact night-coverage at [email or web address], password [email or web address] 02/01/2014, 12:00 PM  LOS: 4 days   Consultants:    Procedures:    Antibiotics:  Rocephin 4/13>>  Azithromycin 4/13 >>   HPI/Subjective: He feels a little worse today. Breathing about the same. Some cough and wheezing. Hernia without pain.  Objective: Filed Vitals:   02/01/14 0435 02/01/14 0802 02/01/14 0919 02/01/14 1126  BP: 124/87  122/82   Pulse: 91     Temp: 97.5 F (36.4 C)     TempSrc: Oral     Resp: 18     Height:      Weight:      SpO2: 99% 94%  92%    Intake/Output  Summary (Last 24 hours) at 02/01/14 1200 Last data filed at 02/01/14 1154  Gross per 24 hour  Intake    360 ml  Output   1800 ml  Net  -1440 ml     Filed Weights   01/28/14 1226 01/28/14 1816  Weight: 59.875 kg (132 lb) 54.432 kg (120 lb)    Exam:   Afebrile, vital signs are stable. Hypoxia , stable, remains on 3 L.  Gen. Appears calm and mildly uncomfortable. Nontoxic.  Cardiovascular. Regular rate and rhythm. No murmur, rub or gallop.  Respiratory. Fair air movement. No frank rhonchi or rales. Some wheezing. Moderate increased respiratory effort. Able to speak in full senses.  Abdomen soft nontender and nondistended. Right inguinal hernia without pain.  Skin appears grossly unremarkable.  Psychiatric grossly normal mood and affect. Speech fluent and appropriate.  Data Reviewed:  Potassium 3.6. Magnesium 2.3. Basic metabolic panel otherwise unremarkable.  Scheduled Meds: . amLODipine  5 mg Oral Daily  . aspirin EC  81 mg Oral Daily  . atorvastatin  20 mg Oral q1800  . azithromycin  500 mg Oral Daily  . cefTRIAXone (ROCEPHIN)  IV  1 g Intravenous Q24H  . feeding supplement (RESOURCE BREEZE)  1 Container Oral TID BM  . furosemide  20 mg Oral Daily  . heparin  5,000 Units Subcutaneous 3 times per day  . ipratropium-albuterol  3 mL Nebulization QID  . methylPREDNISolone (SOLU-MEDROL) injection  60 mg Intravenous Q6H  . omega-3 acid ethyl esters  2 capsule Oral Daily  . pantoprazole  40 mg Oral Daily  .  polyethylene glycol  17 g Oral Daily   Continuous Infusions:    Active Problems:   HYPERTENSION   Coronary artery disease   COPD exacerbation   Protein-calorie malnutrition, severe   Acute-on-chronic respiratory failure   Time spent 20 minutes

## 2014-02-01 NOTE — Progress Notes (Signed)
PHARMACIST - PHYSICIAN COMMUNICATION DR:   Goodrich CONCERNING: Antibiotic IV to Oral Route Change Policy  RECOMMENDATION: This patient is receiving Zithromax by the intravenous route.  Based on criteria approved by the Pharmacy and Therapeutics Committee, the antibiotic(s) is/are being converted to the equivalent oral dose form(s).  DESCRIPTION: These criteria include:  Patient being treated for a respiratory tract infection, urinary tract infection, cellulitis or clostridium difficile associated diarrhea if on metronidazole  The patient is not neutropenic and does not exhibit a GI malabsorption state  The patient is eating (either orally or via tube) and/or has been taking other orally administered medications for a least 24 hours  The patient is improving clinically and has a Tmax < 100.5  If you have questions about this conversion, please contact the Pharmacy Department  [x]  ( [phone] )  [place] []  ( [phone] )  [place]  []  ( [phone] )  [facility] []  ( [phone] )  [facility]    S. Renika Shiflet, PharmD 

## 2014-02-01 NOTE — Progress Notes (Signed)
NUTRITION FOLLOW UP  Intervention:   Continue with current nutrition plan of care  Nutrition Dx:   Inadequate oral intake related to decreased appetite as evidenced by PO: 50%; progressing  Goal:   Pt will meet >90% of estimated nutritional needs; goal met  Monitor:   PO intake, labs, skin assessments, weight changes, I/O's  Assessment:   Pt intake has improved since last visit (PO: 75-100%). Also receiving Resource Breeze po TID, each supplement provides 250 kcal and 9 grams of protein, for additional nutritional support, due to dx of severe malnutriton. No new weight since last visit, so unable to assess weight changes.  Pt still struggles with constipation; per MD, pt on aggressive bowel regimen. No BM since 01/29/14.   Height: Ht Readings from Last 1 Encounters:  01/28/14 5' 8"  (1.727 m)    Weight Status:   Wt Readings from Last 1 Encounters:  01/28/14 120 lb (54.432 kg)    Re-estimated needs:  Kcal: 1600-1800 daily  Protein: 55-66 grams daily  Fluid: 1.6-1.8 L daily  Skin: 01/29/14  Diet Order: Cardiac   Intake/Output Summary (Last 24 hours) at 02/01/14 1317 Last data filed at 02/01/14 1154  Gross per 24 hour  Intake    360 ml  Output   1800 ml  Net  -1440 ml    Last BM: WDL   Labs:   Recent Labs Lab 01/30/14 0549 01/31/14 0539 02/01/14 0536  NA 144 142 140  K 3.2* 3.4* 3.6*  CL 103 100 99  CO2 31 32 31  BUN 15 11 13   CREATININE 0.48* 0.43* 0.39*  CALCIUM 8.4 8.2* 8.4  MG  --  2.2 2.3  GLUCOSE 144* 137* 117*    CBG (last 3)  No results found for this basename: GLUCAP,  in the last 72 hours  Scheduled Meds: . amLODipine  5 mg Oral Daily  . aspirin EC  81 mg Oral Daily  . atorvastatin  20 mg Oral q1800  . azithromycin  500 mg Oral Daily  . cefTRIAXone (ROCEPHIN)  IV  1 g Intravenous Q24H  . docusate sodium  100 mg Oral BID  . feeding supplement (RESOURCE BREEZE)  1 Container Oral TID BM  . furosemide  20 mg Oral Daily  . heparin  5,000  Units Subcutaneous 3 times per day  . ipratropium-albuterol  3 mL Nebulization QID  . methylPREDNISolone (SOLU-MEDROL) injection  60 mg Intravenous Q6H  . omega-3 acid ethyl esters  2 capsule Oral Daily  . pantoprazole  40 mg Oral Daily  . polyethylene glycol  17 g Oral BID  . potassium chloride  40 mEq Oral BID  . senna  1 tablet Oral QHS    Continuous Infusions:   Mikias Lanz A. Jimmye Norman, RD, LDN Pager: [phone]

## 2014-02-02 DIAGNOSIS — E43 Unspecified severe protein-calorie malnutrition: Secondary | ICD-10-CM | POA: Diagnosis not present

## 2014-02-02 DIAGNOSIS — J9620 Acute and chronic respiratory failure, unspecified whether with hypoxia or hypercapnia: Secondary | ICD-10-CM | POA: Diagnosis not present

## 2014-02-02 DIAGNOSIS — J441 Chronic obstructive pulmonary disease with (acute) exacerbation: Secondary | ICD-10-CM | POA: Diagnosis not present

## 2014-02-02 NOTE — Progress Notes (Signed)
PROGRESS NOTE  [name] RUE:[id number] DOB: 08/24/1936 DOA: 01/28/2014 PCP: Cassell SmilesFUSCO,LAWRENCE J., MD Pulmonologist- Dr. Vassie LollAlva   Summary: 78 year old man with history of COPD, nocturnal oxygen use, ex-smoker presented with several day history of increasing cough and dyspnea. Of note, his wife died one week prior to admission. Admitted for COPD exacerbation.  Assessment/Plan: 1. Acute on chronic respiratory failure, uses nocturnal oxygen at home 3L. remains oxygen-dependent throughout the day. 2. COPD exacerbation. No significant change. Maintained at home on albuterol, fluticasone/vilanterol/Spiriva at home. 3. Severe COPD. Followed by Dr. Vassie LollAlva in [place]. Treated with doxycycline and Mucinex 30/2015. PFTS in past showed severe obstruction.  4. Mild upper abdominal pain. Bowels moving. Exam benign. Has a right inguinal hernia which is nontender and reducible. Follow clinically. 5. Hypertension. Well controlled. 6. H/o CABG 2009 7. History of tobacco dependence, quit in 1999.  8. Social. Wife died last week. 9. Severe malnutrition in context of acute illness. Resource Breeze po TID.   No significant change, remains stable but little improvement. Respiratory status and hypoxia stable. Continue supplemental oxygen, bronchodilators, antibiotics, steroids. Suspect improvement will be very slow. Consider pulmonary consultation if does not start to improve.  Continue aggressive bowel regimen   Code Status: full code DVT prophylaxis: heparin Family Communication:  [facility]  Brendia Sacksaniel Georgeanne Frankland, MD  [facility]  Pager [phone] If 7PM-7AM, please contact night-coverage at [email or web address], password [email or web address] 02/02/2014, 5:05 PM  LOS: 5 days   Consultants:    Procedures:    Antibiotics:  Rocephin 4/13>>  Azithromycin 4/13 >>   HPI/Subjective: He feels about the same. Still short of breath which seems to wax and wane. Some upper abdominal pain  improved after bowel movement today. Hernia nontender.  Objective: Filed Vitals:   02/02/14 1000 02/02/14 1132 02/02/14 1412 02/02/14 1535  BP: 142/84  120/66   Pulse:   103   Temp:   98.5 F (36.9 C)   TempSrc:   Oral   Resp:   18   Height:      Weight:      SpO2:  92% 92% 88%    Intake/Output Summary (Last 24 hours) at 02/02/14 1705 Last data filed at 02/02/14 1447  Gross per 24 hour  Intake   1140 ml  Output   1500 ml  Net   -360 ml     Filed Weights   01/28/14 1226 01/28/14 1816  Weight: 59.875 kg (132 lb) 54.432 kg (120 lb)    Exam:   Afebrile, vital signs are stable. Hypoxia , stable, remains on 3 L.  Gen. Appears calm, comfortable, lying nearly flat in bed.  Respiratory. Fair air movement. No frank rhonchi or rales, few wheezes. Mild increased respiratory effort. Able to speak in full sentences.  Cardiovascular regular rate and rhythm. No murmur, rub or gallop.  Abdomen is soft, non-distended, mild mid to upper abdominal pain with palpation. No guarding. Right inguinal hernia is soft, nontender, reducible. Overlying skin appears unremarkable.  Data Reviewed:  Scheduled Meds: . amLODipine  5 mg Oral Daily  . aspirin EC  81 mg Oral Daily  . atorvastatin  20 mg Oral q1800  . azithromycin  500 mg Oral Daily  . cefTRIAXone (ROCEPHIN)  IV  1 g Intravenous Q24H  . docusate sodium  100 mg Oral BID  . feeding supplement (RESOURCE BREEZE)  1 Container Oral TID BM  . furosemide  20 mg Oral Daily  . heparin  5,000 Units Subcutaneous 3  times per day  . ipratropium-albuterol  3 mL Nebulization QID  . methylPREDNISolone (SOLU-MEDROL) injection  60 mg Intravenous Q6H  . omega-3 acid ethyl esters  2 capsule Oral Daily  . pantoprazole  40 mg Oral Daily  . polyethylene glycol  17 g Oral BID  . senna  1 tablet Oral QHS   Continuous Infusions:    Active Problems:   HYPERTENSION   Coronary artery disease   COPD exacerbation   Protein-calorie malnutrition,  severe   Acute-on-chronic respiratory failure   Time spent 20 minutes

## 2014-02-03 DIAGNOSIS — J441 Chronic obstructive pulmonary disease with (acute) exacerbation: Secondary | ICD-10-CM | POA: Diagnosis not present

## 2014-02-03 DIAGNOSIS — J9620 Acute and chronic respiratory failure, unspecified whether with hypoxia or hypercapnia: Secondary | ICD-10-CM | POA: Diagnosis not present

## 2014-02-03 DIAGNOSIS — E43 Unspecified severe protein-calorie malnutrition: Secondary | ICD-10-CM | POA: Diagnosis not present

## 2014-02-03 MED ORDER — POTASSIUM CHLORIDE CRYS ER 20 MEQ PO TBCR
40.0000 meq | EXTENDED_RELEASE_TABLET | Freq: Once | ORAL | Status: AC
  Administered 2014-02-03: 40 meq via ORAL
  Filled 2014-02-03: qty 2

## 2014-02-03 MED ORDER — METHYLPREDNISOLONE SODIUM SUCC 125 MG IJ SOLR
60.0000 mg | Freq: Two times a day (BID) | INTRAMUSCULAR | Status: DC
  Administered 2014-02-03 – 2014-02-04 (×2): 60 mg via INTRAVENOUS
  Filled 2014-02-03 (×2): qty 2

## 2014-02-03 NOTE — Progress Notes (Signed)
PROGRESS NOTE  [name] UJW:[id number] DOB: 10/08/1936 DOA: 01/28/2014 PCP: Cassell SmilesFUSCO,LAWRENCE J., MD Pulmonologist- Dr. Vassie LollAlva   Summary: 78 year old man with history of COPD, nocturnal oxygen use, ex-smoker presented with several day history of increasing cough and dyspnea. Of note, his wife died one week prior to admission. Admitted for COPD exacerbation.  Assessment/Plan: 1. Acute on chronic respiratory failure, uses nocturnal oxygen at home 3L. Remains oxygen-dependent throughout the day. 2. COPD exacerbation. Improving. Maintained at home on albuterol, fluticasone/vilanterol/Spiriva at home. 3. Severe COPD. Followed by Dr. Vassie LollAlva in [place].  PFTS in past showed severe obstruction.  4. Mild upper abdominal pain. Resolved. Exam benign. Has a right inguinal hernia which is nontender. Follow clinically. 5. Hypertension. Well controlled. 6. H/o CABG 2009 7. History of tobacco dependence, quit in 1999.  8. Social. Wife died last week. 9. Severe malnutrition in context of acute illness. Resource Breeze po TID.   Appears clinically improved. Completes antibiotics today. Wean steroids tomorrow.  Possible discharge in next 48 hours.  Continue aggressive bowel regimen   Code Status: full code DVT prophylaxis: heparin Family Communication:  [facility]  Brendia Sacksaniel Goodrich, MD  [facility]  Pager [phone] If 7PM-7AM, please contact night-coverage at [email or web address], password [email or web address] 02/03/2014, 2:29 PM  LOS: 6 days   Consultants:    Procedures:    Antibiotics:  Rocephin 4/13>> 4/19 Azithromycin 4/13 >> 4/19  HPI/Subjective: "I feel better today." Breathing better. Eating well. No abdominal pain. No hernia pain.  Objective: Filed Vitals:   02/03/14 0606 02/03/14 0722 02/03/14 0918 02/03/14 1107  BP: 139/82  136/78   Pulse: 88     Temp: 98.2 F (36.8 C)     TempSrc: Oral     Resp: 18     Height:      Weight:      SpO2:  92%  91%     Intake/Output Summary (Last 24 hours) at 02/03/14 1429 Last data filed at 02/03/14 1217  Gross per 24 hour  Intake    240 ml  Output   1950 ml  Net  -1710 ml     Filed Weights   01/28/14 1226 01/28/14 1816  Weight: 59.875 kg (132 lb) 54.432 kg (120 lb)    Exam:   Afebrile, vital signs are stable. Hypoxia stable, remains on 3 L.  Gen. Appears calm, more comfortable comfortable.  Respiratory. Improved air movement. No frank wheezes, rales or rhonchi. Decreased respiratory effort.  Cardiovascular RRR. No murmur, rub or gallop. No LE edema.  Abdomen soft, nontender, nondistended. Right inguinal hernia soft, nontender.  Data Reviewed:  Scheduled Meds: . amLODipine  5 mg Oral Daily  . aspirin EC  81 mg Oral Daily  . atorvastatin  20 mg Oral q1800  . azithromycin  500 mg Oral Daily  . cefTRIAXone (ROCEPHIN)  IV  1 g Intravenous Q24H  . docusate sodium  100 mg Oral BID  . feeding supplement (RESOURCE BREEZE)  1 Container Oral TID BM  . furosemide  20 mg Oral Daily  . heparin  5,000 Units Subcutaneous 3 times per day  . ipratropium-albuterol  3 mL Nebulization QID  . methylPREDNISolone (SOLU-MEDROL) injection  60 mg Intravenous Q6H  . omega-3 acid ethyl esters  2 capsule Oral Daily  . pantoprazole  40 mg Oral Daily  . polyethylene glycol  17 g Oral BID  . senna  1 tablet Oral QHS   Continuous Infusions:    Active Problems:  HYPERTENSION   Coronary artery disease   COPD exacerbation   Protein-calorie malnutrition, severe   Acute-on-chronic respiratory failure   Time spent 15 minutes

## 2014-02-04 DIAGNOSIS — J441 Chronic obstructive pulmonary disease with (acute) exacerbation: Secondary | ICD-10-CM | POA: Diagnosis not present

## 2014-02-04 DIAGNOSIS — J9620 Acute and chronic respiratory failure, unspecified whether with hypoxia or hypercapnia: Secondary | ICD-10-CM | POA: Diagnosis not present

## 2014-02-04 DIAGNOSIS — E43 Unspecified severe protein-calorie malnutrition: Secondary | ICD-10-CM | POA: Diagnosis not present

## 2014-02-04 MED ORDER — PREDNISONE 20 MG PO TABS
40.0000 mg | ORAL_TABLET | Freq: Every day | ORAL | Status: DC
  Administered 2014-02-05: 40 mg via ORAL
  Filled 2014-02-04: qty 2

## 2014-02-04 NOTE — Progress Notes (Signed)
PROGRESS NOTE  Joseph CraftWayne J [name] [id number] DOB: 02/06/1936 DOA: 01/28/2014 PCP: Cassell SmilesFUSCO,LAWRENCE J., MD Pulmonologist- Dr. Vassie LollAlva   Summary: 78 year old man with history of COPD, nocturnal oxygen use, ex-smoker presented with several day history of increasing cough and dyspnea. Of note, his wife died one week prior to admission. Admitted for COPD exacerbation. Treated with standard therapy. Condition was very slow to improve but appears to be near baseline and likely discharge next 48 hours. Has had intermittent abdominal pain of unclear significance, eating well, bowels moving normally. Continue to monitor clinically.  Assessment/Plan: 1. Acute on chronic respiratory failure, uses nocturnal oxygen at home 3L. Remains oxygen-dependent throughout the day, suspect will need continuous oxygen on discharge. 2. COPD exacerbation. Continues to improve. Maintained at home on albuterol, fluticasone/vilanterol/Spiriva at home. 3. Severe COPD. Followed by Dr. Vassie LollAlva in [place].  PFTs in past showed severe obstruction.  4. Mild periumbilical abdominal pain. Intermittent, significance unclear. Exam remains benign. Has a right inguinal hernia which is nontender. Several bowel movements now. 5. Hypertension. Well controlled. 6. H/o CABG 2009 7. History of tobacco dependence, quit in 1999.  8. Social. Wife died last week. 9. Severe malnutrition in context of acute illness. Resource Breeze po TID.   Slowly improving, COPD likely near baseline. Completed antibiotics.  Wean steroids.  Possible discharge in next 24-48 hours.  Continue aggressive bowel regimen   Code Status: full code DVT prophylaxis: heparin Family Communication:  Dis[facility], [facility]  Brendia Sacksaniel Bastion Bolger, MD  [facility]  Pager [phone] If 7PM-7AM, please contact night-coverage at [email or web address], password [email or web address] 02/04/2014, 3:17 PM  LOS: 7 days   Consultants:    Procedures:    Antibiotics:  Rocephin  4/13>> 4/19 Azithromycin 4/13 >> 4/19  HPI/Subjective: Breathing better. Reported some right-sided abdominal pain. Positive bowel movement last night. Tolerating diet. Hernia without pain.  Objective: Filed Vitals:   02/04/14 1103 02/04/14 1308 02/04/14 1309 02/04/14 1507  BP:    143/75  Pulse:    98  Temp:    98.5 F (36.9 C)  TempSrc:    Oral  Resp:    18  Height:      Weight:      SpO2: 9% 81% 95% 97%    Intake/Output Summary (Last 24 hours) at 02/04/14 1517 Last data filed at 02/04/14 1300  Gross per 24 hour  Intake    524 ml  Output   1550 ml  Net  -1026 ml     Filed Weights   01/28/14 1226 01/28/14 1816  Weight: 59.875 kg (132 lb) 54.432 kg (120 lb)    Exam:   Afebrile, vital signs are stable. Hypoxia stable, remains on 3 L.  Gen. Appears calm, comfortable this morning.  Cardiovascular. Regular rate and rhythm. No murmur, rub or gallop. No lower extremity edema.  Respiratory. Fair air movement. No wheezes, rales or rhonchi. Mild increased respiratory effort, suspect at baseline.  Abdomen. Positive bowel sounds. Soft, mild periumbilical pain. No guarding. Right inguinal hernia is soft, nontender, easily reducible.  Data Reviewed:  Scheduled Meds: . amLODipine  5 mg Oral Daily  . aspirin EC  81 mg Oral Daily  . atorvastatin  20 mg Oral q1800  . docusate sodium  100 mg Oral BID  . feeding supplement (RESOURCE BREEZE)  1 Container Oral TID BM  . furosemide  20 mg Oral Daily  . heparin  5,000 Units Subcutaneous 3 times per day  . ipratropium-albuterol  3 mL Nebulization QID  .  methylPREDNISolone (SOLU-MEDROL) injection  60 mg Intravenous Q12H  . omega-3 acid ethyl esters  2 capsule Oral Daily  . pantoprazole  40 mg Oral Daily  . polyethylene glycol  17 g Oral BID  . senna  1 tablet Oral QHS   Continuous Infusions:    Active Problems:   HYPERTENSION   Coronary artery disease   COPD exacerbation   Protein-calorie malnutrition, severe    Acute-on-chronic respiratory failure   Time spent 20 minutes

## 2014-02-04 NOTE — Progress Notes (Signed)
PT Cancellation Note  Patient Details Name: [name] MRN: [id number] DOB: 06/14/1936   Cancelled Treatment:    Reason Eval/Treat Not Completed: Patient declined, no reason specified Several attempts were made to see pt today.  Earlier, he had abdominal pain.  Now, he states that he feels better but he just does not wish to work with me.  I will try again tomorrow.  [name] 02/04/2014, 2:01 PM

## 2014-02-04 NOTE — Progress Notes (Signed)
Patient's O2 saturation at rest on RA is 91%.  With ambulation on RA, O2 sats drop to 81%.  Placed 3lpm via [place] od O2 on patient at rest sats rise to 96%.  Patient's O2 saturations on 3lpm via [place] with ambulation are 89%.

## 2014-02-05 DIAGNOSIS — R05 Cough: Secondary | ICD-10-CM | POA: Diagnosis not present

## 2014-02-05 DIAGNOSIS — E43 Unspecified severe protein-calorie malnutrition: Secondary | ICD-10-CM | POA: Diagnosis not present

## 2014-02-05 DIAGNOSIS — J9620 Acute and chronic respiratory failure, unspecified whether with hypoxia or hypercapnia: Secondary | ICD-10-CM | POA: Diagnosis not present

## 2014-02-05 DIAGNOSIS — R059 Cough, unspecified: Secondary | ICD-10-CM

## 2014-02-05 DIAGNOSIS — J441 Chronic obstructive pulmonary disease with (acute) exacerbation: Secondary | ICD-10-CM | POA: Diagnosis not present

## 2014-02-05 MED ORDER — GUAIFENESIN ER 600 MG PO TB12
1200.0000 mg | ORAL_TABLET | Freq: Two times a day (BID) | ORAL | Status: DC
  Administered 2014-02-05 – 2014-02-06 (×2): 1200 mg via ORAL
  Filled 2014-02-05 (×2): qty 2

## 2014-02-05 MED ORDER — PREDNISONE 20 MG PO TABS
60.0000 mg | ORAL_TABLET | Freq: Every day | ORAL | Status: DC
  Administered 2014-02-06: 60 mg via ORAL
  Filled 2014-02-05: qty 3

## 2014-02-05 NOTE — Progress Notes (Signed)
PROGRESS NOTE  Joseph CraftWayne J [name] [id number] DOB: 05/11/1936 DOA: 01/28/2014 PCP: Cassell SmilesFUSCO,LAWRENCE J., MD Pulmonologist- Dr. Vassie LollAlva   Summary: 78 year old man with history of COPD, nocturnal oxygen use, ex-smoker presented with several day history of increasing cough and dyspnea. Of note, his wife died one week prior to admission. Admitted for COPD exacerbation. Treated with standard therapy. Condition was very slow to improve but appears to be near baseline and likely discharge next 48 hours. Has had intermittent abdominal pain of unclear significance, eating well, bowels moving normally. Continue to monitor clinically.  Assessment/Plan: 1. Acute on chronic respiratory failure, uses nocturnal oxygen at home 3L. Remains oxygen-dependent throughout the day, suspect will need continuous oxygen on discharge. Add flutter valve. Continue current treatments. 2. COPD exacerbation. Continues to improve. Maintained at home on albuterol, fluticasone/vilanterol/Spiriva at home. 3. Severe COPD. Followed by Dr. Vassie LollAlva in [place].  PFTs in past showed severe obstruction.  4. Mild periumbilical abdominal pain. Intermittent, significance unclear. Exam remains benign. Has a right inguinal hernia which is nontender. Several bowel movements now. 5. Hypertension. Well controlled. 6. H/o CABG 2009 7. History of tobacco dependence, quit in 1999.  8. Social. Wife died last week. 9. Severe malnutrition in context of acute illness. Resource Breeze po TID. 10. Confusion.  Patient's son reports that patient has been increasingly confused today.  He is not on any sedative medications/narcotics.  He was alert and oriented at the time of my exam.  Will continue to monitor.   Code Status: full code DVT prophylaxis: heparin Family Communication: discussed with son over the phone Disposition Plan: SNF placement  Erick BlinksJehanzeb Memon, MD  [facility]  Pager [phone] If 7PM-7AM, please contact night-coverage at  [email or web address], password [email or web address] 02/05/2014, 5:47 PM  LOS: 8 days   Consultants:    Procedures:    Antibiotics:  Rocephin 4/13>> 4/19 Azithromycin 4/13 >> 4/19  HPI/Subjective: Still feels short of breath, nonproductive cough  Objective: Filed Vitals:   02/05/14 1017 02/05/14 1123 02/05/14 1252 02/05/14 1432  BP:   149/79   Pulse:   103   Temp:   98 F (36.7 C)   TempSrc:   Oral   Resp:   20   Height:      Weight:      SpO2: 95% 93% 93% 93%    Intake/Output Summary (Last 24 hours) at 02/05/14 1747 Last data filed at 02/05/14 1455  Gross per 24 hour  Intake    750 ml  Output   1450 ml  Net   -700 ml     Filed Weights   01/28/14 1226 01/28/14 1816  Weight: 59.875 kg (132 lb) 54.432 kg (120 lb)    Exam:   Afebrile, vital signs are stable. Hypoxia stable, remains on 3 L.  Gen. Appears calm, comfortable this morning.  Cardiovascular. Regular rate and rhythm. No murmur, rub or gallop. No lower extremity edema.  Respiratory. Diminished breath sounds bilaterally Mild increased respiratory effort, suspect at baseline.  Abdomen. Positive bowel sounds. Soft, mild periumbilical pain. No guarding. Right inguinal hernia is soft, nontender, easily reducible.  Data Reviewed:  Scheduled Meds: . amLODipine  5 mg Oral Daily  . aspirin EC  81 mg Oral Daily  . atorvastatin  20 mg Oral q1800  . docusate sodium  100 mg Oral BID  . feeding supplement (RESOURCE BREEZE)  1 Container Oral TID BM  . furosemide  20 mg Oral Daily  . heparin  5,000 Units Subcutaneous 3 times per day  .  ipratropium-albuterol  3 mL Nebulization QID  . omega-3 acid ethyl esters  2 capsule Oral Daily  . pantoprazole  40 mg Oral Daily  . polyethylene glycol  17 g Oral BID  . predniSONE  40 mg Oral Q breakfast  . senna  1 tablet Oral QHS   Continuous Infusions:    Active Problems:   HYPERTENSION   Coronary artery disease   COPD exacerbation   Protein-calorie malnutrition, severe    Acute-on-chronic respiratory failure   Time spent 20 minutes

## 2014-02-05 NOTE — Clinical Social Work Psychosocial (Signed)
Clinical Social Work Department BRIEF PSYCHOSOCIAL ASSESSMENT 02/05/2014  Patient:  [name], [name]     Account Number:  [id number]     Admit date:  01/28/2014  Clinical Social Worker:  Edwyna Shell, CLINICAL SOCIAL WORKER  Date/Time:  02/05/2014 11:00 AM  Referred by:  Physician  Date Referred:  02/05/2014 Referred for  SNF Placement   Other Referral:   Interview type:  Patient Other interview type:    PSYCHOSOCIAL DATA Living Status:  ALONE Admitted from facility:   Level of care:   Primary support name:  [name] Primary support relationship to patient:  CHILD, ADULT Degree of support available:   Patient lives alone, wife recently died.  Has son and daughter in area, supportive but somewhat limited availability    CURRENT CONCERNS Current Concerns  Post-Acute Placement   Other Concerns:    SOCIAL WORK ASSESSMENT / PLAN CSW met w patient in room, patient oriented x4 but kept eyes closed and head down, speech soft.  Per PT, patient has limited short term memory, unable to retain information given 5 minutes before.  CSW found that patient was able to answer questions, stated that he had concerns about repairs/construction on his home.    Patient lives alone, was primary caregiver for wife who passed away several weeks ago.  Patient says "I did everything for her."  Was driving, walking, attending to all his ADLs as well as caring for disabled wife.  Has son and daughter in area, says son is able to be of more help to him.    Discussed SNF placement w patient, covered insurance requirements including possible copays.  Patient only willing to "consider" SNF placement, expressed concern about "repairs I need at my house - front door.  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  Says house is secure and perhaps son can help w supervisoin of any needed work.  Is willing to talk w son.  Says he feels  "weak", "I just wish this were over."  Clarified that he meant that he wants this episode of weakness and illness to "get over."    Patient will discuss any bed offers and SNF placement w son, will consider rehab for short term regaining of strength.  Intends to return home as soon as possible - "I want to get on with my life."   Assessment/plan status:  Psychosocial Support/Ongoing Assessment of Needs Other assessment/ plan:   Information/referral to community resources:   SNF list    PATIENT'S/FAMILY'S RESPONSE TO PLAN OF CARE: Patient frustrated by illness and weakness, inability to do what he normally does.  Wants to go home but realizes he may be too weak.        Edwyna Shell, LCSW Clinical Social Worker [phone])

## 2014-02-05 NOTE — Clinical Social Work Placement (Signed)
    Clinical Social Work Department CLINICAL SOCIAL WORK PLACEMENT NOTE 02/05/2014  Patient:  Joseph CraftWEATHERMAN,[name]  Account Number:  [id number] Admit date:  01/28/2014  Clinical Social Worker:  Santa GeneraANNE CUNNINGHAM, CLINICAL SOCIAL WORKER  Date/time:  02/05/2014 11:30 AM  Clinical Social Work is seeking post-discharge placement for this patient at the following level of care:   SKILLED NURSING   (*CSW will update this form in Epic as items are completed)   02/05/2014  Patient/family provided with [facility] Department of Clinical Social Work's list of facilities offering this level of care within the geographic area requested by the patient (or if unable, by the patient's family).  02/05/2014  Patient/family informed of their freedom to choose among providers that offer the needed level of care, that participate in Medicare, Medicaid or managed care program needed by the patient, have an available bed and are willing to accept the patient.  02/05/2014  Patient/family informed of [facility]' ownership interest in [facility], as well as of the fact that they are under no obligation to receive care at this facility.  PASARR submitted to EDS on 02/05/2014 PASARR number received from EDS on   FL2 transmitted to all facilities in geographic area requested by pt/family on  02/05/2014 FL2 transmitted to all facilities within larger geographic area on   Patient informed that his/her managed care company has contracts with or will negotiate with  certain facilities, including the following:     Patient/family informed of bed offers received:   Patient chooses bed at  Physician recommends and patient chooses bed at    Patient to be transferred to  on   Patient to be transferred to facility by   The following physician request were entered in Epic:   Additional Comments:  Santa GeneraAnne Cunningham, LCSW Clinical Social Worker [phone])

## 2014-02-05 NOTE — Evaluation (Signed)
Physical Therapy Evaluation Patient Details Name: Joseph CraftWayne J Navarra MRN: [id number] DOB: 01/04/1936 Today's Date: 02/05/2014   History of Present Illness  Pt is admitted with exacerbation of COPD.  He is a 78 year old gentleman whose wife died about 1 week PTA.  He had been the primary CG of her during her illness and by his own admission, did not take care of himself during this time.  His son lives nearby.  Pt normally ambulates with a cane and uses O2 (3L/min) at night.  Pt has become malnourished and is now exceedingly frail.  Clinical Impression   Pt was seen for evaluation.  He was alert and oriented but found to have poor short term memory.  He had no recollection of the walk he took in the hall with nursing yesterday which was documented in the chart.  He is found to be severely deconditioned with significant pulmonary limitations.  O2 sat at rest on 3 L O2=95%.  He ambulated 2650' with a cane, min guard assist.  After 25', O2 sat=74% so O2 was increased to 4L/min.  While standing in the hallway, O2 sat slowly increased to 90%.  Gait was very laborious for him.  Because of his generalized weakness and deconditioning, I am strongly recommending SNF at d/c.  I explained the process to the pt and he was reluctantly agreeable.  However, with his poor short term memory, he may not remember any of our discussion and decline SNF when it is discussed with him the next time.  We will continue to follow him for generalized strengthening in order to try to improve gait stability and endurance.     Follow Up Recommendations SNF    Equipment Recommendations  None recommended by PT    Recommendations for Other Services   none    Precautions / Restrictions Precautions Precautions: Fall Restrictions Weight Bearing Restrictions: No      Mobility  Bed Mobility               General bed mobility comments: not tested...pt up in chair  Transfers Overall transfer level: Modified  independent Equipment used: None                Ambulation/Gait Ambulation/Gait assistance: Min guard Ambulation Distance (Feet): 50 Feet Assistive device: Straight cane Gait Pattern/deviations: Trunk flexed;Shuffle   Gait velocity interpretation: Below normal speed for 78/gender General Gait Details: pt needed to stop and rest x2 during gait...dyspnea was increased during gait and O2 on 3 L O2=74% after ambulating 25'  Stairs:  N/T   Pt has 6-7 steps to enter the home                      Balance Overall balance assessment: No apparent balance deficits (not formally assessed)                                                Home Living Family/patient expects to be discharged to:: Skilled nursing facility                      Prior Function Level of Independence: Independent with assistive device(s)                       Extremity/Trunk Assessment   Upper Extremity Assessment: Defer to  OT evaluation           Lower Extremity Assessment: Generalized weakness      Cervical / Trunk Assessment: Kyphotic  Communication   Communication: No difficulties  Cognition Arousal/Alertness: Awake/alert Behavior During Therapy: WFL for tasks assessed/performed Overall Cognitive Status: Within Functional Limits for tasks assessed       Memory: Decreased short-term memory                            Assessment/Plan    PT Assessment Patient needs continued PT services  PT Diagnosis Difficulty walking;Generalized weakness   PT Problem List Decreased strength;Decreased activity tolerance;Decreased mobility;Cardiopulmonary status limiting activity  PT Treatment Interventions Gait training;Therapeutic exercise;Functional mobility training   PT Goals (Current goals can be found in the Care Plan section) Acute Rehab PT Goals Patient Stated Goal: none stated PT Goal Formulation: With patient Time For Goal  Achievement: 02/19/14 Potential to Achieve Goals: Fair    Frequency Min 3X/week   Barriers to discharge Decreased caregiver support                     End of Session Equipment Utilized During Treatment: Gait belt Activity Tolerance: Patient limited by fatigue Patient left: in chair;with call bell/phone within reach;with nursing/sitter in room;with chair alarm set Nurse Communication: Mobility status         Time: 0926-1008 PT Time Calculation (min): 42 min   Charges:   PT Evaluation $Initial PT Evaluation Tier I: 1 Procedure     PT G Codes:          Konrad Pentalaudia L Markiah Janeway 02/05/2014, 10:24 AM

## 2014-02-06 DIAGNOSIS — D649 Anemia, unspecified: Secondary | ICD-10-CM | POA: Diagnosis not present

## 2014-02-06 DIAGNOSIS — R1311 Dysphagia, oral phase: Secondary | ICD-10-CM | POA: Diagnosis not present

## 2014-02-06 DIAGNOSIS — E870 Hyperosmolality and hypernatremia: Secondary | ICD-10-CM | POA: Diagnosis not present

## 2014-02-06 DIAGNOSIS — J9620 Acute and chronic respiratory failure, unspecified whether with hypoxia or hypercapnia: Secondary | ICD-10-CM | POA: Diagnosis not present

## 2014-02-06 DIAGNOSIS — R4789 Other speech disturbances: Secondary | ICD-10-CM | POA: Diagnosis not present

## 2014-02-06 DIAGNOSIS — E43 Unspecified severe protein-calorie malnutrition: Secondary | ICD-10-CM | POA: Diagnosis not present

## 2014-02-06 DIAGNOSIS — J449 Chronic obstructive pulmonary disease, unspecified: Secondary | ICD-10-CM | POA: Diagnosis not present

## 2014-02-06 DIAGNOSIS — J441 Chronic obstructive pulmonary disease with (acute) exacerbation: Secondary | ICD-10-CM | POA: Diagnosis not present

## 2014-02-06 DIAGNOSIS — R093 Abnormal sputum: Secondary | ICD-10-CM | POA: Diagnosis not present

## 2014-02-06 DIAGNOSIS — K5900 Constipation, unspecified: Secondary | ICD-10-CM | POA: Diagnosis not present

## 2014-02-06 DIAGNOSIS — R262 Difficulty in walking, not elsewhere classified: Secondary | ICD-10-CM | POA: Diagnosis not present

## 2014-02-06 DIAGNOSIS — M6281 Muscle weakness (generalized): Secondary | ICD-10-CM | POA: Diagnosis not present

## 2014-02-06 DIAGNOSIS — R059 Cough, unspecified: Secondary | ICD-10-CM | POA: Diagnosis not present

## 2014-02-06 DIAGNOSIS — E46 Unspecified protein-calorie malnutrition: Secondary | ICD-10-CM | POA: Diagnosis not present

## 2014-02-06 DIAGNOSIS — I10 Essential (primary) hypertension: Secondary | ICD-10-CM | POA: Diagnosis not present

## 2014-02-06 DIAGNOSIS — R279 Unspecified lack of coordination: Secondary | ICD-10-CM | POA: Diagnosis not present

## 2014-02-06 DIAGNOSIS — I259 Chronic ischemic heart disease, unspecified: Secondary | ICD-10-CM | POA: Diagnosis not present

## 2014-02-06 LAB — BASIC METABOLIC PANEL
BUN: 18 mg/dL (ref 6–23)
CO2: 31 meq/L (ref 19–32)
Calcium: 8 mg/dL — ABNORMAL LOW (ref 8.4–10.5)
Chloride: 99 mEq/L (ref 96–112)
Creatinine, Ser: 0.47 mg/dL — ABNORMAL LOW (ref 0.50–1.35)
GFR calc Af Amer: >90 mL/min (ref >90)
Glucose, Bld: 97 mg/dL (ref 70–99)
Potassium: 4 mEq/L (ref 3.7–5.3)
SODIUM: 139 meq/L (ref 137–147)

## 2014-02-06 LAB — CBC
HCT: 45.2 % (ref 39.0–52.0)
Hemoglobin: 14.9 g/dL (ref 13.0–17.0)
MCH: 32.7 pg (ref 26.0–34.0)
MCHC: 33 g/dL (ref 30.0–36.0)
MCV: 99.1 fL (ref 78.0–100.0)
PLATELETS: 301 10*3/uL (ref 150–400)
RBC: 4.56 MIL/uL (ref 4.22–5.81)
RDW: 12.8 % (ref 11.5–15.5)
WBC: 24 10*3/uL — AB (ref 4.0–10.5)

## 2014-02-06 MED ORDER — ALBUTEROL SULFATE (2.5 MG/3ML) 0.083% IN NEBU: 2.5000 mg | INHALATION_SOLUTION | Freq: Four times a day (QID) | RESPIRATORY_TRACT | Status: AC

## 2014-02-06 MED ORDER — POLYETHYLENE GLYCOL 3350 17 G PO PACK: 17.0000 g | PACK | Freq: Every day | ORAL | Status: AC

## 2014-02-06 MED ORDER — GUAIFENESIN ER 600 MG PO TB12: 1200.0000 mg | ORAL_TABLET | Freq: Two times a day (BID) | ORAL | Status: AC

## 2014-02-06 MED ORDER — ALBUTEROL SULFATE (2.5 MG/3ML) 0.083% IN NEBU: 2.5000 mg | INHALATION_SOLUTION | RESPIRATORY_TRACT | Status: DC | PRN

## 2014-02-06 MED ORDER — PREDNISONE 20 MG PO TABS: 60.0000 mg | ORAL_TABLET | Freq: Every day | ORAL | Status: DC

## 2014-02-06 NOTE — Clinical Social Work Placement (Signed)
Clinical Social Work Department CLINICAL SOCIAL WORK PLACEMENT NOTE 02/06/2014  Patient:  [name],[name]  Account Number:  [id number] Admit date:  01/28/2014  Clinical Social Worker:  ANNE CUNNINGHAM, CLINICAL SOCIAL WORKER  Date/time:  02/05/2014 11:30 AM  Clinical Social Work is seeking post-discharge placement for this patient at the following level of care:   SKILLED NURSING   (*CSW will update this form in Epic as items are completed)   02/05/2014  Patient/family provided with [facility] Department of Clinical Social Work's list of facilities offering this level of care within the geographic area requested by the patient (or if unable, by the patient's family).  02/05/2014  Patient/family informed of their freedom to choose among providers that offer the needed level of care, that participate in Medicare, Medicaid or managed care program needed by the patient, have an available bed and are willing to accept the patient.  02/05/2014  Patient/family informed of [facility]' ownership interest in [facility], as well as of the fact that they are under no obligation to receive care at this facility.  PASARR submitted to EDS on 02/05/2014 PASARR number received from EDS on 02/06/2014  FL2 transmitted to all facilities in geographic area requested by pt/family on  02/05/2014 FL2 transmitted to all facilities within larger geographic area on   Patient informed that his/her managed care company has contracts with or will negotiate with  certain facilities, including the following:     Patient/family informed of bed offers received:  02/06/2014 Patient chooses bed at [name] Physician recommends and patient chooses bed at    Patient to be transferred to [name] on  02/06/2014 Patient to be transferred to facility by EMS  The following physician request were entered in Epic:   Additional Comments: Deeana Atwater, MSW,  LCSWA [phone] 

## 2014-02-06 NOTE — Discharge Instructions (Signed)
Chronic Obstructive Pulmonary Disease  Chronic obstructive pulmonary disease (COPD) is a common lung condition in which airflow from the lungs is limited. COPD is a general term that can be used to describe many different lung problems that limit airflow, including both chronic bronchitis and emphysema.  If you have COPD, your lung function will probably never return to normal, but there are measures you can take to improve lung function and make yourself feel better.   CAUSES   · Smoking (common).    · Exposure to secondhand smoke.    · Genetic problems.  · Chronic inflammatory lung diseases or recurrent infections.  SYMPTOMS   · Shortness of breath, especially with physical activity.    · Deep, persistent (chronic) cough with a large amount of thick mucus.    · Wheezing.    · Rapid breaths (tachypnea).    · Gray or bluish discoloration (cyanosis) of the skin, especially in fingers, toes, or lips.    · Fatigue.    · Weight loss.    · Frequent infections or episodes when breathing symptoms become much worse (exacerbations).    · Chest tightness.  DIAGNOSIS   Your healthcare provider will take a medical history and perform a physical examination to make the initial diagnosis.  Additional tests for COPD may include:   · Lung (pulmonary) function tests.  · Chest X-ray.  · CT scan.  · Blood tests.  TREATMENT   Treatment available to help you feel better when you have COPD include:   · Inhaler and nebulizer medicines. These help manage the symptoms of COPD and make your breathing more comfortable  · Supplemental oxygen. Supplemental oxygen is only helpful if you have a low oxygen level in your blood.    · Exercise and physical activity. These are beneficial for nearly all people with COPD. Some people may also benefit from a pulmonary rehabilitation program.  HOME CARE INSTRUCTIONS   · Take all medicines (inhaled or pills) as directed by your health care provider.  · Only take over-the-counter or prescription medicines  for pain, fever, or discomfort as directed by your health care provider.    · Avoid over-the-counter medicines or cough syrups that dry up your airway (such as antihistamines) and slow down the elimination of secretions unless instructed otherwise by your healthcare provider.    · If you are a smoker, the most important thing that you can do is stop smoking. Continuing to smoke will cause further lung damage and breathing trouble. Ask your health care provider for help with quitting smoking. He or she can direct you to community resources or hospitals that provide support.  · Avoid exposure to irritants such as smoke, chemicals, and fumes that aggravate your breathing.  · Use oxygen therapy and pulmonary rehabilitation if directed by your health care provider. If you require home oxygen therapy, ask your healthcare provider whether you should purchase a pulse oximeter to measure your oxygen level at home.    · Avoid contact with individuals who have a contagious illness.  · Avoid extreme temperature and humidity changes.  · Eat healthy foods. Eating smaller, more frequent meals and resting before meals may help you maintain your strength.  · Stay active, but balance activity with periods of rest. Exercise and physical activity will help you maintain your ability to do things you want to do.  · Preventing infection and hospitalization is very important when you have COPD. Make sure to receive all the vaccines your health care provider recommends, especially the pneumococcal and influenza vaccines. Ask your healthcare provider whether you   need a pneumonia vaccine.  · Learn and use relaxation techniques to manage stress.  · Learn and use controlled breathing techniques as directed by your health care provider. Controlled breathing techniques include:    · Pursed lip breathing. Start by breathing in (inhaling) through your nose for 1 second. Then, purse your lips as if you were going to whistle and breathe out (exhale)  through the pursed lips for 2 seconds.    · Diaphragmatic breathing. Start by putting one hand on your abdomen just above your waist. Inhale slowly through your nose. The hand on your abdomen should move out. Then purse your lips and exhale slowly. You should be able to feel the hand on your abdomen moving in as you exhale.    · Learn and use controlled coughing to clear mucus from your lungs. Controlled coughing is a series of short, progressive coughs. The steps of controlled coughing are:    1. Lean your head slightly forward.    2. Breathe in deeply using diaphragmatic breathing.    3. Try to hold your breath for 3 seconds.    4. Keep your mouth slightly open while coughing twice.    5. Spit any mucus out into a tissue.    6. Rest and repeat the steps once or twice as needed.  SEEK MEDICAL CARE IF:   · You are coughing up more mucus than usual.    · There is a change in the color or thickness of your mucus.    · Your breathing is more labored than usual.    · Your breathing is faster than usual.    SEEK IMMEDIATE MEDICAL CARE IF:   · You have shortness of breath while you are resting.    · You have shortness of breath that prevents you from:  · Being able to talk.    · Performing your usual physical activities.    · You have chest pain lasting longer than 5 minutes.    · Your skin color is more cyanotic than usual.  · You measure low oxygen saturations for longer than 5 minutes with a pulse oximeter.  MAKE SURE YOU:   · Understand these instructions.  · Will watch your condition.  · Will get help right away if you are not doing well or get worse.  Document Released: 07/14/2005 Document Revised: 07/25/2013 Document Reviewed: 05/31/2013  Exit[employer]® Patient Information ©2014 [employer].

## 2014-02-06 NOTE — Discharge Summary (Signed)
Physician Discharge Summary  [name] RUE:[id number] DOB: 04/14/1936 DOA: 01/28/2014 [name] PCP: Joseph SmilesFUSCO,Joseph J., MD  Admit date: 01/28/2014 Discharge date: 02/06/2014  Time spent: 40 minutes  Recommendations for Outpatient Follow-up:  1. Patient is being discharged to skilled nursing facility for physical therapy 2. Follow up with pulmonologist Dr. [place]assie [name] in [place] in 2 weeks  Discharge Diagnoses:  Active Problems:   HYPERTENSION   Coronary artery disease   COPD exacerbation   Protein-calorie malnutrition, severe   Acute-on-chronic respiratory failure   Discharge Condition: stable  Diet recommendation: low salt  Filed Weights   01/28/14 1226 01/28/14 1816  Weight: 59.875 kg (132 lb) 54.432 kg (120 lb)    History of present illness:  This is a 78 year old man who has underlying COPD and has home oxygen at night, ex-smoker, who presents with approximately 3 to four-day history of dyspnea associated with it cough productive of yellow sputum. Unfortunately, his wife died in the last week or so. He denies any specific chest pain, fevers. He has been feeling rundown and has had a poor appetite. Despite being in the emergency room with continuous nebulizers, he has not improved and now he is being admitted for further treatment   Hospital Course:  This patient was admitted to the hospital with shortness of breath and copd exacerbation.  Patient has known copd and chronic respiratory failure on 3L of oxygen. He was treated for copd with bronchodilators, intravenous steroids and antibiotics.  Due to the severe nature of his underlying copd, his progress was very slow. Patient is now breathing comfortably on 4L.  I suspect further progress will be very slow.  He appears to be approaching his baseline.  He has been weaned to prednisone and would likely benefit from a slow taper.  He is recommended to follow up with his pulmonologist in 3 weeks. He was evaluated by physical therapy and  recommendations were for SNF placement.  Plans will be to discharge him to SNF later today for further care.  Procedures:    Consultations:    Discharge Exam: Filed Vitals:   02/06/14 0618  BP: 158/78  Pulse: 97  Temp: 98.6 F (37 C)  Resp: 20    General: NAd Cardiovascular: S1, s2,r rr Respiratory: diminished breath sounds with scattered rhonchi  Discharge Instructions You were cared for by a hospitalist during your hospital stay. If you have any questions about your discharge medications or the care you received while you were in the hospital after you are discharged, you can call the unit and asked to speak with the hospitalist on call if the hospitalist that took care of you is not available. Once you are discharged, your primary care physician will handle any further medical issues. Please note that NO REFILLS for any discharge medications will be authorized once you are discharged, as it is imperative that you return to your primary care physician (or establish a relationship with a primary care physician if you do not have one) for your aftercare needs so that they can reassess your need for medications and monitor your lab values.  Discharge Orders   Future Appointments Provider Department Dept Phone   03/21/2014 11:00 AM Joseph Sicksammy S Parrett, NP [facility] [phone]   Future Orders Complete By Expires   Call MD for:  difficulty breathing, headache or visual disturbances  As directed    Call MD for:  temperature >100.4  As directed    Diet - low sodium heart healthy  As  directed    Increase activity slowly  As directed        Medication List    STOP taking these medications       levofloxacin 500 MG tablet  Commonly known as:  LEVAQUIN      TAKE these medications       acetaminophen 500 MG tablet  Commonly known as:  TYLENOL  Take 500 mg by mouth every 6 (six) hours as needed for pain.     albuterol (2.5 MG/3ML) 0.083% nebulizer solution  Commonly  known as:  PROVENTIL  Take 3 mLs (2.5 mg total) by nebulization every 6 (six) hours.     albuterol (2.5 MG/3ML) 0.083% nebulizer solution  Commonly known as:  PROVENTIL  Take 3 mLs (2.5 mg total) by nebulization every 4 (four) hours as needed for wheezing or shortness of breath.     albuterol 108 (90 BASE) MCG/ACT inhaler  Commonly known as:  PROVENTIL HFA;VENTOLIN HFA  Inhale 2 puffs into the lungs every 6 (six) hours as needed for wheezing or shortness of breath.     ALPRAZolam 0.25 MG tablet  Commonly known as:  XANAX  Take 1 tablet (0.25 mg total) by mouth at bedtime as needed for sleep.     amLODipine 10 MG tablet  Commonly known as:  NORVASC  Take 5 mg by mouth daily.     aspirin 81 MG tablet  Take 81 mg by mouth daily.     docusate sodium 100 MG capsule  Commonly known as:  COLACE  Take 200 mg by mouth at bedtime as needed for constipation.     EQL FISH OIL 1000 MG Caps  Take 2 capsules (2,000 mg total) by mouth daily.     Fluticasone Furoate-Vilanterol 100-25 MCG/INH Aepb  Inhale 1 puff into the lungs daily.     furosemide 20 MG tablet  Commonly known as:  LASIX  Take 1 tablet by mouth daily.     guaiFENesin 600 MG 12 hr tablet  Commonly known as:  MUCINEX  Take 2 tablets (1,200 mg total) by mouth 2 (two) times daily.     omeprazole 20 MG capsule  Commonly known as:  PRILOSEC  Take 20 mg by mouth daily. 30 minutes before breakfast     polyethylene glycol packet  Commonly known as:  MIRALAX / GLYCOLAX  Take 17 g by mouth daily.     pravastatin 80 MG tablet  Commonly known as:  PRAVACHOL  Take 80 mg by mouth at bedtime.     predniSONE 20 MG tablet  Commonly known as:  DELTASONE  Take 3 tablets (60 mg total) by mouth daily with breakfast. Take 60mg  po daily and decrease by 10mg  every three days until complete     tiotropium 18 MCG inhalation capsule  Commonly known as:  SPIRIVA  Place 1 capsule (18 mcg total) into inhaler and inhale daily.       No  Known Allergies     Follow-up Information   Follow up with [name]., MD. Schedule an appointment as soon as possible for a visit in 2 weeks.   Specialty:  Internal Medicine   Contact information:   [address] [place] [address] [phone]        The results of significant diagnostics from this hospitalization (including imaging, microbiology, ancillary and laboratory) are listed below for reference.    Significant Diagnostic Studies: Dg Chest 2 View  01/28/2014   CLINICAL DATA:  Shortness of breath and cough  EXAM: CHEST  2 VIEW  COMPARISON:  November 07, 2013  FINDINGS: There is underlying emphysematous change. There is scarring in the right mid lung and in both bases. There is no edema or consolidation.  The heart size is normal. Pulmonary vascularity reflects underlying emphysema. Aorta is tortuous with atherosclerotic change. Patient is status post median sternotomy. No bone lesions.  IMPRESSION: Underlying emphysematous change.  No edema or consolidation.   Electronically Signed   By: Bretta BangWilliam  Woodruff M.D.   On: 01/28/2014 13:06    Microbiology: No results found for this or any previous visit (from the past 240 hour(s)).   Labs: Basic Metabolic Panel:  Recent Labs Lab 01/31/14 0539 02/01/14 0536 02/06/14 0522  NA 142 140 139  K 3.4* 3.6* 4.0  CL 100 99 99  CO2 32 31 31  GLUCOSE 137* 117* 97  BUN 11 13 18   CREATININE 0.43* 0.39* 0.47*  CALCIUM 8.2* 8.4 8.0*  MG 2.2 2.3  --    Liver Function Tests: No results found for this basename: AST, ALT, ALKPHOS, BILITOT, PROT, ALBUMIN,  in the last 168 hours No results found for this basename: LIPASE, AMYLASE,  in the last 168 hours No results found for this basename: AMMONIA,  in the last 168 hours CBC:  Recent Labs Lab 02/06/14 0522  WBC 24.0*  HGB 14.9  HCT 45.2  MCV 99.1  PLT 301   Cardiac Enzymes: No results found for this basename: CKTOTAL, CKMB, CKMBINDEX, TROPONINI,  in  the last 168 hours BNP: BNP (last 3 results) No results found for this basename: PROBNP,  in the last 8760 hours CBG: No results found for this basename: GLUCAP,  in the last 168 hours     Signed:  Erick BlinksJehanzeb Ashwini Jago  [facility] 02/06/2014, 3:17 PM

## 2014-02-06 NOTE — Clinical Documentation Improvement (Deleted)
Clinical Social Work Department CLINICAL SOCIAL WORK PLACEMENT NOTE 02/06/2014  Patient:  [name],[name]  Account Number:  [id number] Admit date:  01/28/2014  Clinical Social Worker:  Santa GeneraANNE CUNNINGHAM, CLINICAL SOCIAL WORKER  Date/time:  02/05/2014 11:30 AM  Clinical Social Work is seeking post-discharge placement for this patient at the following level of care:   SKILLED NURSING   (*CSW will update this form in Epic as items are completed)   02/05/2014  Patient/family provided with [facility] Department of Clinical Social Work's list of facilities offering this level of care within the geographic area requested by the patient (or if unable, by the patient's family).  02/05/2014  Patient/family informed of their freedom to choose among providers that offer the needed level of care, that participate in Medicare, Medicaid or managed care program needed by the patient, have an available bed and are willing to accept the patient.  02/05/2014  Patient/family informed of [facility]' ownership interest in [facility], as well as of the fact that they are under no obligation to receive care at this facility.  PASARR submitted to EDS on 02/05/2014 PASARR number received from EDS on 02/06/2014  FL2 transmitted to all facilities in geographic area requested by pt/family on  02/05/2014 FL2 transmitted to all facilities within larger geographic area on   Patient informed that his/her managed care company has contracts with or will negotiate with  certain facilities, including the following:     Patient/family informed of bed offers received:  02/06/2014 [facility] Physician recommends and patient chooses bed at    Patient to be transferred to [name] on  02/06/2014 Patient to be transferred to facility by EMS  The following physician request were entered in Epic:   Additional Comments: Reece LevyJanet Jerzy Roepke, MSW,  Theresia MajorsLCSWA [phone]

## 2014-02-06 NOTE — Progress Notes (Signed)
[name] is being discharged to [facility],report was called,and given to AltoJanet LPN. Vital signs stable.No c/o pain or discomfort noted. Transported via EMS to awaiting facility.Family aware.

## 2014-02-06 NOTE — Progress Notes (Signed)
Physical Therapy Treatment Patient Details Name: [name] MRN: [id number] DOB: 03/02/1936 Today's Date: 02/06/2014    History of Present Illness      PT Comments    Gait training x 150 feet with SBA to stairwell.  Attempted stair training prior discharge, pt refused to attempt stairs.  Min cueing required with gait training for sequencing with SPC, posture and to reduce shuffling.  Pt left in chair with son and CSW in room.  No reports of pain or distress breathing.  Follow Up Recommendations        Equipment Recommendations       Recommendations for Other Services       Precautions / Restrictions Precautions Precautions: Fall Restrictions Weight Bearing Restrictions: No    Mobility  Bed Mobility               General bed mobility comments: not tested...pt up in chair  Transfers Overall transfer level: Modified independent Equipment used: None                Ambulation/Gait Ambulation/Gait assistance: Supervision Ambulation Distance (Feet): 150 Feet Assistive device: Straight cane Gait Pattern/deviations: Trunk flexed;Shuffle   Gait velocity interpretation: Below normal speed for age/gender General Gait Details: gait training with 4L O2... no rest breaks required   Stairs            Wheelchair Mobility    Modified Rankin (Stroke Patients Only)       Balance                                    Cognition Arousal/Alertness: Awake/alert Behavior During Therapy: WFL for tasks assessed/performed Overall Cognitive Status: Within Functional Limits for tasks assessed       Memory: Decreased short-term memory              Exercises      General Comments         Home Living                      Prior Function            PT Goals (current goals can now be found in the care plan section) Progress towards PT goals: Progressing toward goals    Frequency       PT Plan Current plan remains  appropriate    Co-evaluation             End of Session Equipment Utilized During Treatment: Gait belt Activity Tolerance: Patient limited by fatigue Patient left: in chair;with call bell/phone within reach;with chair alarm set;with nursing/sitter in room;with family/visitor present     Time: [id number] PT Time Calculation (min): 19 min  Charges:  $Gait Training: 8-22 mins                    G Codes:      Juel BurrowCasey Jo Cockerham 02/06/2014, 3:07 PM

## 2014-02-06 NOTE — Social Work (Signed)
SNF bed offers provided to patient and son- they have [facility] - We have discussed that the patient has an outstanding auto claim that could be a barrier to Medicare paying for SNF- patient and son understand that the patient could be billed for SNF stay. Plan for EMS transport this pm.   Reece LevyJanet Schae Cando, MSW, Theresia MajorsLCSWA [phone]

## 2014-02-11 DIAGNOSIS — I10 Essential (primary) hypertension: Secondary | ICD-10-CM | POA: Diagnosis not present

## 2014-02-11 DIAGNOSIS — J449 Chronic obstructive pulmonary disease, unspecified: Secondary | ICD-10-CM | POA: Diagnosis not present

## 2014-02-11 DIAGNOSIS — I259 Chronic ischemic heart disease, unspecified: Secondary | ICD-10-CM | POA: Diagnosis not present

## 2014-02-11 DIAGNOSIS — E46 Unspecified protein-calorie malnutrition: Secondary | ICD-10-CM | POA: Diagnosis not present

## 2014-02-26 DIAGNOSIS — J449 Chronic obstructive pulmonary disease, unspecified: Secondary | ICD-10-CM | POA: Diagnosis not present

## 2014-02-26 DIAGNOSIS — I10 Essential (primary) hypertension: Secondary | ICD-10-CM | POA: Diagnosis not present

## 2014-02-26 DIAGNOSIS — E46 Unspecified protein-calorie malnutrition: Secondary | ICD-10-CM | POA: Diagnosis not present

## 2014-02-27 DIAGNOSIS — E46 Unspecified protein-calorie malnutrition: Secondary | ICD-10-CM | POA: Diagnosis not present

## 2014-02-27 DIAGNOSIS — J189 Pneumonia, unspecified organism: Secondary | ICD-10-CM | POA: Diagnosis not present

## 2014-02-27 DIAGNOSIS — M6281 Muscle weakness (generalized): Secondary | ICD-10-CM | POA: Diagnosis not present

## 2014-02-27 DIAGNOSIS — I259 Chronic ischemic heart disease, unspecified: Secondary | ICD-10-CM | POA: Diagnosis not present

## 2014-02-27 DIAGNOSIS — R1310 Dysphagia, unspecified: Secondary | ICD-10-CM | POA: Diagnosis not present

## 2014-02-27 DIAGNOSIS — J441 Chronic obstructive pulmonary disease with (acute) exacerbation: Secondary | ICD-10-CM | POA: Diagnosis not present

## 2014-02-27 DIAGNOSIS — J9620 Acute and chronic respiratory failure, unspecified whether with hypoxia or hypercapnia: Secondary | ICD-10-CM | POA: Diagnosis not present

## 2014-02-28 DIAGNOSIS — M6281 Muscle weakness (generalized): Secondary | ICD-10-CM | POA: Diagnosis not present

## 2014-02-28 DIAGNOSIS — J9620 Acute and chronic respiratory failure, unspecified whether with hypoxia or hypercapnia: Secondary | ICD-10-CM | POA: Diagnosis not present

## 2014-02-28 DIAGNOSIS — E46 Unspecified protein-calorie malnutrition: Secondary | ICD-10-CM | POA: Diagnosis not present

## 2014-02-28 DIAGNOSIS — J441 Chronic obstructive pulmonary disease with (acute) exacerbation: Secondary | ICD-10-CM | POA: Diagnosis not present

## 2014-02-28 DIAGNOSIS — J189 Pneumonia, unspecified organism: Secondary | ICD-10-CM | POA: Diagnosis not present

## 2014-02-28 DIAGNOSIS — R1310 Dysphagia, unspecified: Secondary | ICD-10-CM | POA: Diagnosis not present

## 2014-03-01 DIAGNOSIS — J441 Chronic obstructive pulmonary disease with (acute) exacerbation: Secondary | ICD-10-CM | POA: Diagnosis not present

## 2014-03-01 DIAGNOSIS — E46 Unspecified protein-calorie malnutrition: Secondary | ICD-10-CM | POA: Diagnosis not present

## 2014-03-01 DIAGNOSIS — R1310 Dysphagia, unspecified: Secondary | ICD-10-CM | POA: Diagnosis not present

## 2014-03-01 DIAGNOSIS — J189 Pneumonia, unspecified organism: Secondary | ICD-10-CM | POA: Diagnosis not present

## 2014-03-01 DIAGNOSIS — J9620 Acute and chronic respiratory failure, unspecified whether with hypoxia or hypercapnia: Secondary | ICD-10-CM | POA: Diagnosis not present

## 2014-03-01 DIAGNOSIS — M6281 Muscle weakness (generalized): Secondary | ICD-10-CM | POA: Diagnosis not present

## 2014-03-04 DIAGNOSIS — J441 Chronic obstructive pulmonary disease with (acute) exacerbation: Secondary | ICD-10-CM | POA: Diagnosis not present

## 2014-03-04 DIAGNOSIS — J189 Pneumonia, unspecified organism: Secondary | ICD-10-CM | POA: Diagnosis not present

## 2014-03-04 DIAGNOSIS — R1310 Dysphagia, unspecified: Secondary | ICD-10-CM | POA: Diagnosis not present

## 2014-03-04 DIAGNOSIS — M6281 Muscle weakness (generalized): Secondary | ICD-10-CM | POA: Diagnosis not present

## 2014-03-04 DIAGNOSIS — J9620 Acute and chronic respiratory failure, unspecified whether with hypoxia or hypercapnia: Secondary | ICD-10-CM | POA: Diagnosis not present

## 2014-03-04 DIAGNOSIS — E46 Unspecified protein-calorie malnutrition: Secondary | ICD-10-CM | POA: Diagnosis not present

## 2014-03-05 DIAGNOSIS — E46 Unspecified protein-calorie malnutrition: Secondary | ICD-10-CM | POA: Diagnosis not present

## 2014-03-05 DIAGNOSIS — M6281 Muscle weakness (generalized): Secondary | ICD-10-CM | POA: Diagnosis not present

## 2014-03-05 DIAGNOSIS — J441 Chronic obstructive pulmonary disease with (acute) exacerbation: Secondary | ICD-10-CM | POA: Diagnosis not present

## 2014-03-05 DIAGNOSIS — J189 Pneumonia, unspecified organism: Secondary | ICD-10-CM | POA: Diagnosis not present

## 2014-03-05 DIAGNOSIS — J9620 Acute and chronic respiratory failure, unspecified whether with hypoxia or hypercapnia: Secondary | ICD-10-CM | POA: Diagnosis not present

## 2014-03-05 DIAGNOSIS — R1310 Dysphagia, unspecified: Secondary | ICD-10-CM | POA: Diagnosis not present

## 2014-03-06 DIAGNOSIS — M6281 Muscle weakness (generalized): Secondary | ICD-10-CM | POA: Diagnosis not present

## 2014-03-06 DIAGNOSIS — J9620 Acute and chronic respiratory failure, unspecified whether with hypoxia or hypercapnia: Secondary | ICD-10-CM | POA: Diagnosis not present

## 2014-03-06 DIAGNOSIS — E46 Unspecified protein-calorie malnutrition: Secondary | ICD-10-CM | POA: Diagnosis not present

## 2014-03-06 DIAGNOSIS — J189 Pneumonia, unspecified organism: Secondary | ICD-10-CM | POA: Diagnosis not present

## 2014-03-06 DIAGNOSIS — R1310 Dysphagia, unspecified: Secondary | ICD-10-CM | POA: Diagnosis not present

## 2014-03-06 DIAGNOSIS — J441 Chronic obstructive pulmonary disease with (acute) exacerbation: Secondary | ICD-10-CM | POA: Diagnosis not present

## 2014-03-07 DIAGNOSIS — G4700 Insomnia, unspecified: Secondary | ICD-10-CM | POA: Diagnosis not present

## 2014-03-07 DIAGNOSIS — J441 Chronic obstructive pulmonary disease with (acute) exacerbation: Secondary | ICD-10-CM | POA: Diagnosis not present

## 2014-03-07 DIAGNOSIS — M6281 Muscle weakness (generalized): Secondary | ICD-10-CM | POA: Diagnosis not present

## 2014-03-07 DIAGNOSIS — R1310 Dysphagia, unspecified: Secondary | ICD-10-CM | POA: Diagnosis not present

## 2014-03-07 DIAGNOSIS — J189 Pneumonia, unspecified organism: Secondary | ICD-10-CM | POA: Diagnosis not present

## 2014-03-07 DIAGNOSIS — Z681 Body mass index (BMI) 19 or less, adult: Secondary | ICD-10-CM | POA: Diagnosis not present

## 2014-03-07 DIAGNOSIS — E46 Unspecified protein-calorie malnutrition: Secondary | ICD-10-CM | POA: Diagnosis not present

## 2014-03-07 DIAGNOSIS — J449 Chronic obstructive pulmonary disease, unspecified: Secondary | ICD-10-CM | POA: Diagnosis not present

## 2014-03-07 DIAGNOSIS — J9620 Acute and chronic respiratory failure, unspecified whether with hypoxia or hypercapnia: Secondary | ICD-10-CM | POA: Diagnosis not present

## 2014-03-08 DIAGNOSIS — M6281 Muscle weakness (generalized): Secondary | ICD-10-CM | POA: Diagnosis not present

## 2014-03-08 DIAGNOSIS — J9620 Acute and chronic respiratory failure, unspecified whether with hypoxia or hypercapnia: Secondary | ICD-10-CM | POA: Diagnosis not present

## 2014-03-08 DIAGNOSIS — E46 Unspecified protein-calorie malnutrition: Secondary | ICD-10-CM | POA: Diagnosis not present

## 2014-03-08 DIAGNOSIS — J189 Pneumonia, unspecified organism: Secondary | ICD-10-CM | POA: Diagnosis not present

## 2014-03-08 DIAGNOSIS — R1310 Dysphagia, unspecified: Secondary | ICD-10-CM | POA: Diagnosis not present

## 2014-03-08 DIAGNOSIS — J441 Chronic obstructive pulmonary disease with (acute) exacerbation: Secondary | ICD-10-CM | POA: Diagnosis not present

## 2014-03-11 DIAGNOSIS — J9620 Acute and chronic respiratory failure, unspecified whether with hypoxia or hypercapnia: Secondary | ICD-10-CM | POA: Diagnosis not present

## 2014-03-11 DIAGNOSIS — E46 Unspecified protein-calorie malnutrition: Secondary | ICD-10-CM | POA: Diagnosis not present

## 2014-03-11 DIAGNOSIS — J441 Chronic obstructive pulmonary disease with (acute) exacerbation: Secondary | ICD-10-CM | POA: Diagnosis not present

## 2014-03-11 DIAGNOSIS — M6281 Muscle weakness (generalized): Secondary | ICD-10-CM | POA: Diagnosis not present

## 2014-03-11 DIAGNOSIS — R1310 Dysphagia, unspecified: Secondary | ICD-10-CM | POA: Diagnosis not present

## 2014-03-11 DIAGNOSIS — J189 Pneumonia, unspecified organism: Secondary | ICD-10-CM | POA: Diagnosis not present

## 2014-03-12 DIAGNOSIS — M6281 Muscle weakness (generalized): Secondary | ICD-10-CM | POA: Diagnosis not present

## 2014-03-12 DIAGNOSIS — J9620 Acute and chronic respiratory failure, unspecified whether with hypoxia or hypercapnia: Secondary | ICD-10-CM | POA: Diagnosis not present

## 2014-03-12 DIAGNOSIS — J189 Pneumonia, unspecified organism: Secondary | ICD-10-CM | POA: Diagnosis not present

## 2014-03-12 DIAGNOSIS — E46 Unspecified protein-calorie malnutrition: Secondary | ICD-10-CM | POA: Diagnosis not present

## 2014-03-12 DIAGNOSIS — J441 Chronic obstructive pulmonary disease with (acute) exacerbation: Secondary | ICD-10-CM | POA: Diagnosis not present

## 2014-03-12 DIAGNOSIS — R1310 Dysphagia, unspecified: Secondary | ICD-10-CM | POA: Diagnosis not present

## 2014-03-13 DIAGNOSIS — J441 Chronic obstructive pulmonary disease with (acute) exacerbation: Secondary | ICD-10-CM | POA: Diagnosis not present

## 2014-03-13 DIAGNOSIS — R1310 Dysphagia, unspecified: Secondary | ICD-10-CM | POA: Diagnosis not present

## 2014-03-13 DIAGNOSIS — M6281 Muscle weakness (generalized): Secondary | ICD-10-CM | POA: Diagnosis not present

## 2014-03-13 DIAGNOSIS — E46 Unspecified protein-calorie malnutrition: Secondary | ICD-10-CM | POA: Diagnosis not present

## 2014-03-13 DIAGNOSIS — J9620 Acute and chronic respiratory failure, unspecified whether with hypoxia or hypercapnia: Secondary | ICD-10-CM | POA: Diagnosis not present

## 2014-03-13 DIAGNOSIS — J189 Pneumonia, unspecified organism: Secondary | ICD-10-CM | POA: Diagnosis not present

## 2014-03-14 DIAGNOSIS — J189 Pneumonia, unspecified organism: Secondary | ICD-10-CM | POA: Diagnosis not present

## 2014-03-14 DIAGNOSIS — J441 Chronic obstructive pulmonary disease with (acute) exacerbation: Secondary | ICD-10-CM | POA: Diagnosis not present

## 2014-03-14 DIAGNOSIS — R1310 Dysphagia, unspecified: Secondary | ICD-10-CM | POA: Diagnosis not present

## 2014-03-14 DIAGNOSIS — J9620 Acute and chronic respiratory failure, unspecified whether with hypoxia or hypercapnia: Secondary | ICD-10-CM | POA: Diagnosis not present

## 2014-03-14 DIAGNOSIS — M6281 Muscle weakness (generalized): Secondary | ICD-10-CM | POA: Diagnosis not present

## 2014-03-14 DIAGNOSIS — E46 Unspecified protein-calorie malnutrition: Secondary | ICD-10-CM | POA: Diagnosis not present

## 2014-03-15 DIAGNOSIS — R1310 Dysphagia, unspecified: Secondary | ICD-10-CM | POA: Diagnosis not present

## 2014-03-15 DIAGNOSIS — M6281 Muscle weakness (generalized): Secondary | ICD-10-CM | POA: Diagnosis not present

## 2014-03-15 DIAGNOSIS — J441 Chronic obstructive pulmonary disease with (acute) exacerbation: Secondary | ICD-10-CM | POA: Diagnosis not present

## 2014-03-15 DIAGNOSIS — J9620 Acute and chronic respiratory failure, unspecified whether with hypoxia or hypercapnia: Secondary | ICD-10-CM | POA: Diagnosis not present

## 2014-03-15 DIAGNOSIS — J189 Pneumonia, unspecified organism: Secondary | ICD-10-CM | POA: Diagnosis not present

## 2014-03-15 DIAGNOSIS — E46 Unspecified protein-calorie malnutrition: Secondary | ICD-10-CM | POA: Diagnosis not present

## 2014-03-18 DIAGNOSIS — R1310 Dysphagia, unspecified: Secondary | ICD-10-CM | POA: Diagnosis not present

## 2014-03-18 DIAGNOSIS — M6281 Muscle weakness (generalized): Secondary | ICD-10-CM | POA: Diagnosis not present

## 2014-03-18 DIAGNOSIS — E46 Unspecified protein-calorie malnutrition: Secondary | ICD-10-CM | POA: Diagnosis not present

## 2014-03-18 DIAGNOSIS — J441 Chronic obstructive pulmonary disease with (acute) exacerbation: Secondary | ICD-10-CM | POA: Diagnosis not present

## 2014-03-18 DIAGNOSIS — J189 Pneumonia, unspecified organism: Secondary | ICD-10-CM | POA: Diagnosis not present

## 2014-03-18 DIAGNOSIS — J9620 Acute and chronic respiratory failure, unspecified whether with hypoxia or hypercapnia: Secondary | ICD-10-CM | POA: Diagnosis not present

## 2014-03-20 ENCOUNTER — Telehealth: Payer: Self-pay | Admitting: Pulmonary Disease

## 2014-03-20 DIAGNOSIS — E46 Unspecified protein-calorie malnutrition: Secondary | ICD-10-CM | POA: Diagnosis not present

## 2014-03-20 DIAGNOSIS — J9620 Acute and chronic respiratory failure, unspecified whether with hypoxia or hypercapnia: Secondary | ICD-10-CM | POA: Diagnosis not present

## 2014-03-20 DIAGNOSIS — J441 Chronic obstructive pulmonary disease with (acute) exacerbation: Secondary | ICD-10-CM | POA: Diagnosis not present

## 2014-03-20 DIAGNOSIS — J189 Pneumonia, unspecified organism: Secondary | ICD-10-CM | POA: Diagnosis not present

## 2014-03-20 DIAGNOSIS — M6281 Muscle weakness (generalized): Secondary | ICD-10-CM | POA: Diagnosis not present

## 2014-03-20 DIAGNOSIS — R1310 Dysphagia, unspecified: Secondary | ICD-10-CM | POA: Diagnosis not present

## 2014-03-20 NOTE — Telephone Encounter (Signed)
Spoke with Selena Batten with [facility]  She states that she visited the pt today, and pt c/o nasal congestion and sinus pain  She states 02 sats dropping into the mid 80's on 3lpm  Selena Batten states that the pt already has ov with TP for tomorrow, which I confirmed  She will advise him to keep this appt and call sooner if needed  Will forward to RA as FYI

## 2014-03-21 ENCOUNTER — Ambulatory Visit (INDEPENDENT_AMBULATORY_CARE_PROVIDER_SITE_OTHER): Payer: Medicare Other | Admitting: Adult Health

## 2014-03-21 ENCOUNTER — Encounter: Payer: Self-pay | Admitting: Adult Health

## 2014-03-21 VITALS — BP 128/66 | HR 85 | Temp 98.2°F | Ht 69.0 in | Wt 129.2 lb

## 2014-03-21 DIAGNOSIS — R0600 Dyspnea, unspecified: Secondary | ICD-10-CM

## 2014-03-21 DIAGNOSIS — E46 Unspecified protein-calorie malnutrition: Secondary | ICD-10-CM | POA: Diagnosis not present

## 2014-03-21 DIAGNOSIS — R0989 Other specified symptoms and signs involving the circulatory and respiratory systems: Secondary | ICD-10-CM | POA: Diagnosis not present

## 2014-03-21 DIAGNOSIS — J441 Chronic obstructive pulmonary disease with (acute) exacerbation: Secondary | ICD-10-CM | POA: Diagnosis not present

## 2014-03-21 DIAGNOSIS — R1310 Dysphagia, unspecified: Secondary | ICD-10-CM | POA: Diagnosis not present

## 2014-03-21 DIAGNOSIS — R0609 Other forms of dyspnea: Secondary | ICD-10-CM

## 2014-03-21 DIAGNOSIS — J189 Pneumonia, unspecified organism: Secondary | ICD-10-CM | POA: Diagnosis not present

## 2014-03-21 DIAGNOSIS — J9620 Acute and chronic respiratory failure, unspecified whether with hypoxia or hypercapnia: Secondary | ICD-10-CM | POA: Diagnosis not present

## 2014-03-21 DIAGNOSIS — I2510 Atherosclerotic heart disease of native coronary artery without angina pectoris: Secondary | ICD-10-CM | POA: Diagnosis not present

## 2014-03-21 DIAGNOSIS — M6281 Muscle weakness (generalized): Secondary | ICD-10-CM | POA: Diagnosis not present

## 2014-03-21 MED ORDER — LEVALBUTEROL HCL 0.63 MG/3ML IN NEBU
0.6300 mg | INHALATION_SOLUTION | Freq: Once | RESPIRATORY_TRACT | Status: AC
  Administered 2014-03-21: 0.63 mg via RESPIRATORY_TRACT

## 2014-03-21 MED ORDER — DOXYCYCLINE HYCLATE 100 MG PO TABS: 100.0000 mg | ORAL_TABLET | Freq: Two times a day (BID) | ORAL | Status: DC

## 2014-03-21 MED ORDER — PREDNISONE 10 MG PO TABS: ORAL_TABLET | ORAL | Status: DC

## 2014-03-21 NOTE — Assessment & Plan Note (Signed)
Exacerbation   Plan  Doxycycline 100mg  Twice daily  For 7 days  Prednisone taper over next week  Mucinex DM Twice daily  As needed  Cough/congesiton  Fluids and rest  Please contact office for sooner follow up if symptoms do not improve or worsen or seek emergency care  Follow up Dr. Vassie Loll  3 months and  As needed

## 2014-03-21 NOTE — Patient Instructions (Signed)
Doxycycline 100mg  Twice daily  For 7 days  Prednisone taper over next week  Mucinex DM Twice daily  As needed  Cough/congesiton  Fluids and rest  Please contact office for sooner follow up if symptoms do not improve or worsen or seek emergency care  Follow up Dr. Vassie Loll  3 months and  As needed

## 2014-03-21 NOTE — Progress Notes (Signed)
   Subjective:    Patient ID: [name], male    DOB: 1936-03-04, 78 y.o.   MRN: [id number]  HPI PMD - [name]   77/M, ex- smoker- severe COPD on nocturnal O2 since 5/11 & cough.  5/10 PFTs >> severe obstruction, FEV1 39%, no BD response  He developed a chronic cough.since his heart surgery by [name] in 6/09  Barrett's esophagus 3 cm noted to be stable by [name].  H/o sinus surgery, tonsillectomy  CT chest 4/09 severe emphysema, scattered sub-cm nodules stable since Feb 13, 2004  CT chest 11/16/10 neg PE, stable nodules, small RLL infx Rxed for HCAP with cipro & zosyn  Modified Ba swallow nml  Ct angio 2/12 did not show any suspicious nodules  11/04/2011 Started on Daliresp by Primary MD - stopped 4/13, makes him 'mean' , lost wt ,  4/13 ono on RA showed only 2 min desatn < 88% - off O2  06/2013 - Uses O2 at night, does not need much in daytime    Hosp adm in 2/12 >> for cough syncope - admitted to tele but transferred to Feb 12, 2899 for another syncopal event  11/27/12 AECOPD/CAP admission 07/05/13  - graduated from pulm rehab      10/02/13 Acute OV -zpak/pred  10/2013 doxycycline.  His wife was diagnosed with stage IV rectal cancer. She's been in and out of the hospital.  He denies any hemoptysis, orthopnea, PND, leg swelling. Patient states that over the last week. Congestion. Has worsenedWith worsening sinus congestion, and wheezing. Last night.  He denies any recent travel or antibiotic use. He's had no emergency room visits or hospitalizations. Over the last 6 months.   Improved , then c/o diarrhea x 1 d Good & bad days with breathing Cough always there  03/21/2014 Follow up (ex smoker- severe COPD on nocturnal O2 ) Returns for  3 month COPD follow up .  Was doing well with home health until neice did some cleaning yesterday (fumes) -  Complains of  increased SOB, wheezing, tightness, prod cough w/ light yellow mucus for last 2 days.  Denies f/c/s, hemoptysis, PND, leg swelling,  orthopnea, nausea, vomiting.   Spouse passed away February 12, 2014 Was admitted 4/13 for COPD flare .    Review of Systems  neg for any significant sore throat, dysphagia, itching, sneezing, nasal congestion or excess/ purulent secretions, fever, chills, sweats, unintended wt loss, pleuritic or exertional cp, hempoptysis, orthopnea pnd or change in chronic leg swelling. Also denies presyncope, palpitations, heartburn, abdominal pain, nausea, vomiting, diarrhea or change in bowel or urinary habits, dysuria,hematuria, rash, arthralgias, visual complaints, headache, numbness weakness or ataxia.     Objective:   Physical Exam   Gen. Pleasant, eldelry  in no distress ENT - no lesions, no post nasal drip Neck: No JVD, no thyromegaly, no carotid bruits Lungs: no use of accessory muscles, no dullness to percussion, few rhonchi and scant exp wheezes  Cardiovascular: Rhythm regular, heart sounds  normal, no murmurs or gallops, no peripheral edema Musculoskeletal: No deformities, no cyanosis or clubbing        Assessment & Plan:

## 2014-03-25 ENCOUNTER — Telehealth: Payer: Self-pay | Admitting: Pulmonary Disease

## 2014-03-25 DIAGNOSIS — J441 Chronic obstructive pulmonary disease with (acute) exacerbation: Secondary | ICD-10-CM | POA: Diagnosis not present

## 2014-03-25 DIAGNOSIS — J9620 Acute and chronic respiratory failure, unspecified whether with hypoxia or hypercapnia: Secondary | ICD-10-CM | POA: Diagnosis not present

## 2014-03-25 DIAGNOSIS — E46 Unspecified protein-calorie malnutrition: Secondary | ICD-10-CM | POA: Diagnosis not present

## 2014-03-25 DIAGNOSIS — R1310 Dysphagia, unspecified: Secondary | ICD-10-CM | POA: Diagnosis not present

## 2014-03-25 DIAGNOSIS — M6281 Muscle weakness (generalized): Secondary | ICD-10-CM | POA: Diagnosis not present

## 2014-03-25 DIAGNOSIS — J189 Pneumonia, unspecified organism: Secondary | ICD-10-CM | POA: Diagnosis not present

## 2014-03-25 NOTE — Telephone Encounter (Signed)
Called spoke with Selena Batten from [facility]. She was calling to let us know that pt's potassium interacts with 3 of pt medications 1) combivent inhaler--possible GI erosion 2) spiriva-- possible GI erosion 3) amlodipine-- increase hyperkalemia  Please advise  TP thanks

## 2014-03-25 NOTE — Telephone Encounter (Signed)
Per TP: noted.  Thank you.

## 2014-03-26 DIAGNOSIS — R1310 Dysphagia, unspecified: Secondary | ICD-10-CM | POA: Diagnosis not present

## 2014-03-26 DIAGNOSIS — J441 Chronic obstructive pulmonary disease with (acute) exacerbation: Secondary | ICD-10-CM | POA: Diagnosis not present

## 2014-03-26 DIAGNOSIS — J189 Pneumonia, unspecified organism: Secondary | ICD-10-CM | POA: Diagnosis not present

## 2014-03-26 DIAGNOSIS — E46 Unspecified protein-calorie malnutrition: Secondary | ICD-10-CM | POA: Diagnosis not present

## 2014-03-26 DIAGNOSIS — J9620 Acute and chronic respiratory failure, unspecified whether with hypoxia or hypercapnia: Secondary | ICD-10-CM | POA: Diagnosis not present

## 2014-03-26 DIAGNOSIS — M6281 Muscle weakness (generalized): Secondary | ICD-10-CM | POA: Diagnosis not present

## 2014-03-26 NOTE — Progress Notes (Signed)
Reviewed & agree with plan  

## 2014-03-27 DIAGNOSIS — E46 Unspecified protein-calorie malnutrition: Secondary | ICD-10-CM | POA: Diagnosis not present

## 2014-03-27 DIAGNOSIS — R1310 Dysphagia, unspecified: Secondary | ICD-10-CM | POA: Diagnosis not present

## 2014-03-27 DIAGNOSIS — J189 Pneumonia, unspecified organism: Secondary | ICD-10-CM | POA: Diagnosis not present

## 2014-03-27 DIAGNOSIS — M6281 Muscle weakness (generalized): Secondary | ICD-10-CM | POA: Diagnosis not present

## 2014-03-27 DIAGNOSIS — J9620 Acute and chronic respiratory failure, unspecified whether with hypoxia or hypercapnia: Secondary | ICD-10-CM | POA: Diagnosis not present

## 2014-03-27 DIAGNOSIS — J441 Chronic obstructive pulmonary disease with (acute) exacerbation: Secondary | ICD-10-CM | POA: Diagnosis not present

## 2014-03-28 DIAGNOSIS — R1310 Dysphagia, unspecified: Secondary | ICD-10-CM | POA: Diagnosis not present

## 2014-03-28 DIAGNOSIS — M6281 Muscle weakness (generalized): Secondary | ICD-10-CM | POA: Diagnosis not present

## 2014-03-28 DIAGNOSIS — J9620 Acute and chronic respiratory failure, unspecified whether with hypoxia or hypercapnia: Secondary | ICD-10-CM | POA: Diagnosis not present

## 2014-03-28 DIAGNOSIS — J441 Chronic obstructive pulmonary disease with (acute) exacerbation: Secondary | ICD-10-CM | POA: Diagnosis not present

## 2014-03-28 DIAGNOSIS — E46 Unspecified protein-calorie malnutrition: Secondary | ICD-10-CM | POA: Diagnosis not present

## 2014-03-28 DIAGNOSIS — J189 Pneumonia, unspecified organism: Secondary | ICD-10-CM | POA: Diagnosis not present

## 2014-04-03 DIAGNOSIS — E46 Unspecified protein-calorie malnutrition: Secondary | ICD-10-CM | POA: Diagnosis not present

## 2014-04-03 DIAGNOSIS — J449 Chronic obstructive pulmonary disease, unspecified: Secondary | ICD-10-CM | POA: Diagnosis not present

## 2014-04-03 DIAGNOSIS — J9620 Acute and chronic respiratory failure, unspecified whether with hypoxia or hypercapnia: Secondary | ICD-10-CM | POA: Diagnosis not present

## 2014-04-03 DIAGNOSIS — Z681 Body mass index (BMI) 19 or less, adult: Secondary | ICD-10-CM | POA: Diagnosis not present

## 2014-04-03 DIAGNOSIS — M6281 Muscle weakness (generalized): Secondary | ICD-10-CM | POA: Diagnosis not present

## 2014-04-03 DIAGNOSIS — I10 Essential (primary) hypertension: Secondary | ICD-10-CM | POA: Diagnosis not present

## 2014-04-03 DIAGNOSIS — J189 Pneumonia, unspecified organism: Secondary | ICD-10-CM | POA: Diagnosis not present

## 2014-04-03 DIAGNOSIS — R1310 Dysphagia, unspecified: Secondary | ICD-10-CM | POA: Diagnosis not present

## 2014-04-03 DIAGNOSIS — J441 Chronic obstructive pulmonary disease with (acute) exacerbation: Secondary | ICD-10-CM | POA: Diagnosis not present

## 2014-04-03 DIAGNOSIS — I2510 Atherosclerotic heart disease of native coronary artery without angina pectoris: Secondary | ICD-10-CM | POA: Diagnosis not present

## 2014-04-26 ENCOUNTER — Telehealth: Payer: Self-pay | Admitting: Pulmonary Disease

## 2014-04-26 DIAGNOSIS — J441 Chronic obstructive pulmonary disease with (acute) exacerbation: Secondary | ICD-10-CM | POA: Diagnosis not present

## 2014-04-26 DIAGNOSIS — R1310 Dysphagia, unspecified: Secondary | ICD-10-CM | POA: Diagnosis not present

## 2014-04-26 DIAGNOSIS — J189 Pneumonia, unspecified organism: Secondary | ICD-10-CM | POA: Diagnosis not present

## 2014-04-26 DIAGNOSIS — E46 Unspecified protein-calorie malnutrition: Secondary | ICD-10-CM | POA: Diagnosis not present

## 2014-04-26 DIAGNOSIS — J9620 Acute and chronic respiratory failure, unspecified whether with hypoxia or hypercapnia: Secondary | ICD-10-CM | POA: Diagnosis not present

## 2014-04-26 DIAGNOSIS — J449 Chronic obstructive pulmonary disease, unspecified: Secondary | ICD-10-CM

## 2014-04-26 DIAGNOSIS — M6281 Muscle weakness (generalized): Secondary | ICD-10-CM | POA: Diagnosis not present

## 2014-04-26 MED ORDER — TIOTROPIUM BROMIDE MONOHYDRATE 18 MCG IN CAPS: 18.0000 ug | ORAL_CAPSULE | Freq: Every day | RESPIRATORY_TRACT | Status: DC

## 2014-04-26 NOTE — Telephone Encounter (Signed)
Spoke with pt. Aware samples left for pick up. Nothing further needed 

## 2014-05-21 ENCOUNTER — Ambulatory Visit (INDEPENDENT_AMBULATORY_CARE_PROVIDER_SITE_OTHER): Payer: Medicare Other | Admitting: Cardiovascular Disease

## 2014-05-21 ENCOUNTER — Encounter: Payer: Self-pay | Admitting: Cardiovascular Disease

## 2014-05-21 VITALS — BP 158/70 | HR 86 | Ht 68.0 in | Wt 144.0 lb

## 2014-05-21 DIAGNOSIS — I25810 Atherosclerosis of coronary artery bypass graft(s) without angina pectoris: Secondary | ICD-10-CM | POA: Diagnosis not present

## 2014-05-21 DIAGNOSIS — E785 Hyperlipidemia, unspecified: Secondary | ICD-10-CM | POA: Diagnosis not present

## 2014-05-21 DIAGNOSIS — J449 Chronic obstructive pulmonary disease, unspecified: Secondary | ICD-10-CM | POA: Diagnosis not present

## 2014-05-21 DIAGNOSIS — I10 Essential (primary) hypertension: Secondary | ICD-10-CM | POA: Diagnosis not present

## 2014-05-21 DIAGNOSIS — I471 Supraventricular tachycardia: Secondary | ICD-10-CM

## 2014-05-21 DIAGNOSIS — I498 Other specified cardiac arrhythmias: Secondary | ICD-10-CM

## 2014-05-21 DIAGNOSIS — I2510 Atherosclerotic heart disease of native coronary artery without angina pectoris: Secondary | ICD-10-CM | POA: Diagnosis not present

## 2014-05-21 NOTE — Patient Instructions (Signed)
Your physician wants you to follow-up in: 6 months You will receive a reminder letter in the mail two months in advance. If you don't receive a letter, please call our office to schedule the follow-up appointment.   Please record Blood pressure readings at home and drop log off back to office in 1 month      Your physician recommends that you continue on your current medications as directed. Please refer to the Current Medication list given to you today.     Thank you for choosing [facility] !

## 2014-05-21 NOTE — Progress Notes (Signed)
Patient ID: [name], male   DOB: February 08, 1936, 78 y.o.   MRN: [id number]      SUBJECTIVE: The patient presents for routine cardiovascular followup. He has a history of coronary artery bypass graft surgery in 2009, hypertension, hyperlipidemia, PSVT, and severe COPD requiring oxygen at night while sleeping. His wife passed away earlier this year. He was hospitalized for a COPD exacerbation in April. He is here with his son. He denies chest pain. His breathing is stable but he becomes more short of breath if he tries to walk outside when it is very hot and humid. Leg swelling has also been stable. He rarely checks his BP, but thinks it may have been elevated the last time he checked it. He is due for a complete physical with Dr. Sherwood Gambler (PCP) next week.   Review of Systems: As per "subjective", otherwise negative.  No Known Allergies  Current Outpatient Prescriptions  Medication Sig Dispense Refill  . acetaminophen (TYLENOL) 500 MG tablet Take 500 mg by mouth every 6 (six) hours as needed for pain.      Marland Kitchen albuterol (PROVENTIL HFA;VENTOLIN HFA) 108 (90 BASE) MCG/ACT inhaler Inhale 2 puffs into the lungs every 6 (six) hours as needed for wheezing or shortness of breath.      Marland Kitchen albuterol (PROVENTIL) (2.5 MG/3ML) 0.083% nebulizer solution Take 3 mLs (2.5 mg total) by nebulization every 6 (six) hours.  75 mL  12  . ALPRAZolam (XANAX) 0.25 MG tablet Take 1 tablet (0.25 mg total) by mouth at bedtime as needed for sleep.  30 tablet  0  . amLODipine (NORVASC) 10 MG tablet Take 5 mg by mouth daily.       Marland Kitchen aspirin 81 MG tablet Take 81 mg by mouth daily.      Marland Kitchen docusate sodium (COLACE) 100 MG capsule Take 200 mg by mouth at bedtime as needed for constipation.       . Fluticasone Furoate-Vilanterol 100-25 MCG/INH AEPB Inhale 1 puff into the lungs daily.  3 each  0  . furosemide (LASIX) 20 MG tablet Take 1 tablet by mouth daily.       Marland Kitchen guaiFENesin (MUCINEX) 600 MG 12 hr tablet Take 2 tablets  (1,200 mg total) by mouth 2 (two) times daily.  14 tablet  0  . KLOR-CON M10 10 MEQ tablet Take 1 tablet by mouth daily.      . Omega-3 Fatty Acids (EQL FISH OIL) 1000 MG CAPS Take 2 capsules (2,000 mg total) by mouth daily.  60 capsule  3  . omeprazole (PRILOSEC) 20 MG capsule Take 20 mg by mouth daily. 30 minutes before breakfast      . PARoxetine (PAXIL) 10 MG tablet Take 1 tablet by mouth every morning.      . polyethylene glycol (MIRALAX / GLYCOLAX) packet Take 17 g by mouth daily.  14 each  0  . pravastatin (PRAVACHOL) 80 MG tablet Take 80 mg by mouth at bedtime.      Marland Kitchen tiotropium (SPIRIVA) 18 MCG inhalation capsule Place 1 capsule (18 mcg total) into inhaler and inhale daily.  30 capsule  0   No current facility-administered medications for this visit.    Past Medical History  Diagnosis Date  . Hypertension   . Hyperlipidemia     excellent control  . Chronic kidney disease, stage III (moderate)   . Barrett's esophagus     frequent surveillance EGDs by Dr. Jena Gauss  . Chronic obstructive pulmonary disease  severe emphysema  . Cough, persistent   . Hyperkalemia     peak of 6.2 in 2009  . Colonic polyp     resected 2007 via colonoscope  . Diverticulosis   . Pneumonia     10/2010  . Coronary artery disease     CABG in 03/2008 for severe left main disease with critical RCA stenosis; EF of 45-50% with inferior wall hypokinesis; echocardiogram in 10/2008-no change in LV function; stress nuclear-conflicting information in report indicating inferior reversibility but inferior scar  . Gastroesophageal reflux disease   . Peptic ulcer disease     perforation and surgical repair at [facility] in 1999; treated for positive Helicobacter pylori  . History of orthostatic hypotension     noted following CABG surgery  . COPD (chronic obstructive pulmonary disease)     Past Surgical History  Procedure Laterality Date  . Laparoscopic gastrotomy w/ repair of ulcer      1999  . Coronary artery  bypass graft  03/2008  . Colon surgery    . Cardiac surgery      History   Social History  . Marital Status: Married    Spouse Name: N/A    Number of Children: 2  . Years of Education: N/A   Occupational History  . retired     Animal nutritionist   Social History Main Topics  . Smoking status: Former Smoker -- 1.00 packs/day for 20 years    Types: Cigarettes, Pipe, Cigars    Quit date: 01/12/1998  . Smokeless tobacco: Never Used  . Alcohol Use: Yes     Comment: rare wine  . Drug Use: No  . Sexual Activity: Not on file   Other Topics Concern  . Not on file   Social History Narrative  . No narrative on file     Filed Vitals:   05/21/14 1258  BP: 158/70  Pulse: 86  Height:  (1.727 m)  Weight: 144 lb (65.318 kg)    PHYSICAL EXAM General: NAD  Neck: No JVD, no thyromegaly or thyroid nodule.  Lungs: Diminished b/l, no rales. CV: Nondisplaced PMI. Heart regular but distant tones. Normal S1/S2, no S3/S4, no murmur. 1+pitting pretibial/periankle edema.  Abdomen: Soft, nontender, no hepatosplenomegaly, no distention.  Neurologic: Alert and oriented x 3.  Psych: Normal affect.  Extremities: No clubbing or cyanosis.   ECG: reviewed and available in electronic records.      ASSESSMENT AND PLAN: 1. CAD with h/o CABG: Symptomatically stable. Continue current therapy which includes ASA and statin. Not a candidate for beta blocker due to severe COPD.  2. HTN: Mildly elevated. Renal function stable when last checked on 02/06/14. I've asked him to check BP three times per week for the next month, and to inform me of the results. If it remains elevated, I will start lisinopril 5 mg daily. Already taking amlodipine 10 mg daily.  3. Hyperlipidemia: Due to have lipids and LFT's checked by PCP next week. Will obtain these results.  4. PSVT: Symptomatically stable on current therapy.  Dispo: f/u 6 months.  Prentice Docker, M.D., F.A.C.C.

## 2014-05-30 ENCOUNTER — Telehealth: Payer: Self-pay | Admitting: Pulmonary Disease

## 2014-05-30 DIAGNOSIS — J449 Chronic obstructive pulmonary disease, unspecified: Secondary | ICD-10-CM

## 2014-05-30 MED ORDER — FLUTICASONE FUROATE-VILANTEROL 100-25 MCG/INH IN AEPB: 1.0000 | INHALATION_SPRAY | Freq: Every day | RESPIRATORY_TRACT | Status: DC

## 2014-05-30 MED ORDER — TIOTROPIUM BROMIDE MONOHYDRATE 18 MCG IN CAPS: 18.0000 ug | ORAL_CAPSULE | Freq: Every day | RESPIRATORY_TRACT | Status: DC

## 2014-05-30 NOTE — Telephone Encounter (Signed)
Pt aware samples of both left for pick up. Nothing further needed

## 2014-05-31 DIAGNOSIS — F3289 Other specified depressive episodes: Secondary | ICD-10-CM | POA: Diagnosis not present

## 2014-05-31 DIAGNOSIS — F329 Major depressive disorder, single episode, unspecified: Secondary | ICD-10-CM | POA: Diagnosis not present

## 2014-05-31 DIAGNOSIS — J449 Chronic obstructive pulmonary disease, unspecified: Secondary | ICD-10-CM | POA: Diagnosis not present

## 2014-05-31 DIAGNOSIS — Z23 Encounter for immunization: Secondary | ICD-10-CM | POA: Diagnosis not present

## 2014-05-31 DIAGNOSIS — Z6841 Body Mass Index (BMI) 40.0 and over, adult: Secondary | ICD-10-CM | POA: Diagnosis not present

## 2014-05-31 DIAGNOSIS — Z0000 Encounter for general adult medical examination without abnormal findings: Secondary | ICD-10-CM | POA: Diagnosis not present

## 2014-07-01 ENCOUNTER — Telehealth: Payer: Self-pay | Admitting: Pulmonary Disease

## 2014-07-01 DIAGNOSIS — J449 Chronic obstructive pulmonary disease, unspecified: Secondary | ICD-10-CM

## 2014-07-01 MED ORDER — FLUTICASONE FUROATE-VILANTEROL 100-25 MCG/INH IN AEPB: 1.0000 | INHALATION_SPRAY | Freq: Every day | RESPIRATORY_TRACT | Status: DC

## 2014-07-01 MED ORDER — TIOTROPIUM BROMIDE MONOHYDRATE 18 MCG IN CAPS: 18.0000 ug | ORAL_CAPSULE | Freq: Every day | RESPIRATORY_TRACT | Status: DC

## 2014-07-01 NOTE — Telephone Encounter (Signed)
Called pt. Spoke with son. Aware samples are left for p/u. Nothing further needed

## 2014-07-05 DIAGNOSIS — H18419 Arcus senilis, unspecified eye: Secondary | ICD-10-CM | POA: Diagnosis not present

## 2014-07-05 DIAGNOSIS — Z961 Presence of intraocular lens: Secondary | ICD-10-CM | POA: Diagnosis not present

## 2014-07-05 DIAGNOSIS — H02839 Dermatochalasis of unspecified eye, unspecified eyelid: Secondary | ICD-10-CM | POA: Diagnosis not present

## 2014-07-05 DIAGNOSIS — H3530 Unspecified macular degeneration: Secondary | ICD-10-CM | POA: Diagnosis not present

## 2014-07-10 DIAGNOSIS — Z23 Encounter for immunization: Secondary | ICD-10-CM | POA: Diagnosis not present

## 2014-07-24 ENCOUNTER — Telehealth: Payer: Self-pay | Admitting: Pulmonary Disease

## 2014-07-24 MED ORDER — TIOTROPIUM BROMIDE MONOHYDRATE 18 MCG IN CAPS: 18.0000 ug | ORAL_CAPSULE | Freq: Every day | RESPIRATORY_TRACT | Status: DC

## 2014-07-24 MED ORDER — FLUTICASONE FUROATE-VILANTEROL 100-25 MCG/INH IN AEPB: 1.0000 | INHALATION_SPRAY | Freq: Every day | RESPIRATORY_TRACT | Status: AC

## 2014-07-24 NOTE — Telephone Encounter (Signed)
Pt last seen 6.4.15 by TP, recommended to follow up in 3 months >> no pending appts Called spoke with patient, advised will be happy to provide with samples but needs ov w/ RA Pt stated that his son schedules his appts (d/t to transportation) but son is not there at this time.  Pt will have son call the office to schedule appt Samples provided, documented per protocol and placed up front  Nothing further needed at this time; will sign off.

## 2014-07-25 DIAGNOSIS — E871 Hypo-osmolality and hyponatremia: Secondary | ICD-10-CM | POA: Diagnosis not present

## 2014-07-25 DIAGNOSIS — I429 Cardiomyopathy, unspecified: Secondary | ICD-10-CM | POA: Diagnosis not present

## 2014-07-25 DIAGNOSIS — I10 Essential (primary) hypertension: Secondary | ICD-10-CM | POA: Diagnosis not present

## 2014-07-25 DIAGNOSIS — N182 Chronic kidney disease, stage 2 (mild): Secondary | ICD-10-CM | POA: Diagnosis not present

## 2014-08-01 ENCOUNTER — Encounter: Payer: Self-pay | Admitting: Adult Health

## 2014-08-01 ENCOUNTER — Ambulatory Visit (INDEPENDENT_AMBULATORY_CARE_PROVIDER_SITE_OTHER): Payer: Medicare Other | Admitting: Adult Health

## 2014-08-01 VITALS — BP 126/74 | HR 82 | Temp 96.9°F | Ht 68.0 in | Wt 146.4 lb

## 2014-08-01 DIAGNOSIS — I2510 Atherosclerotic heart disease of native coronary artery without angina pectoris: Secondary | ICD-10-CM

## 2014-08-01 DIAGNOSIS — J449 Chronic obstructive pulmonary disease, unspecified: Secondary | ICD-10-CM

## 2014-08-01 NOTE — Assessment & Plan Note (Signed)
Compensated on present regimen  Flu /PVX and Prevnar utd   Plan  Continue on Breo 1 puff daily , brush rinse and gargle after use.  Continue on Spiriva daily, brush rinse and gargle after use. Followup with Dr. Vassie LollAlva in 4  months and as needed. Please contact office for sooner follow up if symptoms do not improve or worsen or seek emergency care

## 2014-08-01 NOTE — Progress Notes (Addendum)
Subjective:    Patient ID: Joseph CraftWayne J Pavlovic, male    DOB: 08/13/1936, 78 y.o.   MRN: [id number]  HPI PMD - [name]   77/M, ex- smoker- severe COPD on nocturnal O2 since 5/11 & cough.  5/10 PFTs >> severe obstruction, FEV1 39%, no BD response  He developed a chronic cough.since his heart surgery by Dr Cornelius Moraswen in 6/09  Barrett's esophagus 3 cm noted to be stable by Dr Jena Gaussourk.  H/o sinus surgery, tonsillectomy  CT chest 4/09 severe emphysema, scattered sub-cm nodules stable since 2005  CT chest 11/16/10 neg PE, stable nodules, small RLL infx Rxed for HCAP with cipro & zosyn  Modified Ba swallow nml  Ct angio 2/12 did not show any suspicious nodules  11/04/2011 Started on Daliresp by Primary MD - stopped 4/13, makes him 'mean' , lost wt ,  4/13 ono on RA showed only 2 min desatn < 88% - off O2  06/2013 - Uses O2 at night, does not need much in daytime    Hosp adm in 2/12 >> for cough syncope - admitted to tele but transferred to 2900 for another syncopal event  11/27/12 AECOPD/CAP admission 07/05/13  - graduated from pulm rehab      10/02/13 Acute OV -zpak/pred  10/2013 doxycycline.  His wife was diagnosed with stage IV rectal cancer. She's been in and out of the hospital.  He denies any hemoptysis, orthopnea, PND, leg swelling. Patient states that over the last week. Congestion. Has worsenedWith worsening sinus congestion, and wheezing. Last night.  He denies any recent travel or antibiotic use. He's had no emergency room visits or hospitalizations. Over the last 6 months.   Improved , then c/o diarrhea x 1 d Good & bad days with breathing Cough always there  03/21/14  Follow up (ex smoker- severe COPD on nocturnal O2 ) Returns for  3 month COPD follow up .  Was doing well with home health until neice did some cleaning yesterday (fumes) -  Complains of  increased SOB, wheezing, tightness, prod cough w/ light yellow mucus for last 2 days.  Denies f/c/s, hemoptysis, PND, leg swelling,  orthopnea, nausea, vomiting.   Spouse passed away 01/2014 Was admitted 4/13 for COPD flare .   08/01/2014 Follow up (ex smoker- severe COPD on nocturnal O2 ) Returns for a 4 month COPD follow up  Reports breathing is doing well, at baseline.  no new complaints. Flu shot utd . Prevnar and PVX are utd  Remains on BREO and Spiriva.  No ER or hospital follow up  CXR 01/2014 w/ chronic changes, no acute process.  No fever, chest pain, orthopnea, edema or n/v/d  No hemoptysis.    Review of Systems  neg for any significant sore throat, dysphagia, itching, sneezing, nasal congestion or excess/ purulent secretions, fever, chills, sweats, unintended wt loss, pleuritic or exertional cp, hempoptysis, orthopnea pnd or change in chronic leg swelling. Also denies presyncope, palpitations, heartburn, abdominal pain, nausea, vomiting, diarrhea or change in bowel or urinary habits, dysuria,hematuria, rash, arthralgias, visual complaints, headache, numbness weakness or ataxia.     Objective:   Physical Exam   Gen. Pleasant, eldelry  in no distress ENT - no lesions, no post nasal drip Neck: No JVD, no thyromegaly, no carotid bruits Lungs: no use of accessory muscles, no dullness to percussion, decreased BS in bases , no wheezing noted  Cardiovascular: Rhythm regular, heart sounds  normal, no murmurs or gallops, no peripheral edema Musculoskeletal: No deformities, no cyanosis  or clubbing        Assessment & Plan:

## 2014-08-01 NOTE — Patient Instructions (Signed)
Continue on Breo 1 puff daily , brush rinse and gargle after use.  Continue on Spiriva daily, brush rinse and gargle after use. Followup with Dr. Vassie LollAlva in 4  months and as needed. Please contact office for sooner follow up if symptoms do not improve or worsen or seek emergency care

## 2014-08-05 NOTE — Progress Notes (Signed)
Reviewed & agree with plan  

## 2014-08-12 ENCOUNTER — Telehealth: Payer: Self-pay | Admitting: Pulmonary Disease

## 2014-08-12 MED ORDER — TIOTROPIUM BROMIDE MONOHYDRATE 2.5 MCG/ACT IN AERS: 2.0000 | INHALATION_SPRAY | Freq: Every day | RESPIRATORY_TRACT | Status: DC

## 2014-08-12 NOTE — Telephone Encounter (Signed)
Spoke with pt, he is aware of spiriva samples left up front for him.  Nothing further needed.

## 2014-11-06 ENCOUNTER — Telehealth: Payer: Self-pay | Admitting: Pulmonary Disease

## 2014-11-06 MED ORDER — TIOTROPIUM BROMIDE MONOHYDRATE 2.5 MCG/ACT IN AERS: 2.0000 | INHALATION_SPRAY | Freq: Every day | RESPIRATORY_TRACT | Status: DC

## 2014-11-06 MED ORDER — FLUTICASONE FUROATE-VILANTEROL 100-25 MCG/INH IN AEPB: 1.0000 | INHALATION_SPRAY | Freq: Every day | RESPIRATORY_TRACT | Status: DC

## 2014-11-06 NOTE — Telephone Encounter (Signed)
Spoke with pt- aware that samples are up from to be picked up.  Nothing further needed.

## 2014-11-12 DIAGNOSIS — G4700 Insomnia, unspecified: Secondary | ICD-10-CM | POA: Diagnosis not present

## 2014-11-12 DIAGNOSIS — Z6822 Body mass index (BMI) 22.0-22.9, adult: Secondary | ICD-10-CM | POA: Diagnosis not present

## 2014-11-12 DIAGNOSIS — R51 Headache: Secondary | ICD-10-CM | POA: Diagnosis not present

## 2014-11-19 ENCOUNTER — Telehealth: Payer: Self-pay | Admitting: Pulmonary Disease

## 2014-11-19 MED ORDER — FLUTICASONE FUROATE-VILANTEROL 100-25 MCG/INH IN AEPB: 1.0000 | INHALATION_SPRAY | Freq: Every day | RESPIRATORY_TRACT | Status: DC

## 2014-11-19 NOTE — Telephone Encounter (Signed)
Sample of Virgel BouquetBreo has been placed up front for pick up. Pt is aware.

## 2014-12-04 ENCOUNTER — Telehealth: Payer: Self-pay | Admitting: Pulmonary Disease

## 2014-12-04 NOTE — Telephone Encounter (Signed)
Spoke with pt, needs spiriva respimat and breo samples.  These have been placed up front.  Nothing further needed at this time.

## 2014-12-17 ENCOUNTER — Telehealth: Payer: Self-pay | Admitting: Pulmonary Disease

## 2014-12-17 MED ORDER — TIOTROPIUM BROMIDE MONOHYDRATE 2.5 MCG/ACT IN AERS: 2.0000 | INHALATION_SPRAY | Freq: Every day | RESPIRATORY_TRACT | Status: DC

## 2014-12-17 MED ORDER — FLUTICASONE FUROATE-VILANTEROL 100-25 MCG/INH IN AEPB: 1.0000 | INHALATION_SPRAY | Freq: Every day | RESPIRATORY_TRACT | Status: DC

## 2014-12-17 NOTE — Telephone Encounter (Signed)
Samples have been placed up front for pick up. Pt is aware. Nothing further was needed. 

## 2015-01-02 ENCOUNTER — Telehealth: Payer: Self-pay | Admitting: Pulmonary Disease

## 2015-01-02 NOTE — Telephone Encounter (Signed)
Pt informed that sample of Breo was left at front desk.  No samples of Spiriva available at this time.

## 2015-01-20 ENCOUNTER — Telehealth: Payer: Self-pay | Admitting: Pulmonary Disease

## 2015-01-20 NOTE — Telephone Encounter (Signed)
Called and spoke to pt. Pt requesting samples of Breo and Spiriva. Breo sample left up front, there are no Spiriva samples. Patient assistance forms for Spiriva and Breo left up front with sample. Pt verbalized understanding and denied any further questions or concerns at this time.

## 2015-01-20 NOTE — Telephone Encounter (Signed)
Pt cb about samples, please cb at previous number listed

## 2015-01-30 DIAGNOSIS — N182 Chronic kidney disease, stage 2 (mild): Secondary | ICD-10-CM | POA: Diagnosis not present

## 2015-01-31 ENCOUNTER — Telehealth: Payer: Self-pay | Admitting: Pulmonary Disease

## 2015-01-31 MED ORDER — FLUTICASONE FUROATE-VILANTEROL 100-25 MCG/INH IN AEPB: 1.0000 | INHALATION_SPRAY | Freq: Every day | RESPIRATORY_TRACT | Status: DC

## 2015-01-31 NOTE — Telephone Encounter (Signed)
Spoke with pt, requesting breo samples.  I advised these would be placed up front for pickup.  Nothing further needed.

## 2015-02-18 DIAGNOSIS — E871 Hypo-osmolality and hyponatremia: Secondary | ICD-10-CM | POA: Diagnosis not present

## 2015-02-18 DIAGNOSIS — I10 Essential (primary) hypertension: Secondary | ICD-10-CM | POA: Diagnosis not present

## 2015-02-18 DIAGNOSIS — N182 Chronic kidney disease, stage 2 (mild): Secondary | ICD-10-CM | POA: Diagnosis not present

## 2015-02-18 DIAGNOSIS — I429 Cardiomyopathy, unspecified: Secondary | ICD-10-CM | POA: Diagnosis not present

## 2015-02-27 ENCOUNTER — Telehealth: Payer: Self-pay | Admitting: Pulmonary Disease

## 2015-02-27 NOTE — Telephone Encounter (Signed)
Sample of Virgel BouquetBreo has been left at the front desk for pick up. Pt is aware. Nothing further was needed.

## 2015-03-11 ENCOUNTER — Telehealth: Payer: Self-pay | Admitting: Pulmonary Disease

## 2015-03-11 NOTE — Telephone Encounter (Signed)
Samples will be left at front desk for pick up. Attempted to call pt. Line was busy. Will try back later.

## 2015-03-12 NOTE — Telephone Encounter (Signed)
Spoke with pt granddaughter, aware that samples up front to be picked up. Nothing further needed.

## 2015-03-21 ENCOUNTER — Emergency Department (HOSPITAL_COMMUNITY): Payer: Medicare Other

## 2015-03-21 ENCOUNTER — Encounter (HOSPITAL_COMMUNITY): Payer: Self-pay | Admitting: Emergency Medicine

## 2015-03-21 ENCOUNTER — Emergency Department (HOSPITAL_COMMUNITY)
Admission: EM | Admit: 2015-03-21 | Discharge: 2015-03-21 | Disposition: A | Discharge status: Home / Self Care | Payer: Medicare Other | Attending: Emergency Medicine | Admitting: Emergency Medicine

## 2015-03-21 DIAGNOSIS — S0990XA Unspecified injury of head, initial encounter: Secondary | ICD-10-CM | POA: Insufficient documentation

## 2015-03-21 DIAGNOSIS — Y998 Other external cause status: Secondary | ICD-10-CM | POA: Diagnosis not present

## 2015-03-21 DIAGNOSIS — Z951 Presence of aortocoronary bypass graft: Secondary | ICD-10-CM | POA: Insufficient documentation

## 2015-03-21 DIAGNOSIS — Z7951 Long term (current) use of inhaled steroids: Secondary | ICD-10-CM | POA: Diagnosis not present

## 2015-03-21 DIAGNOSIS — Z8701 Personal history of pneumonia (recurrent): Secondary | ICD-10-CM | POA: Diagnosis not present

## 2015-03-21 DIAGNOSIS — I951 Orthostatic hypotension: Secondary | ICD-10-CM | POA: Insufficient documentation

## 2015-03-21 DIAGNOSIS — W01198A Fall on same level from slipping, tripping and stumbling with subsequent striking against other object, initial encounter: Secondary | ICD-10-CM | POA: Insufficient documentation

## 2015-03-21 DIAGNOSIS — I2510 Atherosclerotic heart disease of native coronary artery without angina pectoris: Secondary | ICD-10-CM | POA: Insufficient documentation

## 2015-03-21 DIAGNOSIS — K219 Gastro-esophageal reflux disease without esophagitis: Secondary | ICD-10-CM | POA: Diagnosis not present

## 2015-03-21 DIAGNOSIS — Z8711 Personal history of peptic ulcer disease: Secondary | ICD-10-CM | POA: Insufficient documentation

## 2015-03-21 DIAGNOSIS — Z7982 Long term (current) use of aspirin: Secondary | ICD-10-CM | POA: Diagnosis not present

## 2015-03-21 DIAGNOSIS — Z86010 Personal history of colonic polyps: Secondary | ICD-10-CM | POA: Insufficient documentation

## 2015-03-21 DIAGNOSIS — S51812A Laceration without foreign body of left forearm, initial encounter: Secondary | ICD-10-CM | POA: Diagnosis not present

## 2015-03-21 DIAGNOSIS — Z79899 Other long term (current) drug therapy: Secondary | ICD-10-CM | POA: Diagnosis not present

## 2015-03-21 DIAGNOSIS — S5012XA Contusion of left forearm, initial encounter: Secondary | ICD-10-CM | POA: Diagnosis not present

## 2015-03-21 DIAGNOSIS — M7989 Other specified soft tissue disorders: Secondary | ICD-10-CM | POA: Diagnosis not present

## 2015-03-21 DIAGNOSIS — R609 Edema, unspecified: Secondary | ICD-10-CM | POA: Insufficient documentation

## 2015-03-21 DIAGNOSIS — I129 Hypertensive chronic kidney disease with stage 1 through stage 4 chronic kidney disease, or unspecified chronic kidney disease: Secondary | ICD-10-CM | POA: Insufficient documentation

## 2015-03-21 DIAGNOSIS — S20211A Contusion of right front wall of thorax, initial encounter: Secondary | ICD-10-CM | POA: Diagnosis not present

## 2015-03-21 DIAGNOSIS — N183 Chronic kidney disease, stage 3 (moderate): Secondary | ICD-10-CM | POA: Diagnosis not present

## 2015-03-21 DIAGNOSIS — S5011XA Contusion of right forearm, initial encounter: Secondary | ICD-10-CM | POA: Insufficient documentation

## 2015-03-21 DIAGNOSIS — J449 Chronic obstructive pulmonary disease, unspecified: Secondary | ICD-10-CM | POA: Diagnosis not present

## 2015-03-21 DIAGNOSIS — W19XXXA Unspecified fall, initial encounter: Secondary | ICD-10-CM

## 2015-03-21 DIAGNOSIS — S51811A Laceration without foreign body of right forearm, initial encounter: Secondary | ICD-10-CM | POA: Insufficient documentation

## 2015-03-21 DIAGNOSIS — Y9289 Other specified places as the place of occurrence of the external cause: Secondary | ICD-10-CM | POA: Diagnosis not present

## 2015-03-21 DIAGNOSIS — S299XXA Unspecified injury of thorax, initial encounter: Secondary | ICD-10-CM | POA: Diagnosis not present

## 2015-03-21 DIAGNOSIS — Z87891 Personal history of nicotine dependence: Secondary | ICD-10-CM | POA: Diagnosis not present

## 2015-03-21 DIAGNOSIS — E785 Hyperlipidemia, unspecified: Secondary | ICD-10-CM | POA: Diagnosis not present

## 2015-03-21 DIAGNOSIS — S3992XA Unspecified injury of lower back, initial encounter: Secondary | ICD-10-CM | POA: Diagnosis not present

## 2015-03-21 DIAGNOSIS — Y9389 Activity, other specified: Secondary | ICD-10-CM | POA: Insufficient documentation

## 2015-03-21 DIAGNOSIS — F1010 Alcohol abuse, uncomplicated: Secondary | ICD-10-CM | POA: Diagnosis not present

## 2015-03-21 DIAGNOSIS — R0781 Pleurodynia: Secondary | ICD-10-CM | POA: Diagnosis not present

## 2015-03-21 LAB — CBC WITH DIFFERENTIAL/PLATELET
BASOS ABS: 0 10*3/uL (ref 0.0–0.1)
Basophils Relative: 0 % (ref 0–1)
Eosinophils Absolute: 0.1 10*3/uL (ref 0.0–0.7)
Eosinophils Relative: 1 % (ref 0–5)
HEMATOCRIT: 46.1 % (ref 39.0–52.0)
Hemoglobin: 15.4 g/dL (ref 13.0–17.0)
LYMPHS PCT: 12 % (ref 12–46)
Lymphs Abs: 1.3 10*3/uL (ref 0.7–4.0)
MCH: 32.4 pg (ref 26.0–34.0)
MCHC: 33.4 g/dL (ref 30.0–36.0)
MCV: 97.1 fL (ref 78.0–100.0)
MONOS PCT: 7 % (ref 3–12)
Monocytes Absolute: 0.8 10*3/uL (ref 0.1–1.0)
Neutro Abs: 8.8 10*3/uL — ABNORMAL HIGH (ref 1.7–7.7)
Neutrophils Relative %: 80 % — ABNORMAL HIGH (ref 43–77)
Platelets: 296 10*3/uL (ref 150–400)
RBC: 4.75 MIL/uL (ref 4.22–5.81)
RDW: 12.5 % (ref 11.5–15.5)
WBC: 11.1 10*3/uL — ABNORMAL HIGH (ref 4.0–10.5)

## 2015-03-21 LAB — ETHANOL: ALCOHOL ETHYL (B): 19 mg/dL — AB (ref <5)

## 2015-03-21 LAB — COMPREHENSIVE METABOLIC PANEL
ALK PHOS: 90 U/L (ref 38–126)
ALT: 15 U/L — ABNORMAL LOW (ref 17–63)
ANION GAP: 10 (ref 5–15)
AST: 21 U/L (ref 15–41)
Albumin: 3.8 g/dL (ref 3.5–5.0)
BUN: 10 mg/dL (ref 6–20)
CHLORIDE: 102 mmol/L (ref 101–111)
CO2: 28 mmol/L (ref 22–32)
Calcium: 8.4 mg/dL — ABNORMAL LOW (ref 8.9–10.3)
Creatinine, Ser: 0.63 mg/dL (ref 0.61–1.24)
GFR calc Af Amer: >60 mL/min (ref >60)
GFR calc non Af Amer: >60 mL/min (ref >60)
GLUCOSE: 98 mg/dL (ref 65–99)
POTASSIUM: 4.3 mmol/L (ref 3.5–5.1)
SODIUM: 140 mmol/L (ref 135–145)
Total Bilirubin: 0.7 mg/dL (ref 0.3–1.2)
Total Protein: 7.1 g/dL (ref 6.5–8.1)

## 2015-03-21 LAB — URINALYSIS, ROUTINE W REFLEX MICROSCOPIC
BILIRUBIN URINE: NEGATIVE
Glucose, UA: NEGATIVE mg/dL
Hgb urine dipstick: NEGATIVE
LEUKOCYTES UA: NEGATIVE
NITRITE: NEGATIVE
PROTEIN: NEGATIVE mg/dL
Specific Gravity, Urine: 1.015 (ref 1.005–1.030)
Urobilinogen, UA: 0.2 mg/dL (ref 0.0–1.0)
pH: 6.5 (ref 5.0–8.0)

## 2015-03-21 LAB — BRAIN NATRIURETIC PEPTIDE: B Natriuretic Peptide: 182 pg/mL — ABNORMAL HIGH (ref 0.0–100.0)

## 2015-03-21 MED ORDER — HYDROCODONE-ACETAMINOPHEN 5-325 MG PO TABS: 2.0000 | ORAL_TABLET | ORAL | Status: AC | PRN

## 2015-03-21 NOTE — Discharge Instructions (Signed)
Stop drinking. Do not use alcohol while taking Vicodin for pain. Hold a pillow against your sore ribs, and cough or take deep breaths 10 times. Do this 3 times every day.   Alcohol Use Disorder Alcohol use disorder is a mental disorder. It is not a one-time incident of heavy drinking. Alcohol use disorder is the excessive and uncontrollable use of alcohol over time that leads to problems with functioning in one or more areas of daily living. People with this disorder risk harming themselves and others when they drink to excess. Alcohol use disorder also can cause other mental disorders, such as mood and anxiety disorders, and serious physical problems. People with alcohol use disorder often misuse other drugs.  Alcohol use disorder is common and widespread. Some people with this disorder drink alcohol to cope with or escape from negative life events. Others drink to relieve chronic pain or symptoms of mental illness. People with a family history of alcohol use disorder are at higher risk of losing control and using alcohol to excess.  SYMPTOMS  Signs and symptoms of alcohol use disorder may include the following:   Consumption ofalcohol inlarger amounts or over a longer period of time than intended.  Multiple unsuccessful attempts to cutdown or control alcohol use.   A great deal of time spent obtaining alcohol, using alcohol, or recovering from the effects of alcohol (hangover).  A strong desire or urge to use alcohol (cravings).   Continued use of alcohol despite problems at work, school, or home because of alcohol use.   Continued use of alcohol despite problems in relationships because of alcohol use.  Continued use of alcohol in situations when it is physically hazardous, such as driving a car.  Continued use of alcohol despite awareness of a physical or psychological problem that is likely related to alcohol use. Physical problems related to alcohol use can involve the brain,  heart, liver, stomach, and intestines. Psychological problems related to alcohol use include intoxication, depression, anxiety, psychosis, delirium, and dementia.   The need for increased amounts of alcohol to achieve the same desired effect, or a decreased effect from the consumption of the same amount of alcohol (tolerance).  Withdrawal symptoms upon reducing or stopping alcohol use, or alcohol use to reduce or avoid withdrawal symptoms. Withdrawal symptoms include:  Racing heart.  Hand tremor.  Difficulty sleeping.  Nausea.  Vomiting.  Hallucinations.  Restlessness.  Seizures. DIAGNOSIS Alcohol use disorder is diagnosed through an assessment by your health care provider. Your health care provider may start by asking three or four questions to screen for excessive or problematic alcohol use. To confirm a diagnosis of alcohol use disorder, at least two symptoms must be present within a 5669-month period. The severity of alcohol use disorder depends on the number of symptoms:  Mild--two or three.  Moderate--four or five.  Severe--six or more. Your health care provider may perform a physical exam or use results from lab tests to see if you have physical problems resulting from alcohol use. Your health care provider may refer you to a mental health professional for evaluation. TREATMENT  Some people with alcohol use disorder are able to reduce their alcohol use to low-risk levels. Some people with alcohol use disorder need to quit drinking alcohol. When necessary, mental health professionals with specialized training in substance use treatment can help. Your health care provider can help you decide how severe your alcohol use disorder is and what type of treatment you need. The following forms of  treatment are available:   Detoxification. Detoxification involves the use of prescription medicines to prevent alcohol withdrawal symptoms in the first week after quitting. This is important  for people with a history of symptoms of withdrawal and for heavy drinkers who are likely to have withdrawal symptoms. Alcohol withdrawal can be dangerous and, in severe cases, cause death. Detoxification is usually provided in a hospital or in-patient substance use treatment facility.  Counseling or talk therapy. Talk therapy is provided by substance use treatment counselors. It addresses the reasons people use alcohol and ways to keep them from drinking again. The goals of talk therapy are to help people with alcohol use disorder find healthy activities and ways to cope with life stress, to identify and avoid triggers for alcohol use, and to handle cravings, which can cause relapse.  Medicines.Different medicines can help treat alcohol use disorder through the following actions:  Decrease alcohol cravings.  Decrease the positive reward response felt from alcohol use.  Produce an uncomfortable physical reaction when alcohol is used (aversion therapy).  Support groups. Support groups are run by people who have quit drinking. They provide emotional support, advice, and guidance. These forms of treatment are often combined. Some people with alcohol use disorder benefit from intensive combination treatment provided by specialized substance use treatment centers. Both inpatient and outpatient treatment programs are available. Document Released: 11/11/2004 Document Revised: 02/18/2014 Document Reviewed: 01/11/2013 [facility] Patient Information 2015 [place], [place]. This information is not intended to replace advice given to you by your health care provider. Make sure you discuss any questions you have with your health care provider.  Chest Contusion A chest contusion is a deep bruise on your chest area. Contusions are the result of an injury that caused bleeding under the skin. A chest contusion may involve bruising of the skin, muscles, or ribs. The contusion may turn blue, purple, or yellow. Minor  injuries will give you a painless contusion, but more severe contusions may stay painful and swollen for a few weeks. CAUSES  A contusion is usually caused by a blow, trauma, or direct force to an area of the body. SYMPTOMS   Swelling and redness of the injured area.  Discoloration of the injured area.  Tenderness and soreness of the injured area.  Pain. DIAGNOSIS  The diagnosis can be made by taking a history and performing a physical exam. An X-ray, CT scan, or MRI may be needed to determine if there were any associated injuries, such as broken bones (fractures) or internal injuries. TREATMENT  Often, the best treatment for a chest contusion is resting, icing, and applying cold compresses to the injured area. Deep breathing exercises may be recommended to reduce the risk of pneumonia. Over-the-counter medicines may also be recommended for pain control. HOME CARE INSTRUCTIONS   Put ice on the injured area.  Put ice in a plastic bag.  Place a towel between your skin and the bag.  Leave the ice on for 15-20 minutes, 03-04 times a day.  Only take over-the-counter or prescription medicines as directed by your caregiver. Your caregiver may recommend avoiding anti-inflammatory medicines (aspirin, ibuprofen, and naproxen) for 48 hours because these medicines may increase bruising.  Rest the injured area.  Perform deep-breathing exercises as directed by your caregiver.  Stop smoking if you smoke.  Do not lift objects over 5 pounds (2.3 kg) for 3 days or longer if recommended by your caregiver. SEEK IMMEDIATE MEDICAL CARE IF:   You have increased bruising or swelling.  You have pain that is getting worse.  You have difficulty breathing.  You have dizziness, weakness, or fainting.  You have blood in your urine or stool.  You cough up or vomit blood.  Your swelling or pain is not relieved with medicines. MAKE SURE YOU:   Understand these instructions.  Will watch your  condition.  Will get help right away if you are not doing well or get worse. Document Released: 06/29/2001 Document Revised: 06/28/2012 Document Reviewed: 03/27/2012 [facility] Patient Information 2015 [place], [place]. This information is not intended to replace advice given to you by your health care provider. Make sure you discuss any questions you have with your health care provider.

## 2015-03-21 NOTE — ED Notes (Addendum)
Per pt and pt family. Pt has tripped and fallen x2 this am.pt reports hitting head but denies LOC. Pt reports is currently on a blood thinner. Pt reports right rib pain and lower back pain. Pt family also reports "i am concerned he is retaining fluid." moderate dyspnea and pursed lip breathing noted with speech.

## 2015-03-21 NOTE — ED Provider Notes (Signed)
CSN: [id number]     Arrival date & time 03/21/15  1148 History  This chart was scribed for Rolland Porter, MD by Elveria Rising, ED scribe.  This patient was seen in room APA05/APA05 and the patient's care was started at 12:15 PM.   Chief Complaint  Patient presents with  . Fall   The history is provided by the patient. No language interpreter was used.   HPI Comments: [name] is a 79 y.o. male with extensive PMHx who presents to the Emergency Department after a fall early this morning. Patient reports slipping on loose rug and falling onto shelf injuring his right lower ribs and frontal head. Patient attributes falling to simply losing his balance. Patient presents with additional injuries to his right wrist from a fall last night. Patient reports history of heart bypass; on anticoagulant. Patient is not a smoker, but admits to drinking 1-2 glasses of red wine per week. States he drinks in the evening to relax himself before bed. Patient on 3.5L of O2 at home. Patient denies increase in difficulty breathing, but he reports productive cough with clear sputum. Patient denies history of CHF.   Past Medical History  Diagnosis Date  . Hypertension   . Hyperlipidemia     excellent control  . Chronic kidney disease, stage III (moderate)   . Barrett's esophagus     frequent surveillance EGDs by Dr. Jena Gauss  . Chronic obstructive pulmonary disease     severe emphysema  . Cough, persistent   . Hyperkalemia     peak of 6.2 in 2009  . Colonic polyp     resected 2007 via colonoscope  . Diverticulosis   . Pneumonia     10/2010  . Coronary artery disease     CABG in 03/2008 for severe left main disease with critical RCA stenosis; EF of 45-50% with inferior wall hypokinesis; echocardiogram in 10/2008-no change in LV function; stress nuclear-conflicting information in report indicating inferior reversibility but inferior scar  . Gastroesophageal reflux disease   . Peptic ulcer disease      perforation and surgical repair at [facility] in 1999; treated for positive Helicobacter pylori  . History of orthostatic hypotension     noted following CABG surgery  . COPD (chronic obstructive pulmonary disease)    Past Surgical History  Procedure Laterality Date  . Laparoscopic gastrotomy w/ repair of ulcer      1999  . Coronary artery bypass graft  03/2008  . Colon surgery    . Cardiac surgery     Family History  Problem Relation Age of Onset  . Heart disease Mother   . Heart disease Father    History  Substance Use Topics  . Smoking status: Former Smoker -- 1.00 packs/day for 20 years    Types: Cigarettes, Pipe, Cigars    Quit date: 01/12/1998  . Smokeless tobacco: Never Used  . Alcohol Use: Yes     Comment: rare wine    Review of Systems  Constitutional: Negative for fever, chills, diaphoresis, appetite change and fatigue.  HENT: Negative for mouth sores, sore throat and trouble swallowing.   Eyes: Negative for visual disturbance.  Respiratory: Positive for cough. Negative for chest tightness, shortness of breath and wheezing.        Rib pain  Cardiovascular: Positive for leg swelling. Negative for chest pain.  Gastrointestinal: Negative for nausea, vomiting, abdominal pain, diarrhea and abdominal distention.  Endocrine: Negative for polydipsia, polyphagia and polyuria.  Genitourinary: Negative for dysuria,  frequency and hematuria.  Musculoskeletal: Positive for back pain and arthralgias. Negative for gait problem.  Skin: Negative for color change, pallor and rash.  Neurological: Positive for headaches. Negative for dizziness, syncope and light-headedness.  Hematological: Does not bruise/bleed easily.  Psychiatric/Behavioral: Negative for behavioral problems and confusion.      Allergies  Review of patient's allergies indicates no known allergies.  Home Medications   Prior to Admission medications   Medication Sig Start Date End Date Taking? Authorizing  Provider  acetaminophen (TYLENOL) 500 MG tablet Take 500 mg by mouth every 6 (six) hours as needed for pain.   Yes Historical Provider, MD  albuterol (PROVENTIL HFA;VENTOLIN HFA) 108 (90 BASE) MCG/ACT inhaler Inhale 2 puffs into the lungs every 6 (six) hours as needed for wheezing or shortness of breath.   Yes Historical Provider, MD  albuterol (PROVENTIL) (2.5 MG/3ML) 0.083% nebulizer solution Take 3 mLs (2.5 mg total) by nebulization every 6 (six) hours. 02/06/14  Yes Erick Blinks, MD  ALPRAZolam Prudy Feeler) 0.25 MG tablet Take 1 tablet (0.25 mg total) by mouth at bedtime as needed for sleep. 12/04/12  Yes Simonne Martinet, NP  amLODipine (NORVASC) 10 MG tablet Take 5 mg by mouth daily.  12/15/10  Yes Historical Provider, MD  aspirin 81 MG tablet Take 81 mg by mouth daily.   Yes Historical Provider, MD  docusate sodium (COLACE) 100 MG capsule Take 200 mg by mouth at bedtime as needed for constipation.    Yes Historical Provider, MD  Fluticasone Furoate-Vilanterol (BREO ELLIPTA) 100-25 MCG/INH AEPB Inhale 1 puff into the lungs daily. 01/31/15  Yes Oretha Milch, MD  furosemide (LASIX) 20 MG tablet Take 1 tablet by mouth daily.  02/01/11  Yes Historical Provider, MD  guaiFENesin (MUCINEX) 600 MG 12 hr tablet Take 2 tablets (1,200 mg total) by mouth 2 (two) times daily. 02/06/14  Yes Erick Blinks, MD  KLOR-CON M10 10 MEQ tablet Take 1 tablet by mouth daily. 02/26/14  Yes Historical Provider, MD  Omega-3 Fatty Acids (EQL FISH OIL) 1000 MG CAPS Take 2 capsules (2,000 mg total) by mouth daily. 01/13/11  Yes Kathlen Brunswick, MD  omeprazole (PRILOSEC) 20 MG capsule Take 20 mg by mouth daily. 30 minutes before breakfast   Yes Historical Provider, MD  polyethylene glycol (MIRALAX / GLYCOLAX) packet Take 17 g by mouth daily. 02/06/14  Yes Erick Blinks, MD  pravastatin (PRAVACHOL) 80 MG tablet Take 80 mg by mouth at bedtime.   Yes Historical Provider, MD  Tiotropium Bromide Monohydrate (SPIRIVA RESPIMAT) 2.5 MCG/ACT  AERS Inhale 2 puffs into the lungs daily. 12/17/14  Yes Oretha Milch, MD  HYDROcodone-acetaminophen (NORCO/VICODIN) 5-325 MG per tablet Take 2 tablets by mouth every 4 (four) hours as needed. 03/21/15   Rolland Porter, MD   Triage Vitals: BP 141/73 mmHg  Pulse 81  Temp(Src) 97.3 F (36.3 C) (Oral)  Resp 19  Ht  (1.727 m)  Wt 155 lb (70.308 kg)  BMI 23.57 kg/m2  SpO2 94% Physical Exam  Constitutional: He is oriented to person, place, and time. He appears well-developed and well-nourished. No distress.  HENT:  Head: Normocephalic.  Eyes: Conjunctivae are normal. Pupils are equal, round, and reactive to light. No scleral icterus.  Neck: Normal range of motion. Neck supple. No thyromegaly present.  Cardiovascular: Normal rate and regular rhythm.  Exam reveals no gallop and no friction rub.   No murmur heard. Pulmonary/Chest: Effort normal and breath sounds normal. No respiratory distress. He has  no wheezes. He has no rales.  Tender over right lateral inferior ribs. No crepitus or ecchymosis.   Abdominal: Soft. Bowel sounds are normal. He exhibits no distension. There is no tenderness. There is no rebound.  Musculoskeletal: Normal range of motion. He exhibits edema.  1+ edema in lower extremities.   Neurological: He is alert and oriented to person, place, and time.  Skin: Skin is warm and dry. No rash noted.  Several areas of skin tears and ecchymosis of bilateral forearms. Full ROM.  Psychiatric: He has a normal mood and affect. His behavior is normal.    ED Course  Procedures (including critical care time)  COORDINATION OF CARE: 12:25 PM- Will order CT of head and imaging of chest/ribs. Discussed treatment plan with patient at bedside and patient agreed to plan.   Labs Review Labs Reviewed  CBC WITH DIFFERENTIAL/PLATELET - Abnormal; Notable for the following:    WBC 11.1 (*)    Neutrophils Relative % 80 (*)    Neutro Abs 8.8 (*)    All other components within normal limits   COMPREHENSIVE METABOLIC PANEL - Abnormal; Notable for the following:    Calcium 8.4 (*)    ALT 15 (*)    All other components within normal limits  ETHANOL - Abnormal; Notable for the following:    Alcohol, Ethyl (B) 19 (*)    All other components within normal limits  BRAIN NATRIURETIC PEPTIDE - Abnormal; Notable for the following:    B Natriuretic Peptide 182.0 (*)    All other components within normal limits  URINALYSIS, ROUTINE W REFLEX MICROSCOPIC (NOT [facility]) - Abnormal; Notable for the following:    Ketones, ur TRACE (*)    All other components within normal limits  URINE MICROSCOPIC-ADD ON    Imaging Review No results found.   EKG Interpretation   Date/Time:  Friday March 21 2015 12:08:12 EDT Ventricular Rate:  80 PR Interval:  197 QRS Duration: 107 QT Interval:  429 QTC Calculation: 495 R Axis:   -46 Text Interpretation:  Sinus rhythm Ventricular bigeminy Anterior infarct,  old Baseline wander in lead(s) V6 Confirmed by Fayrene FearingJAMES  MD, Feather Berrie ([id number]) on  03/21/2015 1:16:47 PM      MDM   Final diagnoses:  Fall  Chest wall contusion, right, initial encounter  Alcohol abuse, daily use    Patient on his 3.5 L and saturating 95%. X-ray showed no obvious fracture pneumothorax hemothorax or effusion. No obvious skull fracture, or extraction of fluid or blood collections. Awaiting formal read. Reexamine. I discussed with him his alcohol level. He has not drank since last night, thus I think is likely drinking heavier than he admits. I discussed this with him openly in front of his family. He states he has no problem stopping drinking. He lives with his daughter. He promises me that he will not give his daughter hard time if she attempts to curtail his drinking especially well taking pain medications. He is appropriate for outpatient treatment.   I personally performed the services described in this documentation, which was scribed in my presence. The recorded information has  been reviewed and is accurate.    Rolland PorterMark Tanner Yeley, MD 03/21/15 [phone]

## 2015-03-21 NOTE — ED Notes (Signed)
MD at bedside. 

## 2015-03-21 NOTE — ED Notes (Addendum)
Pt O2 saturation on 4liters 80% in triage. Pt titrated up to 5 liters, no change in o2 saturation. O2 saturation increased to 6liters. Pt 02 saturation 94%. While EKG was being obtained pursed lip breath continues. Pt maintaining o2 saturation at 96%. Pt oxygen titrated down to 5 liters and pt maintaining 94% at this time. Primary RN aware.Family at bedside.

## 2015-04-10 ENCOUNTER — Telehealth: Payer: Self-pay | Admitting: Pulmonary Disease

## 2015-04-10 NOTE — Telephone Encounter (Signed)
Called and spoke to pt's son. Pt requesting breo and spiriva respimat. Both samples left up front for pick up. Pt's son verbalized understanding and denied any further questions or concerns at this time.

## 2015-05-13 ENCOUNTER — Telehealth: Payer: Self-pay | Admitting: Pulmonary Disease

## 2015-05-13 NOTE — Telephone Encounter (Signed)
Sample of Breo 100 placed up front. Patient aware. Nothing further needed.

## 2015-05-26 ENCOUNTER — Telehealth: Payer: Self-pay | Admitting: Pulmonary Disease

## 2015-05-26 NOTE — Telephone Encounter (Signed)
Samples of Breo have been left at the front desk for pick up. Pt is aware. Nothing further was needed. 

## 2015-05-27 ENCOUNTER — Other Ambulatory Visit: Payer: Self-pay | Admitting: Emergency Medicine

## 2015-05-27 DIAGNOSIS — I10 Essential (primary) hypertension: Secondary | ICD-10-CM | POA: Diagnosis not present

## 2015-05-27 DIAGNOSIS — J449 Chronic obstructive pulmonary disease, unspecified: Secondary | ICD-10-CM | POA: Diagnosis not present

## 2015-05-27 DIAGNOSIS — Z1389 Encounter for screening for other disorder: Secondary | ICD-10-CM | POA: Diagnosis not present

## 2015-05-27 DIAGNOSIS — F329 Major depressive disorder, single episode, unspecified: Secondary | ICD-10-CM | POA: Diagnosis not present

## 2015-05-27 DIAGNOSIS — G4701 Insomnia due to medical condition: Secondary | ICD-10-CM | POA: Diagnosis not present

## 2015-05-27 DIAGNOSIS — Z6821 Body mass index (BMI) 21.0-21.9, adult: Secondary | ICD-10-CM | POA: Diagnosis not present

## 2015-05-27 MED ORDER — TIOTROPIUM BROMIDE MONOHYDRATE 2.5 MCG/ACT IN AERS: 2.0000 | INHALATION_SPRAY | Freq: Every day | RESPIRATORY_TRACT | Status: DC

## 2015-06-17 ENCOUNTER — Ambulatory Visit (INDEPENDENT_AMBULATORY_CARE_PROVIDER_SITE_OTHER): Payer: Medicare Other | Admitting: Pulmonary Disease

## 2015-06-17 ENCOUNTER — Encounter: Payer: Self-pay | Admitting: Pulmonary Disease

## 2015-06-17 VITALS — BP 128/78 | HR 83 | Ht 69.0 in | Wt 151.2 lb

## 2015-06-17 DIAGNOSIS — J9611 Chronic respiratory failure with hypoxia: Secondary | ICD-10-CM

## 2015-06-17 DIAGNOSIS — J449 Chronic obstructive pulmonary disease, unspecified: Secondary | ICD-10-CM

## 2015-06-17 NOTE — Progress Notes (Signed)
   Subjective:    Patient ID: [name], male    DOB: Sep 30, 1936, 79 y.o.   MRN: [id number]  HPI PMD - [name]   78/M, ex- smoker- severe COPD on nocturnal O2 since 02/2010 & cough.   He developed a chronic cough.since his heart surgery by [name] in 6/09  Barrett's esophagus 3 cm noted to be stable by [name].  H/o sinus surgery, tonsillectomy   06/17/2015  Chief Complaint  Patient presents with  . Follow-up    Needs refills on Spiriva and Breo.  walking a little more, but heat causes some SOB.  Breathing doing a little better.     40m FU Accompanied by son   Spouse passed away 02/07/2014 of rectal cancer Remains on BREO and Spiriva.  No ER or hospital follow up  Compliant with O2 Fell down under influence of ETOH  - daughter lives with him now - denies excess ETOH intake No fever, chest pain, orthopnea, edema or n/v/d  No hemoptysis.   Significant tests/ events  02/2009 PFTs >> severe obstruction, FEV1 39%, no BD response  CT chest 08-Feb-2008 severe emphysema, scattered sub-cm nodules stable since 2005  CT chest 10/2010 neg PE, stable nodules, small RLL infx Rxed for HCAP with cipro & zosyn  Modified Ba swallow nml  Ct angio 2/12 did not show any suspicious nodules  11/04/2011 Started on Daliresp by Primary MD - stopped 4/13, makes him 'mean' , lost wt ,  4/13 ono on RA showed only 2 min desatn < 88% - off O2  06/2013 - Uses O2 at night, does not need much in daytime    Hosp adm in 11/2010 >> for cough syncope - admitted to tele but transferred to 2900 for another syncopal event  11/27/12 [facility]/CAP admission 07/05/13  - graduated from pulm rehab   Review of Systems neg for any significant sore throat, dysphagia, itching, sneezing, nasal congestion or excess/ purulent secretions, fever, chills, sweats, unintended wt loss, pleuritic or exertional cp, hempoptysis, orthopnea pnd or change in chronic leg swelling. Also denies presyncope, palpitations, heartburn, abdominal pain,  nausea, vomiting, diarrhea or change in bowel or urinary habits, dysuria,hematuria, rash, arthralgias, visual complaints, headache, numbness weakness or ataxia.     Objective:   Physical Exam  Gen. Pleasant, well-nourished, in no distress ENT - no lesions, no post nasal drip Neck: No JVD, no thyromegaly, no carotid bruits Lungs: no use of accessory muscles, no dullness to percussion, clear without rales or rhonchi  Cardiovascular: Rhythm regular, heart sounds  normal, no murmurs or gallops, no peripheral edema Musculoskeletal: No deformities, no cyanosis or clubbing        Assessment & Plan:

## 2015-06-17 NOTE — Assessment & Plan Note (Signed)
Stay on breo & spiriva Flu shot next month Call us as needed

## 2015-06-17 NOTE — Patient Instructions (Signed)
Stay on breo & spiriva Use Oxygen while walking & during sleep Flu shot next month Call us as needed

## 2015-06-17 NOTE — Assessment & Plan Note (Signed)
Use Oxygen while walking & during sleep

## 2015-06-28 DIAGNOSIS — Z23 Encounter for immunization: Secondary | ICD-10-CM | POA: Diagnosis not present

## 2015-07-07 ENCOUNTER — Telehealth: Payer: Self-pay | Admitting: Pulmonary Disease

## 2015-07-07 NOTE — Telephone Encounter (Signed)
Spoke with pt and informed that samples of Breo were left at front desk for pick up.

## 2015-08-07 ENCOUNTER — Telehealth: Payer: Self-pay | Admitting: Pulmonary Disease

## 2015-08-07 NOTE — Telephone Encounter (Signed)
Called and spoke to pt. Pt is requesting a Breo sample. One sample of Breo has been left up front for pick up. Pt verbalized understanding and denied any further questions or concerns at this time.

## 2015-08-25 ENCOUNTER — Telehealth: Payer: Self-pay | Admitting: Pulmonary Disease

## 2015-08-25 NOTE — Telephone Encounter (Signed)
Spoke with pt. Aware 2 samples left for pick up. Nothing further needed

## 2015-09-08 ENCOUNTER — Telehealth: Payer: Self-pay | Admitting: Pulmonary Disease

## 2015-09-08 NOTE — Telephone Encounter (Signed)
Spoke with pt. 2 samples left for pick up. Nothing further needed

## 2015-09-16 ENCOUNTER — Telehealth: Payer: Self-pay | Admitting: Pulmonary Disease

## 2015-09-16 NOTE — Telephone Encounter (Signed)
Called spoke with pt. Aware 2 samples of breo left for pick up. Nothing further needed

## 2015-10-07 ENCOUNTER — Encounter: Payer: Self-pay | Admitting: Internal Medicine

## 2015-10-09 ENCOUNTER — Telehealth: Payer: Self-pay | Admitting: Pulmonary Disease

## 2015-10-09 MED ORDER — FLUTICASONE FUROATE-VILANTEROL 100-25 MCG/INH IN AEPB: 1.0000 | INHALATION_SPRAY | Freq: Every day | RESPIRATORY_TRACT | Status: DC

## 2015-10-09 NOTE — Telephone Encounter (Signed)
Patient requesting samples of Breo. Patient advised that samples of Breo are at front for him to pick up. Nothing further needed.

## 2015-11-12 ENCOUNTER — Telehealth: Payer: Self-pay | Admitting: Adult Health

## 2015-11-12 MED ORDER — FLUTICASONE FUROATE-VILANTEROL 100-25 MCG/INH IN AEPB: 1.0000 | INHALATION_SPRAY | Freq: Every day | RESPIRATORY_TRACT | Status: DC

## 2015-11-12 NOTE — Telephone Encounter (Signed)
lmtcb X1 for pt.  No samples of spiriva at this time. breo samples left up front for pt.

## 2015-11-12 NOTE — Telephone Encounter (Signed)
Pt aware.

## 2015-11-24 ENCOUNTER — Telehealth: Payer: Self-pay | Admitting: Pulmonary Disease

## 2015-11-24 NOTE — Telephone Encounter (Signed)
Samples have been left up front for pick up. Pt is aware. Nothing further was needed. 

## 2015-12-09 ENCOUNTER — Telehealth: Payer: Self-pay | Admitting: Pulmonary Disease

## 2015-12-09 NOTE — Telephone Encounter (Signed)
ATC received busy signal. WCB 

## 2015-12-10 NOTE — Telephone Encounter (Signed)
called spoke with pt. Aware samples left for pick up. Nothing further needed

## 2015-12-10 NOTE — Telephone Encounter (Signed)
Patient Returned call [phone]

## 2015-12-15 ENCOUNTER — Encounter: Payer: Self-pay | Admitting: Adult Health

## 2015-12-15 ENCOUNTER — Ambulatory Visit (INDEPENDENT_AMBULATORY_CARE_PROVIDER_SITE_OTHER): Payer: Medicare Other | Admitting: Adult Health

## 2015-12-15 VITALS — BP 134/62 | HR 55 | Temp 97.7°F | Ht 68.0 in | Wt 149.0 lb

## 2015-12-15 DIAGNOSIS — J9611 Chronic respiratory failure with hypoxia: Secondary | ICD-10-CM

## 2015-12-15 DIAGNOSIS — J449 Chronic obstructive pulmonary disease, unspecified: Secondary | ICD-10-CM

## 2015-12-15 MED ORDER — TIOTROPIUM BROMIDE MONOHYDRATE 2.5 MCG/ACT IN AERS: 2.0000 | INHALATION_SPRAY | Freq: Every day | RESPIRATORY_TRACT | Status: DC

## 2015-12-15 NOTE — Assessment & Plan Note (Signed)
Continue on Oxygen 3l/m .  Followup with Dr. Vassie Loll in 6  months and as needed. Please contact office for sooner follow up if symptoms do not improve or worsen or seek emergency care

## 2015-12-15 NOTE — Patient Instructions (Signed)
Continue on Breo 1 puff daily , brush rinse and gargle after use.  Continue on Spiriva daily, brush rinse and gargle after use. Continue on Oxygen 3l/m .  Followup with Dr. Vassie Loll in 6  months and as needed. Please contact office for sooner follow up if symptoms do not improve or worsen or seek emergency care

## 2015-12-15 NOTE — Addendum Note (Signed)
Addended by: Karalee Height on: 12/15/2015 03:08 PM   Modules accepted: Orders

## 2015-12-15 NOTE — Progress Notes (Signed)
Subjective:    Patient ID: [name], male    DOB: [birth year], 80 y.o.   MRN: [id number]  HPI  PMD - [name]   79/M, ex- smoker- severe COPD on nocturnal O2 since 02/2010 & cough.   He developed a chronic cough.since his heart surgery by [name] in 6/09  Barrett's esophagus 3 cm noted to be stable by [name].  H/o sinus surgery, tonsillectomy   12/15/2015  Chief Complaint  Patient presents with  . Follow-up    RA pt here for a 6 month follow up. Pt states his breathing is unchanged since last ov and has no new complaints at this time.    Pt returns for a 6 month follow up  Remains on BREO and Spiriva .  Flu, pneumovax and Prevnar utd.  Remains on Oxygen 3l/m .  CXR 03/2015 with chronic changes.  Wt is stable, appetite is good.  Says overall breathing is at baseline with no flare of cough or wheezing .  He denies chest pain, orthopnea, edema or fever.    Significant tests/ events  02/2009 PFTs >> severe obstruction, FEV1 39%, no BD response  CT chest 01/2008 severe emphysema, scattered sub-cm nodules stable since 2005  CT chest 10/2010 neg PE, stable nodules, small RLL infx Rxed for HCAP with cipro & zosyn  Modified Ba swallow nml  Ct angio 2/12 did not show any suspicious nodules  11/04/2011 Started on Daliresp by Primary MD - stopped 4/13, makes him 'mean' , lost wt ,  4/13 ono on RA showed only 2 min desatn < 88% - off O2  06/2013 - Uses O2 at night, does not need much in daytime  07/05/13  - graduated from pulm rehab   Past Medical History  Diagnosis Date  . Hypertension   . Hyperlipidemia     excellent control  . Chronic kidney disease, stage III (moderate)   . Barrett's esophagus     frequent surveillance EGDs by Dr. Jena Gauss  . Chronic obstructive pulmonary disease (HCC)     severe emphysema  . Cough, persistent   . Hyperkalemia     peak of 6.2 in 2009  . Colonic polyp     resected 2007 via colonoscope  . Diverticulosis   . Pneumonia     10/2010  .  Coronary artery disease     CABG in 03/2008 for severe left main disease with critical RCA stenosis; EF of 45-50% with inferior wall hypokinesis; echocardiogram in 10/2008-no change in LV function; stress nuclear-conflicting information in report indicating inferior reversibility but inferior scar  . Gastroesophageal reflux disease   . Peptic ulcer disease     perforation and surgical repair at [facility] in 1999; treated for positive Helicobacter pylori  . History of orthostatic hypotension     noted following CABG surgery  . COPD (chronic obstructive pulmonary disease) (HCC)    Current Outpatient Prescriptions on File Prior to Visit  Medication Sig Dispense Refill  . acetaminophen (TYLENOL) 500 MG tablet Take 500 mg by mouth every 6 (six) hours as needed for pain.    Marland Kitchen albuterol (PROVENTIL HFA;VENTOLIN HFA) 108 (90 BASE) MCG/ACT inhaler Inhale 2 puffs into the lungs every 6 (six) hours as needed for wheezing or shortness of breath.    Marland Kitchen albuterol (PROVENTIL) (2.5 MG/3ML) 0.083% nebulizer solution Take 3 mLs (2.5 mg total) by nebulization every 6 (six) hours. 75 mL 12  . ALPRAZolam (XANAX) 0.25 MG tablet Take 1 tablet (0.25 mg total)  by mouth at bedtime as needed for sleep. 30 tablet 0  . amLODipine (NORVASC) 10 MG tablet Take 5 mg by mouth daily.     Marland Kitchen aspirin 81 MG tablet Take 81 mg by mouth daily.    Marland Kitchen docusate sodium (COLACE) 100 MG capsule Take 200 mg by mouth at bedtime as needed for constipation.     . furosemide (LASIX) 20 MG tablet Take 1 tablet by mouth daily.     Marland Kitchen guaiFENesin (MUCINEX) 600 MG 12 hr tablet Take 2 tablets (1,200 mg total) by mouth 2 (two) times daily. 14 tablet 0  . HYDROcodone-acetaminophen (NORCO/VICODIN) 5-325 MG per tablet Take 2 tablets by mouth every 4 (four) hours as needed. 10 tablet 0  . KLOR-CON M10 10 MEQ tablet Take 1 tablet by mouth daily.    . Omega-3 Fatty Acids (EQL FISH OIL) 1000 MG CAPS Take 2 capsules (2,000 mg total) by mouth daily. 60 capsule 3  .  omeprazole (PRILOSEC) 20 MG capsule Take 20 mg by mouth daily. 30 minutes before breakfast    . polyethylene glycol (MIRALAX / GLYCOLAX) packet Take 17 g by mouth daily. 14 each 0  . pravastatin (PRAVACHOL) 80 MG tablet Take 80 mg by mouth at bedtime.    . Tiotropium Bromide Monohydrate (SPIRIVA RESPIMAT) 2.5 MCG/ACT AERS Inhale 2 puffs into the lungs daily. 1 Inhaler 2  . Fluticasone Furoate-Vilanterol (BREO ELLIPTA) 100-25 MCG/INH AEPB Inhale 1 puff into the lungs daily. 2 each 0  . zolpidem (AMBIEN) 5 MG tablet Take 5 mg by mouth at bedtime as needed. Reported on 12/15/2015  2   No current facility-administered medications on file prior to visit.      Review of Systems  neg for any significant sore throat, dysphagia, itching, sneezing, nasal congestion or excess/ purulent secretions, fever, chills, sweats, unintended wt loss, pleuritic or exertional cp, hempoptysis, orthopnea pnd or change in chronic leg swelling. Also denies presyncope, palpitations, heartburn, abdominal pain, nausea, vomiting, diarrhea or change in bowel or urinary habits, dysuria,hematuria, rash, arthralgias, visual complaints, headache, numbness weakness or ataxia.     Objective:   Physical Exam  Filed Vitals:   12/15/15 1417  BP: 134/62  Pulse: 55  Temp: 97.7 F (36.5 C)  TempSrc: Oral  Height:  (1.727 m)  Weight: 149 lb (67.586 kg)  SpO2: 93%    Gen. Pleasant, elderly  in no distress ENT - no lesions, no post nasal drip Neck: No JVD, no thyromegaly, no carotid bruits Lungs: no use of accessory muscles, no dullness to percussion, decreased BS in bases  Cardiovascular: Rhythm regular, heart sounds  normal, no murmurs or gallops, no peripheral edema Musculoskeletal: No deformities, no cyanosis or clubbing        Assessment & Plan:

## 2015-12-15 NOTE — Assessment & Plan Note (Signed)
Compensated on present regimen   Plan  Continue on Breo 1 puff daily , brush rinse and gargle after use.  Continue on Spiriva daily, brush rinse and gargle after use. Continue on Oxygen 3l/m .  Followup with Dr. Vassie Loll in 6  months and as needed. Please contact office for sooner follow up if symptoms do not improve or worsen or seek emergency care

## 2015-12-17 NOTE — Progress Notes (Signed)
Reviewed & agree with plan  

## 2015-12-31 DIAGNOSIS — J439 Emphysema, unspecified: Secondary | ICD-10-CM | POA: Diagnosis not present

## 2015-12-31 DIAGNOSIS — E875 Hyperkalemia: Secondary | ICD-10-CM | POA: Diagnosis not present

## 2015-12-31 DIAGNOSIS — I10 Essential (primary) hypertension: Secondary | ICD-10-CM | POA: Diagnosis not present

## 2015-12-31 DIAGNOSIS — I429 Cardiomyopathy, unspecified: Secondary | ICD-10-CM | POA: Diagnosis not present

## 2015-12-31 DIAGNOSIS — E871 Hypo-osmolality and hyponatremia: Secondary | ICD-10-CM | POA: Diagnosis not present

## 2015-12-31 DIAGNOSIS — N182 Chronic kidney disease, stage 2 (mild): Secondary | ICD-10-CM | POA: Diagnosis not present

## 2016-02-02 ENCOUNTER — Telehealth: Payer: Self-pay | Admitting: Pulmonary Disease

## 2016-02-02 NOTE — Telephone Encounter (Signed)
Pt calling back again about  [name]

## 2016-02-02 NOTE — Telephone Encounter (Signed)
Spoke with pt and confirmed his request for Breo 100 and Spiriva Respimat samples. Pt advised that we do have them and I will leave one of each at front desk. Nothing further needed.

## 2016-02-05 DIAGNOSIS — Z0000 Encounter for general adult medical examination without abnormal findings: Secondary | ICD-10-CM | POA: Diagnosis not present

## 2016-02-05 DIAGNOSIS — Z1389 Encounter for screening for other disorder: Secondary | ICD-10-CM | POA: Diagnosis not present

## 2016-02-05 DIAGNOSIS — Z6821 Body mass index (BMI) 21.0-21.9, adult: Secondary | ICD-10-CM | POA: Diagnosis not present

## 2016-02-17 ENCOUNTER — Telehealth: Payer: Self-pay | Admitting: Pulmonary Disease

## 2016-02-17 MED ORDER — FLUTICASONE FUROATE-VILANTEROL 100-25 MCG/INH IN AEPB: 1.0000 | INHALATION_SPRAY | Freq: Every day | RESPIRATORY_TRACT | Status: DC

## 2016-02-17 NOTE — Telephone Encounter (Signed)
Spoke with pt and advised that samples of Breo were left at front desk.

## 2016-03-03 DIAGNOSIS — E782 Mixed hyperlipidemia: Secondary | ICD-10-CM | POA: Diagnosis not present

## 2016-03-03 DIAGNOSIS — Z0000 Encounter for general adult medical examination without abnormal findings: Secondary | ICD-10-CM | POA: Diagnosis not present

## 2016-03-03 DIAGNOSIS — Z6821 Body mass index (BMI) 21.0-21.9, adult: Secondary | ICD-10-CM | POA: Diagnosis not present

## 2016-03-03 DIAGNOSIS — Z1389 Encounter for screening for other disorder: Secondary | ICD-10-CM | POA: Diagnosis not present

## 2016-03-03 DIAGNOSIS — Z0001 Encounter for general adult medical examination with abnormal findings: Secondary | ICD-10-CM | POA: Diagnosis not present

## 2016-03-03 DIAGNOSIS — I10 Essential (primary) hypertension: Secondary | ICD-10-CM | POA: Diagnosis not present

## 2016-03-19 ENCOUNTER — Telehealth: Payer: Self-pay | Admitting: Pulmonary Disease

## 2016-03-19 NOTE — Telephone Encounter (Signed)
Spoke with pt. Advised him that we have samples for him to pick up. I have also placed patient assistance forms for the pt to pick up. He calls several times a month for these samples. States that he can't afford the medication. Nothing further was needed.

## 2016-03-22 ENCOUNTER — Telehealth: Payer: Self-pay | Admitting: Pulmonary Disease

## 2016-03-22 MED ORDER — FLUTICASONE FUROATE-VILANTEROL 100-25 MCG/INH IN AEPB: 1.0000 | INHALATION_SPRAY | Freq: Every day | RESPIRATORY_TRACT | Status: DC

## 2016-03-22 NOTE — Telephone Encounter (Signed)
Pt last seen by TP on 12-15-15---told to continue Breo 100. Rx has been sent to pharmacy and patient is aware.

## 2016-06-15 ENCOUNTER — Ambulatory Visit (INDEPENDENT_AMBULATORY_CARE_PROVIDER_SITE_OTHER): Payer: Medicare Other | Admitting: Adult Health

## 2016-06-15 ENCOUNTER — Encounter: Payer: Self-pay | Admitting: Adult Health

## 2016-06-15 DIAGNOSIS — J9611 Chronic respiratory failure with hypoxia: Secondary | ICD-10-CM | POA: Diagnosis not present

## 2016-06-15 DIAGNOSIS — J441 Chronic obstructive pulmonary disease with (acute) exacerbation: Secondary | ICD-10-CM | POA: Diagnosis not present

## 2016-06-15 MED ORDER — DOXYCYCLINE HYCLATE 100 MG PO TABS: 100.0000 mg | ORAL_TABLET | Freq: Two times a day (BID) | ORAL | 0 refills | Status: AC

## 2016-06-15 NOTE — Progress Notes (Signed)
Subjective:    Patient ID: [name], male    DOB: 1936-05-17, 80 y.o.   MRN: [id number]  HPI  PMD - [name]   79/M, ex- smoker- severe COPD on nocturnal O2 since 02/2010 & cough.   He developed a chronic cough.since his heart surgery by [name] in 6/09  Barrett's esophagus 3 cm noted to be stable by [name].  H/o sinus surgery, tonsillectomy   06/15/2016 Follow up : COPD  Pt Returns for a six-month follow-up for COPD.  Complains over last 2 week has nasal congestion, increased cough with thick mucus and more dyspnea.  Using OTC cold meds without much benefit.  Remains on BREO .  No longer taking Spiriva , did not notice any difference off it.  Pneumovax and Prevnar utd.  Remains on Oxygen 3l/m .  CXR 03/2015 with chronic changes.  Wt is stable, appetite is good.  He denies chest pain, orthopnea, edema or fever. , hemoptysis.    Significant tests/ events  02/2009 PFTs >> severe obstruction, FEV1 39%, no BD response  CT chest 01/2008 severe emphysema, scattered sub-cm nodules stable since 2005  CT chest 10/2010 neg PE, stable nodules, small RLL infx Rxed for HCAP with cipro & zosyn  Modified Ba swallow nml  Ct angio 2/12 did not show any suspicious nodules  11/04/2011 Started on Daliresp by Primary MD - stopped 4/13, makes him 'mean' , lost wt ,  4/13 ono on RA showed only 2 min desatn < 88% - off O2  06/2013 - Uses O2 at night, does not need much in daytime  07/05/13  - graduated from pulm rehab   Past Medical History:  Diagnosis Date  . Barrett's esophagus    frequent surveillance EGDs by Dr. Jena Gauss  . Chronic kidney disease, stage III (moderate)   . Chronic obstructive pulmonary disease (HCC)    severe emphysema  . Colonic polyp    resected 2007 via colonoscope  . COPD (chronic obstructive pulmonary disease) (HCC)   . Coronary artery disease    CABG in 03/2008 for severe left main disease with critical RCA stenosis; EF of 45-50% with inferior wall hypokinesis;  echocardiogram in 10/2008-no change in LV function; stress nuclear-conflicting information in report indicating inferior reversibility but inferior scar  . Cough, persistent   . Diverticulosis   . Gastroesophageal reflux disease   . History of orthostatic hypotension    noted following CABG surgery  . Hyperkalemia    peak of 6.2 in 2009  . Hyperlipidemia    excellent control  . Hypertension   . Peptic ulcer disease    perforation and surgical repair at [facility] in 1999; treated for positive Helicobacter pylori  . Pneumonia    10/2010   Current Outpatient Prescriptions on File Prior to Visit  Medication Sig Dispense Refill  . acetaminophen (TYLENOL) 500 MG tablet Take 500 mg by mouth every 6 (six) hours as needed for pain.    Marland Kitchen albuterol (PROVENTIL HFA;VENTOLIN HFA) 108 (90 BASE) MCG/ACT inhaler Inhale 2 puffs into the lungs every 6 (six) hours as needed for wheezing or shortness of breath.    Marland Kitchen albuterol (PROVENTIL) (2.5 MG/3ML) 0.083% nebulizer solution Take 3 mLs (2.5 mg total) by nebulization every 6 (six) hours. 75 mL 12  . ALPRAZolam (XANAX) 0.25 MG tablet Take 1 tablet (0.25 mg total) by mouth at bedtime as needed for sleep. 30 tablet 0  . amLODipine (NORVASC) 10 MG tablet Take 5 mg by mouth daily.     Marland Kitchen  aspirin 81 MG tablet Take 81 mg by mouth daily.    . fluticasone furoate-vilanterol (BREO ELLIPTA) 100-25 MCG/INH AEPB Inhale 1 puff into the lungs daily. 1 each 6  . furosemide (LASIX) 20 MG tablet Take 1 tablet by mouth daily.     Marland Kitchen. guaiFENesin (MUCINEX) 600 MG 12 hr tablet Take 2 tablets (1,200 mg total) by mouth 2 (two) times daily. 14 tablet 0  . KLOR-CON M10 10 MEQ tablet Take 1 tablet by mouth daily.    . Omega-3 Fatty Acids (EQL FISH OIL) 1000 MG CAPS Take 2 capsules (2,000 mg total) by mouth daily. 60 capsule 3  . omeprazole (PRILOSEC) 20 MG capsule Take 20 mg by mouth daily. 30 minutes before breakfast    . PARoxetine (PAXIL) 20 MG tablet Take 20 mg by mouth daily.  10  .  polyethylene glycol (MIRALAX / GLYCOLAX) packet Take 17 g by mouth daily. 14 each 0  . pravastatin (PRAVACHOL) 80 MG tablet Take 80 mg by mouth at bedtime.    Marland Kitchen. zolpidem (AMBIEN) 5 MG tablet Take 5 mg by mouth at bedtime as needed. Reported on 12/15/2015  2  . docusate sodium (COLACE) 100 MG capsule Take 200 mg by mouth at bedtime as needed for constipation.     Marland Kitchen. HYDROcodone-acetaminophen (NORCO/VICODIN) 5-325 MG per tablet Take 2 tablets by mouth every 4 (four) hours as needed. (Patient not taking: Reported on 06/15/2016) 10 tablet 0  . Tiotropium Bromide Monohydrate (SPIRIVA RESPIMAT) 2.5 MCG/ACT AERS Inhale 2 puffs into the lungs daily. (Patient not taking: Reported on 06/15/2016) 1 Inhaler 2   No current facility-administered medications on file prior to visit.       Review of Systems  neg for any significant sore throat, dysphagia, itching, sneezing, nasal congestion or excess/ purulent secretions, fever, chills, sweats, unintended wt loss, pleuritic or exertional cp, hempoptysis, orthopnea pnd or change in chronic leg swelling. Also denies presyncope, palpitations, heartburn, abdominal pain, nausea, vomiting, diarrhea or change in bowel or urinary habits, dysuria,hematuria, rash, arthralgias, visual complaints, headache, numbness weakness or ataxia.     Objective:   Physical Exam  Vitals:   06/15/16 1632  BP: 134/74  Pulse: 81  SpO2: 93%  Weight: 144 lb (65.3 kg)  Height: 5\' 9"  (1.753 m)    Gen. Pleasant, elderly  in no distress ENT - no lesions, no post nasal drip Neck: No JVD, no thyromegaly, no carotid bruits Lungs: no use of accessory muscles, no dullness to percussion, few scattered rhonchi  Cardiovascular: Rhythm regular, heart sounds  normal, no murmurs or gallops, no peripheral edema Musculoskeletal: No deformities, no cyanosis or clubbing    Aissata Wilmore NP-C  [facility]  06/15/2016

## 2016-06-15 NOTE — Assessment & Plan Note (Signed)
Flare   Plan  Patient Instructions  Doxycycline 100mg  Twice daily  For 7 days  Mucinex DM Twice daily  As needed  Cough/congesiton  Continue on BREO 1 puff daily , rinse after use.  Continue on Oxygen .  Please contact office for sooner follow up if symptoms do not improve or worsen or seek emergency care  Follow up Dr. Vassie LollAlva  6 months and  As needed

## 2016-06-15 NOTE — Assessment & Plan Note (Signed)
Cont on O2   Plan  Patient Instructions  Doxycycline 100mg  Twice daily  For 7 days  Mucinex DM Twice daily  As needed  Cough/congesiton  Continue on BREO 1 puff daily , rinse after use.  Continue on Oxygen .  Please contact office for sooner follow up if symptoms do not improve or worsen or seek emergency care  Follow up Dr. Vassie LollAlva  6 months and  As needed

## 2016-06-15 NOTE — Patient Instructions (Addendum)
Doxycycline 100mg  Twice daily  For 7 days  Mucinex DM Twice daily  As needed  Cough/congesiton  Continue on BREO 1 puff daily , rinse after use.  Continue on Oxygen .  Please contact office for sooner follow up if symptoms do not improve or worsen or seek emergency care  Follow up Dr. Vassie LollAlva  6 months and  As needed

## 2016-06-15 NOTE — Addendum Note (Signed)
Addended by: Karalee HeightOX, Haeleigh Streiff P on: 06/15/2016 04:52 PM   Modules accepted: Orders

## 2016-06-17 NOTE — Progress Notes (Signed)
Reviewed & agree with plan  

## 2016-07-21 DIAGNOSIS — Z23 Encounter for immunization: Secondary | ICD-10-CM | POA: Diagnosis not present

## 2016-09-06 DIAGNOSIS — Z1389 Encounter for screening for other disorder: Secondary | ICD-10-CM | POA: Diagnosis not present

## 2016-09-06 DIAGNOSIS — Z6820 Body mass index (BMI) 20.0-20.9, adult: Secondary | ICD-10-CM | POA: Diagnosis not present

## 2016-09-06 DIAGNOSIS — J9610 Chronic respiratory failure, unspecified whether with hypoxia or hypercapnia: Secondary | ICD-10-CM | POA: Diagnosis not present

## 2016-09-06 DIAGNOSIS — I2510 Atherosclerotic heart disease of native coronary artery without angina pectoris: Secondary | ICD-10-CM | POA: Diagnosis not present

## 2016-09-06 DIAGNOSIS — J449 Chronic obstructive pulmonary disease, unspecified: Secondary | ICD-10-CM | POA: Diagnosis not present

## 2016-09-13 DIAGNOSIS — I10 Essential (primary) hypertension: Secondary | ICD-10-CM | POA: Diagnosis not present

## 2016-09-13 DIAGNOSIS — E871 Hypo-osmolality and hyponatremia: Secondary | ICD-10-CM | POA: Diagnosis not present

## 2016-09-13 DIAGNOSIS — J439 Emphysema, unspecified: Secondary | ICD-10-CM | POA: Diagnosis not present

## 2016-09-13 DIAGNOSIS — N182 Chronic kidney disease, stage 2 (mild): Secondary | ICD-10-CM | POA: Diagnosis not present

## 2016-09-13 DIAGNOSIS — E875 Hyperkalemia: Secondary | ICD-10-CM | POA: Diagnosis not present

## 2016-09-13 DIAGNOSIS — I429 Cardiomyopathy, unspecified: Secondary | ICD-10-CM | POA: Diagnosis not present

## 2016-11-13 ENCOUNTER — Other Ambulatory Visit: Payer: Self-pay | Admitting: Pulmonary Disease

## 2017-02-14 ENCOUNTER — Other Ambulatory Visit: Payer: Self-pay

## 2017-02-14 MED ORDER — FLUTICASONE FUROATE-VILANTEROL 100-25 MCG/INH IN AEPB: 1.0000 | INHALATION_SPRAY | Freq: Every day | RESPIRATORY_TRACT | 1 refills | Status: AC

## 2017-03-13 ENCOUNTER — Other Ambulatory Visit: Payer: Self-pay | Admitting: Pulmonary Disease

## 2017-07-25 ENCOUNTER — Other Ambulatory Visit: Payer: Self-pay | Admitting: Pulmonary Disease

## 2017-08-13 ENCOUNTER — Other Ambulatory Visit: Payer: Self-pay | Admitting: Pulmonary Disease

## 2017-08-18 DIAGNOSIS — Z0001 Encounter for general adult medical examination with abnormal findings: Secondary | ICD-10-CM | POA: Diagnosis not present

## 2017-08-18 DIAGNOSIS — Z1389 Encounter for screening for other disorder: Secondary | ICD-10-CM | POA: Diagnosis not present

## 2017-08-18 DIAGNOSIS — Z681 Body mass index (BMI) 19 or less, adult: Secondary | ICD-10-CM | POA: Diagnosis not present

## 2017-08-18 DIAGNOSIS — G4701 Insomnia due to medical condition: Secondary | ICD-10-CM | POA: Diagnosis not present

## 2017-08-18 DIAGNOSIS — E785 Hyperlipidemia, unspecified: Secondary | ICD-10-CM | POA: Diagnosis not present

## 2017-08-18 DIAGNOSIS — Z0000 Encounter for general adult medical examination without abnormal findings: Secondary | ICD-10-CM | POA: Diagnosis not present

## 2017-08-18 DIAGNOSIS — Z23 Encounter for immunization: Secondary | ICD-10-CM | POA: Diagnosis not present

## 2018-08-21 DIAGNOSIS — Z6822 Body mass index (BMI) 22.0-22.9, adult: Secondary | ICD-10-CM | POA: Diagnosis not present

## 2018-08-21 DIAGNOSIS — E441 Mild protein-calorie malnutrition: Secondary | ICD-10-CM | POA: Diagnosis not present

## 2018-08-21 DIAGNOSIS — Z1389 Encounter for screening for other disorder: Secondary | ICD-10-CM | POA: Diagnosis not present

## 2018-08-21 DIAGNOSIS — Z0001 Encounter for general adult medical examination with abnormal findings: Secondary | ICD-10-CM | POA: Diagnosis not present

## 2018-08-21 DIAGNOSIS — Z0000 Encounter for general adult medical examination without abnormal findings: Secondary | ICD-10-CM | POA: Diagnosis not present

## 2018-08-21 DIAGNOSIS — E785 Hyperlipidemia, unspecified: Secondary | ICD-10-CM | POA: Diagnosis not present

## 2018-08-21 DIAGNOSIS — Z23 Encounter for immunization: Secondary | ICD-10-CM | POA: Diagnosis not present

## 2018-08-21 DIAGNOSIS — J449 Chronic obstructive pulmonary disease, unspecified: Secondary | ICD-10-CM | POA: Diagnosis not present

## 2019-09-20 DIAGNOSIS — M546 Pain in thoracic spine: Secondary | ICD-10-CM | POA: Diagnosis not present

## 2019-09-20 DIAGNOSIS — Z0001 Encounter for general adult medical examination with abnormal findings: Secondary | ICD-10-CM | POA: Diagnosis not present

## 2019-09-20 DIAGNOSIS — R109 Unspecified abdominal pain: Secondary | ICD-10-CM | POA: Diagnosis not present

## 2019-09-20 DIAGNOSIS — Z0000 Encounter for general adult medical examination without abnormal findings: Secondary | ICD-10-CM | POA: Diagnosis not present

## 2019-09-20 DIAGNOSIS — Z6823 Body mass index (BMI) 23.0-23.9, adult: Secondary | ICD-10-CM | POA: Diagnosis not present

## 2019-09-20 DIAGNOSIS — Z23 Encounter for immunization: Secondary | ICD-10-CM | POA: Diagnosis not present

## 2019-09-20 DIAGNOSIS — E663 Overweight: Secondary | ICD-10-CM | POA: Diagnosis not present

## 2019-09-20 DIAGNOSIS — Z1389 Encounter for screening for other disorder: Secondary | ICD-10-CM | POA: Diagnosis not present

## 2020-01-17 DEATH — deceased
